# Patient Record
Sex: Male | Born: 1935 | Race: White | Hispanic: No | Smoking: Former smoker
Health system: Southern US, Community
[De-identification: ages and names within clinical notes are randomized; demographics above are authoritative.]

## PROBLEM LIST (undated history)

## (undated) DIAGNOSIS — S73032A Other anterior subluxation of left hip, initial encounter: Secondary | ICD-10-CM

## (undated) DIAGNOSIS — R413 Other amnesia: Secondary | ICD-10-CM

## (undated) DIAGNOSIS — Z8546 Personal history of malignant neoplasm of prostate: Secondary | ICD-10-CM

## (undated) DIAGNOSIS — M1990 Unspecified osteoarthritis, unspecified site: Secondary | ICD-10-CM

## (undated) DIAGNOSIS — I209 Angina pectoris, unspecified: Secondary | ICD-10-CM

## (undated) DIAGNOSIS — Z951 Presence of aortocoronary bypass graft: Secondary | ICD-10-CM

## (undated) DIAGNOSIS — F419 Anxiety disorder, unspecified: Secondary | ICD-10-CM

## (undated) DIAGNOSIS — D375 Neoplasm of uncertain behavior of rectum: Secondary | ICD-10-CM

## (undated) DIAGNOSIS — H9190 Unspecified hearing loss, unspecified ear: Secondary | ICD-10-CM

## (undated) DIAGNOSIS — E785 Hyperlipidemia, unspecified: Secondary | ICD-10-CM

## (undated) DIAGNOSIS — I2510 Atherosclerotic heart disease of native coronary artery without angina pectoris: Principal | ICD-10-CM

## (undated) HISTORY — PX: CORONARY ARTERY BYPASS GRAFT: SHX141

## (undated) HISTORY — DX: Presence of aortocoronary bypass graft: Z95.1

## (undated) HISTORY — DX: Neoplasm of uncertain behavior of rectum: D37.5

## (undated) HISTORY — PX: COLONOSCOPY: SHX174

## (undated) HISTORY — PX: OTHER SURGICAL HISTORY: SHX169

## (undated) HISTORY — DX: Hyperlipidemia, unspecified: E78.5

## (undated) HISTORY — PX: BACK SURGERY: SHX140

## (undated) HISTORY — DX: Atherosclerotic heart disease of native coronary artery without angina pectoris: I25.10

## (undated) HISTORY — PX: HERNIA REPAIR: SHX51

## (undated) HISTORY — PX: PROSTATE CRYOABLATION: SUR358

## (undated) HISTORY — DX: Anxiety disorder, unspecified: F41.9

## (undated) HISTORY — PX: HIP SURGERY: SHX245

## (undated) HISTORY — PX: NASAL SEPTUM SURGERY: SHX37

## (undated) HISTORY — DX: Personal history of malignant neoplasm of prostate: Z85.46

---

## 1998-08-20 ENCOUNTER — Ambulatory Visit (HOSPITAL_COMMUNITY): Admission: RE | Admit: 1998-08-20 | Discharge: 1998-08-20 | Payer: Self-pay | Admitting: Cardiology

## 1998-09-04 ENCOUNTER — Encounter: Payer: Self-pay | Admitting: Cardiothoracic Surgery

## 1998-09-07 ENCOUNTER — Encounter: Payer: Self-pay | Admitting: Cardiothoracic Surgery

## 1998-09-07 ENCOUNTER — Inpatient Hospital Stay (HOSPITAL_COMMUNITY): Admission: RE | Admit: 1998-09-07 | Discharge: 1998-09-11 | Payer: Self-pay | Admitting: Cardiothoracic Surgery

## 1998-09-08 ENCOUNTER — Encounter: Payer: Self-pay | Admitting: Cardiothoracic Surgery

## 1998-09-09 ENCOUNTER — Encounter: Payer: Self-pay | Admitting: Cardiothoracic Surgery

## 2002-09-24 ENCOUNTER — Encounter: Payer: Self-pay | Admitting: Internal Medicine

## 2002-09-24 ENCOUNTER — Encounter: Admission: RE | Admit: 2002-09-24 | Discharge: 2002-09-24 | Payer: Self-pay | Admitting: Internal Medicine

## 2003-01-29 ENCOUNTER — Encounter: Admission: RE | Admit: 2003-01-29 | Discharge: 2003-01-29 | Payer: Self-pay | Admitting: Internal Medicine

## 2003-01-29 ENCOUNTER — Encounter: Payer: Self-pay | Admitting: Internal Medicine

## 2003-09-05 ENCOUNTER — Encounter: Admission: RE | Admit: 2003-09-05 | Discharge: 2003-09-05 | Payer: Self-pay | Admitting: Endocrinology

## 2004-03-04 ENCOUNTER — Inpatient Hospital Stay (HOSPITAL_COMMUNITY): Admission: EM | Admit: 2004-03-04 | Discharge: 2004-03-06 | Payer: Self-pay | Admitting: Emergency Medicine

## 2005-07-04 HISTORY — PX: PARTIAL COLECTOMY: SHX5273

## 2005-07-16 ENCOUNTER — Inpatient Hospital Stay (HOSPITAL_COMMUNITY): Admission: EM | Admit: 2005-07-16 | Discharge: 2005-07-29 | Payer: Self-pay | Admitting: Emergency Medicine

## 2005-08-11 ENCOUNTER — Encounter: Admission: RE | Admit: 2005-08-11 | Discharge: 2005-08-11 | Payer: Self-pay | Admitting: Cardiology

## 2005-08-25 ENCOUNTER — Encounter (HOSPITAL_COMMUNITY): Admission: RE | Admit: 2005-08-25 | Discharge: 2005-09-29 | Payer: Self-pay | Admitting: Cardiology

## 2005-12-26 ENCOUNTER — Encounter (INDEPENDENT_AMBULATORY_CARE_PROVIDER_SITE_OTHER): Payer: Self-pay | Admitting: *Deleted

## 2005-12-26 ENCOUNTER — Inpatient Hospital Stay (HOSPITAL_COMMUNITY): Admission: RE | Admit: 2005-12-26 | Discharge: 2005-12-30 | Payer: Self-pay | Admitting: General Surgery

## 2007-10-24 ENCOUNTER — Ambulatory Visit (HOSPITAL_BASED_OUTPATIENT_CLINIC_OR_DEPARTMENT_OTHER): Admission: RE | Admit: 2007-10-24 | Discharge: 2007-10-24 | Payer: Self-pay | Admitting: Urology

## 2007-12-20 ENCOUNTER — Ambulatory Visit (HOSPITAL_COMMUNITY): Admission: RE | Admit: 2007-12-20 | Discharge: 2007-12-20 | Payer: Self-pay | Admitting: Gastroenterology

## 2010-04-22 ENCOUNTER — Ambulatory Visit (HOSPITAL_COMMUNITY)
Admission: RE | Admit: 2010-04-22 | Discharge: 2010-04-23 | Payer: Self-pay | Source: Home / Self Care | Admitting: Cardiology

## 2010-05-20 ENCOUNTER — Ambulatory Visit: Payer: Self-pay | Admitting: Cardiothoracic Surgery

## 2010-05-25 ENCOUNTER — Encounter (HOSPITAL_COMMUNITY)
Admission: RE | Admit: 2010-05-25 | Discharge: 2010-06-18 | Payer: Self-pay | Source: Home / Self Care | Attending: Cardiology | Admitting: Cardiology

## 2010-07-25 ENCOUNTER — Encounter: Payer: Self-pay | Admitting: Cardiothoracic Surgery

## 2010-09-15 LAB — CBC
Hemoglobin: 12.3 g/dL — ABNORMAL LOW (ref 13.0–17.0)
MCH: 30.1 pg (ref 26.0–34.0)
RBC: 4.09 MIL/uL — ABNORMAL LOW (ref 4.22–5.81)
WBC: 4.7 10*3/uL (ref 4.0–10.5)

## 2010-09-15 LAB — BASIC METABOLIC PANEL
GFR calc Af Amer: >60 mL/min (ref >60)
GFR calc non Af Amer: 56 mL/min — ABNORMAL LOW (ref >60)

## 2010-11-16 NOTE — Assessment & Plan Note (Signed)
OFFICE VISIT   [name], [name]  DOB:  1926/11/10                                        May 21, 2010  CHART #:  [id number]   HISTORY:  The patient comes to the office today at the request of Dr.  Donnie Aho and Dr. Verdis Prime for evaluation of progressive coronary  occlusive disease.  The patient is an 77 year old male who in 2000 had  coronary artery bypass grafting and did well until 2007, when he  underwent redo coronary artery bypass grafting x3 with a sequential  reverse saphenous vein graft to the posterior descending and  posterolateral branches of the right coronary artery and reverse  saphenous vein graft to the intermediate coronary artery with left leg  endovein harvesting.  His initial operation was performed on September 07, 1998, and at that time, he had coronary artery bypass grafting x4 with  left internal mammary to the left anterior descending coronary artery,  reverse saphenous vein graft to the intermediate coronary artery,  sequential reverse saphenous vein graft to the posterior descending and  posterolateral branches of the right coronary artery with endovein  harvesting from the right thigh.  In October, he presented with  increasing chest pain and underwent repeat intervention by Dr. Katrinka Blazing  including angioplasty of some narrowing in the vein to the intermediate.  He had lost the segment of graft between the PD and the PL and had a  narrowing at the anastomosis of the vein graft to the PD.  Both the  distal vessel and the posterolateral are relatively small diffusely  diseased vessels.  No intervention was undertaken for technical reasons  on the right vein graft.  He has been followed by Dr. Donnie Aho, who is  working on maximizing his medical therapy, and comes to see me today to  discuss the risks and options involved in the third time redo.  He notes  he does not have chest pain, but appears that his anginal equivalent is  pain in  both shoulders usually with exercise.  At times, limiting and  other times, he is able to in fact yesterday climb on his roof and clean  his gutters.   PAST MEDICAL HISTORY:  As noted above, myocardial infarction in January  2007; coronary artery disease as noted above; history of hyperlipidemia;  glaucoma; history of prostate cancer, unknown stage; dysphagia with  history of food impactions; adenomatous polyps; history of colon cancer  requiring resection; and hemorrhoidectomy.   FAMILY AND SOCIAL HISTORY:  The patient's father died at 96 with  prostate cancer, mother died at 73 of lung cancer, one brother died at  52 of CVA, one sister at 35 is alive.  The patient has a history of  smoking, but has not smoked for many years.  Rare use of alcohol.  He is  married and with supportive wife.   ALLERGIES:  All statins cause muscle cramps and is unable to tolerate.   REVIEW OF SYSTEMS:  Denies fever, chills, or night sweats.  Denies  claudication.  Denies edema.  Denies orthopnea.  Denies GI bleeding.  Denies dyspnea on exertion or hemoptysis.  Denies any joint difficulty.   PHYSICAL EXAMINATION:  Vital Signs:  His blood pressure 116/64, pulse 58  and regular, respiratory rate is 16, and O2 sats 99%.  BMI  is 28.  General:  The patient appears younger than his stated age.  He is awake,  alert, neurologically intact and able to relate his history in good  detail.  He is in no distress in the office.  Neck:  I do not appreciate  any carotid bruits.  Cardiac:  Regular rate and rhythm without murmur or  gallop.  Abdomen:  Benign without palpable masses.  Extremities:  He has  endovein harvest incisions involving both thighs, appears to have  adequate vein in both lower extremities that could be used for bypass.   I have reviewed the current coronary angiogram films and discussed them  with both Dr. Katrinka Blazing and Dr. Donnie Aho, is native.  A good result was  obtained from the dilatation of the vein  graft to the intermediate.  He  has severe diffuse native coronary disease.  The internal mammary is  widely patent to the LAD.  As described, the vein graft to the right is  widely patent until it gets to the anastomosis site with a small  posterior descending branch where it narrows and then the sequential  segment of the graft is occluded to the posterolateral.  There is  filling antegrade and retrograde through the PD and PL.  The native  right has 80-90% proximal stenosis, impedes several acute marginals.   IMPRESSION:  The patient with progressive vein graft and native coronary  disease for consideration of third time redo, also a history of  dyslipidemia, but unable to tolerate statins.  After reviewing the films  and discussing with Dr. Donnie Aho and Dr. Katrinka Blazing, a third time redo would  carry significant risk because of the small and diffuse disease both in  the PD and the PL.  The technical outcome of the third time redo surgery  may be less than optimal.  It does not appear that angioplasty or  stenting of the anastomotic site to the PD would have the same  limitations.  I agree with Dr. Katrinka Blazing and Dr. Donnie Aho that at this point,  the best treatment for the patient would be to maximize his medical  therapy.  He has just started on nitrates, although the results with  Imdur are not ideal that could also be a potential benefit.  Only if he  completely fails medical therapy, the next approach could be to consider  angioplasty on the native right coronary artery, but the appearance of  this would not be an ideal situation and I am not sure how much it would  help even if it was carried out.  I will keep in contact with Dr. Donnie Aho  about the patient and his progress  with continue with medical therapy.  I have discussed the risks and  options with the patient in detail and he is willing to proceed in this  manner.   Sheliah Plane, MD  Electronically Signed   [name]  D:  05/21/2010   T:  05/22/2010  Job:  [id number]   cc:   Lyn Records, M.D.  Georga Hacking, M.D.  Gwen Pounds, MD

## 2010-11-16 NOTE — Op Note (Signed)
NAME:  [name], [name]                     ACCOUNT NO.:  [id number]   MEDICAL RECORD NO.:  [id number]          PATIENT TYPE:  AMB   LOCATION:  [facility]                         FACILITY:  [facility]   PHYSICIAN:  Sigmund I. Patsi Sears, M.D.DATE OF BIRTH:  [birth year]   DATE OF PROCEDURE:  10/24/2007  DATE OF DISCHARGE:                               OPERATIVE REPORT   PREOPERATIVE DIAGNOSIS:  T1C adenocarcinoma of the prostate.   POSTOPERATIVE DIAGNOSIS:  T1C adenocarcinoma of the prostate.   OPERATION:  Cryotherapy of the prostate.   SURGEON:  Sigmund I. Patsi Sears, M.D.   ANESTHESIA:  General LMA and suprapubic tube insertion.   PREPARATION:  After appropriate preanesthesia, the patient is brought to  the operating room, placed on the operating table in the dorsal supine  position where general LMA anesthesia was introduced.  He was then  replaced in dorsal lithotomy position where the pubis was prepped with  Betadine solution and draped in usual fashion.   BRIEF HISTORY:  This 77 year old male was found to have T1C  adenocarcinoma of the prostate, Gleason 3 + 3 in the right lateral  prostate, and Gleason 3 + 3 at the apex.  The gland was shrunk from 73  mL to 50 mL, and after all options were discussed, the patient  selected  cryotherapy as treatment of choice.   PROCEDURE:  Cystourethroscopy was accomplished while the patient is in  dorsal lithotomy position, and the penis is prepped with Betadine  solution and draped in usual fashion.  The bladder was filled with  fluid.  There is no evidence of abnormality of the prostate.  There is  minimal trabeculation, no cellule formation.  There is no evidence of  bladder stone or tumor formation within the bladder.  Clear reflux is  seen from both orifices, which are located on a normally positioned  trigone.   With the bladder with approximately 40 mL of sterile water within it,  and the patient in Trendelenburg position, a 5 mm incision is made  in  the suprapubic area, midline, between the umbilicus in the pubis.  Subcutaneous tissue was dissected with a Kelly clamp, and suprapubic  tube is placed without difficulty.  Cystoscopy shows that the curl was  in the right position  at the dome of the bladder.  The catheter is both secured in place with  the suture on the catheter itself, as well as with the special plastic  ring, placed at the base of the catheter.   Foley catheter is placed with 10 mL in the balloon, and the scrotum was  prepped and draped, and draped out of the field.   The prostate ultrasound probe was then placed, and needle placement was  then accomplished, placed with placement of cryoprobes in three rows.  In addition, probes were placed at the external sphincter, and  Denonvilliers' fascia.  Following this, the patient underwent two freeze-  thaw cycles, with attention to temperature at the sphincter, and also  the Denonvilliers' fascia probe.  Following two freeze-thaw cycles, the  probes were removed.  It is noted that a urethral warming device was  placed after the needles placed, and after cystoscopy showed that there  were no needles within the prostatic fossa or the bladder.  A guidewire  was placed, and the urethral warming device was placed over the  guidewire.  This was all accomplished prior to the first freeze-thaw  cycle.   The urethral warming mechanism was left in place for 20 minutes after  the final thaw, and the patient was given a B & O suppository, IV  Toradol, awakened and taken to the recovery room in good condition.      Sigmund I. Patsi Sears, M.D.  Electronically Signed     SIT/MEDQ  D:  10/24/2007  T:  10/24/2007  Job:  [id number]   cc:   Gwen Pounds, MD  Fax: [phone]

## 2010-11-19 NOTE — Cardiovascular Report (Signed)
[name], [name]                     ACCOUNT NO.:  [id number]   MEDICAL RECORD NO.:  [id number]          PATIENT TYPE:  INP   LOCATION:  2926                         FACILITY:  [facility]   PHYSICIAN:  Georga Hacking, M.D.DATE OF BIRTH:  1927/02/14   DATE OF PROCEDURE:  07/18/2005  DATE OF DISCHARGE:                              CARDIAC CATHETERIZATION   PROCEDURE:  1.  Cardiac catheterization with coronary angiograms.  2.  Left ventriculogram.  3.  angiograms of the saphenous vein grafts and internal mammary artery.   HISTORY:  A 77 year old male with previous coronary bypass grafting in March  of 2000.  He had a CYPHER stent of the distal saphenous vein graft to the  right coronary artery in September of 2000 and has done well until the past  couple of weeks when he has had recurrent shoulder discomfort. He had a  prolonged episode over the weekend and was placed on treatment with  Integrilin, Lovenox and is admitted to the hospital for further evaluation.   PROCEDURE:  The procedure was done through the right femoral artery using a  single anterior needle wall stick.  Angiograms were done using 6-French  catheters. The right coronary artery graft was selected using the standard  right catheter. The internal mammary and obtuse marginal graft were selected  using the internal mammary artery catheter.  A 30 mL ventriculogram was  performed.   Following the procedure a right femoral artery angiogram was performed  showing the artery to be suitable for percutaneous closure and this was done  with Angio-Seal without difficulty. He tolerated the procedure well.   HEMODYNAMIC DATA:  Aorta postcontrast 111/61, LV postcontrast 111/0 to 22.   ANGIOGRAPHIC DATA:  Left ventriculogram: Performed in the 30 degrees RAO  projection. The aortic valve was normal. The mitral valve was normal.  The  left ventricle appears normal in size. Estimated ejection fraction is 60%.  Coronary arteries rise to  normally. Calcifications noted involving the left  and the right coronary artery system. The left main coronary artery appeared  normal. The left anterior descending has severe disease proximally, has a  severe stenosis prior to the septal perforator and is then totally occluded.  The circumflex coronary artery has a large intermediate branch and has a  long segmental stenosis in its proximal portion. This vessel previously  filled by a patent saphenous vein graft. A marginal branch of the circumflex  arises and is somewhat small. The right coronary artery is occluded in its  mid portion after two acute marginal branches. The saphenous vein graft to  the right coronary artery that was previously patent is now occluded  proximally, much more proximal to the site of where the stent was put in.  The saphenous vein graft to the intermediate branch is now also completely  occluded. The internal mammary graft to LAD is widely patent.   IMPRESSION:  1.  Severe native three-vessel coronary artery disease.  2.  Interval occlusion of the saphenous vein graft to the right coronary      artery with  collateral filling from the native left coronary artery.  3.  Interval occlusion of saphenous vein graft to the intermediate branch.  4.  Patent internal mammary graft to LAD.  5.  Successful Angio-Seal closure of the right femoral artery.   RECOMMENDATIONS:  The patient will be considered for re-do coronary bypass  grafting.      Georga Hacking, M.D.  Electronically Signed     WST/MEDQ  D:  07/18/2005  T:  07/18/2005  Job:  [id number]   cc:   Dr. Dimitri Ped

## 2010-11-19 NOTE — Op Note (Signed)
NAMEASBURY, HAIR                     ACCOUNT NO.:  [id number]   MEDICAL RECORD NO.:  [id number]          PATIENT TYPE:  INP   LOCATION:  1415                         FACILITY:  [facility]   PHYSICIAN:  Angelia Mould. Derrell Lolling, M.D.DATE OF BIRTH:  March 20, 1927   DATE OF PROCEDURE:  12/26/2005  DATE OF DISCHARGE:                                 OPERATIVE REPORT   PREOPERATIVE DIAGNOSIS:  Villous adenoma of the ascending colon.   POSTOPERATIVE DIAGNOSIS:  Villous adenoma of the ascending colon.   OPERATION PERFORMED:  Laparoscopic-assisted right colectomy.   SURGEON:  Angelia Mould. Derrell Lolling, M.D.   FIRST ASSISTANT:  Ollen Gross. Carolynne Edouard, M.D.   OPERATIVE INDICATIONS:  This is a 77 year old white man with coronary artery  disease and hyperlipidemia.  He was noted have Hemoccult-positive stools and  a hemoglobin of between 10 and 11.  Dr. Matthias Hughs performed a colonoscopy and  he found a 5-cm diameter flat sessile villous adenoma in the mid ascending  colon.  Biopsies showed no dysplasia to the adenoma.  The adenoma was too  large for colonoscopic resection.  He had some other smaller polyps that  were removed.  He was offered elective colon resection for this lesion and  he agreed to that.  He has undergone a 2-day bowel prep at home.  He has  been on beta blockers for 5 days preop.  He is brought to the operating room  electively.   OPERATIVE FINDINGS:  In the right colon, I was able to visualize the [place]  ink tattoo in the proximal ascending colon.  Once I had the specimen out, I  could palpate a flat nodular polyp, which felt benign.  Once the specimen  was removed, it was sent to the lab and Dr. Jimmy Picket examined it grossly  and said that in the area of the [place] ink, he did see a residual sessile  adenoma and findings consistent with recent biopsy.  The liver looked  normal.  The gallbladder looked normal.  The appendix and terminal ileum  looked normal.  There was no ascites.   OPERATIVE TECHNIQUE:   Following the induction of general endotracheal  anesthesia, a Foley catheter was inserted.  Intravenous antibiotics were  given prior to the incision.  The abdomen was prepped and draped in a  sterile fashion.  0.5% Marcaine with epinephrine was used as a local  infiltration anesthetic.  A 10-mm Optivu port was placed in the left upper  quadrant without any difficulty.  Pneumoperitoneum was created.  The video  camera was inserted with visualization and findings, as described above.  Specifically, there was no trauma at the insertion site.  A 5-mm trocar was  placed in the midline, about 6 cm below the umbilicus.  A 5-mm trocar was  placed in the midline, just above the umbilicus, and ultimately, we had to  place another 5-mm trocar in the subxiphoid region.  We identified the  terminal ileum and the ileocecal valve.  Using the harmonic scalpel, we  mobilized the terminal ileum and appendix and cecum, mobilizing it  away from  the lateral abdominal wall by dividing the white line of Toldt.  We  continued this dissection by dividing all the lateral peritoneal  attachments, rotating the terminal ileum and right colon and hepatic flexure  medially.  We then took down the transverse colon.  There were moderate  adhesions of the transverse colon to the lower edge of the liver, but we  slowly took these down without any problem.  We were able to mobilize the  hepatic flexure and the right transverse colon down and off of the duodenum  under direct vision.  Once we had done all of this, we found that we had an  extremely mobile right colon.  There was no bleeding.  There was no injury  to the gallbladder or liver or duodenum.   The pneumoperitoneum was released.  We made about an 8-cm vertically-  oriented incision just above the umbilicus.  The fascia was incised in the  midline and the abdominal cavity entered under direct vision.  A wound  protector was placed in the wound.  The cecum was  delivered into the wound  and we were able to easily mobilize the terminal ileum and the right colon  and the transverse colon through this wound.  We were able to visualize the  [place] ink and placed a silk suture in that.  We transected the terminal  ileum with a GIA stapling device.  We placed stay sutures of 3-0 silk in the  terminal ileum to prevent twisting of the specimen.  We took down the  mesenteric vessels between clamps, either singly or doubly ligating  mesenteric vessels with 2-0 silk ties.  We took the dissection probably 6 cm  or more distal to the [place] ink, and then transected the right transverse  colon with the GIA stapling device.  We had one bleeder in the mesentery,  which was suture ligated with 2-0 silk suture ligature and this controlled  the bleeding quite nicely.  The specimen was sent to Dr. Jimmy Picket in the  lab and he opened it and grossly inspected it and found that the villous  adenoma was within the specimen with good margins.   Anastomosis was created between the terminal ileum and the mid transverse  colon using a GIA stapling device.  The bowel was placed side-to-side and  the stapler fired through enterotomies.  We then removed the stapler and  examined the lumen of the anastomosis and found that the tissues were quite  pink and viable and that there was no bleeding.  The defect in the bowel  wall was closed with a TA-60 stapler.   We then changed our instruments and gloves.  We inspected the anastomosis  and found it to be widely patent with a good-looking staple line all around.  We placed some additional sutures of 3-0 silk at the staple line in critical  points.  We did not close the mesentery, as we felt we were going to have to  pull on the specimen too hard for that.  We irrigated the specimen and then  dropped it back in the abdominal cavity.  We then looked around the abdominal cavity and saw no bleeding.  The midline fascia was closed with  a  running suture of #1 PDS, the wound irrigated, and the skin closed with skin  staples.  We re-insufflated the abdomen, inserted the video camera, and  looked around the pelvis, in the right paracolic gutter and the  liver, and  found no bleeding or any other abnormality.  The trocars were removed under  direct vision.  There was no bleeding from the trocar sites.  The  pneumoperitoneum was released.  The fascia at all of the wounds were closed,  as described above, and the skin incisions were closed with skin staples.  Clean bandages were placed and the patient was taken to the recovery room in  stable condition.  Estimated blood loss was probably about 50-70 mL.  Complications:  None.  Sponge and instrument counts were correct.      Angelia Mould. Derrell Lolling, M.D.  Electronically Signed     HMI/MEDQ  D:  12/26/2005  T:  12/26/2005  Job:  [id number]   cc:   Bernette Redbird, M.D.  Fax: [phone]   W. Viann Fish, M.D.  Fax: [phone]  Email: [email or web address]   Gwen Pounds, MD  Fax: [phone]

## 2010-11-19 NOTE — Discharge Summary (Signed)
NAMERICHRD, KUZNIAR                     ACCOUNT NO.:  [id number]   MEDICAL RECORD NO.:  [id number]          PATIENT TYPE:  INP   LOCATION:  1415                         FACILITY:  [facility]   PHYSICIAN:  Angelia Mould. Derrell Lolling, M.D.DATE OF BIRTH:  [birth year]   DATE OF ADMISSION:  12/26/2005  DATE OF DISCHARGE:  12/30/2005                                 DISCHARGE SUMMARY   FINAL DIAGNOSES:  1.  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  2.  Coronary artery disease status post coronary artery bypass grafting.  3.  Hyperlipidemia.   OPERATIONS PERFORMED:  Laparoscopic-assisted right colectomy date December 26, 2005.   HISTORY:  This is a 77 year old gentleman who was found to have hemoccult-  positive stools.  A colonoscopy was performed and somewhere in the mid  descending colon Dr. Matthias Hughs found a 5 cm flat sessile adenoma which could  not be removed colonoscopically.  The biopsy showed adenomatous change, but  no dysplasia.  The patient had no other symptoms.  The patient was referred  for segmental resection.  As an outpatient the patient was evaluated.  His  care was discussed Dr. Viann Fish who felt that he was fit and cleared  from a cardiac standpoint for the surgery.  He placed him on beta blockers  for five days preoperative.   PHYSICAL EXAMINATION:  GENERAL:  Pleasant older gentleman who appeared fit.  VITAL SIGNS:  Height 5 feet 8.  Weight 173.  LUNGS:  Clear.  NECK:  No adenopathy.  ABDOMEN:  Soft and nontender.  No palpable mass.  Liver and spleen not  enlarged.  No inguinal adenopathy.  No hernia.  EXTREMITIES:  Well-healed right groin scar from previous hernia.   HOSPITAL COURSE:  On the date of admission the patient was taken to the  operating room and underwent an uneventful laparoscopic right colectomy with  primary anastomosis.  The final pathology report showed tubular adenoma with  focal high-grade dysplasia.  14 lymph nodes were evaluated and all 14  were  benign.   Postoperatively the patient did well.  He  progressed in his diet and  activities as tolerated.  He was maintained on beta blockers and DVT  prophylaxis.  Hemoglobin on postoperative day one was 8.8 which was slightly  down from his preoperative hemoglobin of 10.5.  On the following days June  27 his hemoglobin had come back up to 9.4 and neither Dr. Donnie Aho nor I felt  that he needed a transfusion.  He mobilized well, had no symptoms, no  fatigue, no shortness of breath.  Progressed in his diet and activities, did  well, and was ready for discharge on December 30, 2005.  His pathology report  was discussed with him.  Roll of screening colonoscopy in the future was  discussed with him.  Diet and activities were discussed.  Staples were  removed from his wounds and Steri-Strips were applied.  He had  no wound problems.  He was told to continue his usual medications and to  continue taking Toprol per Dr.  Tilley's instructions and he was given  prescription for Vicodin for pain.  He was asked to follow up with me in the  office in 10-14 days.      Angelia Mould. Derrell Lolling, M.D.  Electronically Signed     HMI/MEDQ  D:  01/31/2006  T:  01/31/2006  Job:  [id number]   cc:   Georga Hacking, M.D.  Fax: [phone]  Email: [email or web address]   Bernette Redbird, M.D.  Fax: [phone]   Gwen Pounds, MD  Fax: [phone]

## 2010-11-19 NOTE — Op Note (Signed)
NAMEPERL, KERNEY                     ACCOUNT NO.:  [id number]   MEDICAL RECORD NO.:  [id number]          PATIENT TYPE:  INP   LOCATION:  2310                         FACILITY:  [facility]   PHYSICIAN:  Sheliah Plane, MD    DATE OF BIRTH:  [birth year]   DATE OF PROCEDURE:  07/25/2005  DATE OF DISCHARGE:                                 OPERATIVE REPORT   PREOPERATIVE DIAGNOSIS:  Recurrent coronary occlusive disease with unstable  angina.   POSTOPERATIVE DIAGNOSIS:  Recurrent coronary occlusive disease with unstable  angina.   SURGICAL PROCEDURE:  Redo coronary artery bypass grafting x3 with a  sequential reversed saphenous vein graft to the posterior descending and  posterolateral branches of the right coronary artery and reversed saphenous  vein graft to the intermediate coronary artery with left leg endo-vein  harvesting.   SURGEON:  Sheliah Plane, MD   FIRST ASSISTANT:  Constance Holster, [place]   BRIEF HISTORY:  The patient is a 77 year old male who 7 years previously had  undergone coronary artery bypass grafting and initially had done well, but  recently was readmitted with recurrent angina.  Repeat cardiac  catheterization demonstrated total occlusion of the vein graft to the PD and  PL, and occlusion of the vein graft to the intermediate.  The patient had a  small nondominant circumflex system that was not bypassed.  His mammary  artery was intact.  Ejection fraction was preserved.  The patient was on  Plavix and had fall in hematocrit presumably from GI bleeding; his Plavix  was held and he was allowed to wash out prior to proceeding with bypass  surgery.  The patient agreed and signed informed consent.   DESCRIPTION OF PROCEDURE:  With Swan-Ganz and arterial line monitors in  place, the patient underwent general endotracheal anesthesia without  incident.  Skin of the chest and legs was prepped with Betadine and draped  in the usual sterile manner.  Vein was harvested from the  left thigh with  Guidant endo-vein harvesting system and was of good quality and caliber.  A  median sternotomy was performed with a sagittal saw, taking care not to  injure any underlying structures.  The ascending aorta and the right atrium  were dissected out from dense adhesions.  The patient was systemically  heparinized.  The ascending aorta and the right atrium were cannulated.  A  retrograde cardioplegia catheter was placed.  The patient was placed on  cardiopulmonary bypass and the remainder of the left ventricle was dissected  out.  The mammary artery was identified and carefully preserved and  encircled to allow a bulldog clamp during crossclamp application.  The  patient's body temperature was cooled to 32 degrees.  An aortic cross-clamp  was applied and 500 mL of cold blood potassium cardioplegia was administered  antegrade.  A bulldog was placed on the mammary artery and additional cold  blood cardioplegia was administered retrograde.  Attention was turned first  to the intermediate coronary artery, which was high on the lateral wall and  partially intramyocardial.  The vessel  was identified, opened and admitted a  1.5-mm probe.  Using a running 7-0 Prolene, a distal anastomosis was  performed with a segment of reversed saphenous vein graft.  Attention was  then turned to the PD and PL; each of these were relatively small branches,  but were opened and using a diamond-type side-to-side anastomosis, a running  7-0 anastomosis was performed with the vein graft to the posterior  descending.  The distal extent of the same vein was then carried out to the  posterolateral branch of the right coronary artery, which was opened and was  also a small vessel, but admitted a 1.5-mm probe.  Using a running 7-0  Prolene, distal anastomosis was performed.  Additional cold blood  cardioplegia was administered down vein grafts.  With the crossclamp still  in place, 2 punch aortotomies were  performed, each in the hoods of the old  vein grafts.  Each of the proximal anastomoses were completed and before  closure of the final anastomosis, the heart was allowed to passively fill  and de-air the heart.  The aortic cross-clamp was removed with a total cross-  clamp time of 71 minutes.  Sites of anastomoses were inspected.  The  patient's body temperature had rewarmed to 37 degrees.  He was then  ventilated and weaned from cardiopulmonary bypass without difficulty.  He  remained hemodynamically stable.  He was decannulated in the usual fashion.  Total pump time was 108 minutes.  The atrial and ventricular pacing wires  were applied.  The mediastinal tissue was closed over the ascending aorta  and the right ventricle.  The sternum was closed with #6 stainless steel  wire.  Fascia was closed using interrupted 0 Vicryl, running 3-0 Vicryl in  the subcutaneous tissue and 4-0 subcuticular stitch in the skin edges.  Dry  dressings were applied.  Sponge and needle count was reported as correct at  completion of the procedure.  The patient tolerated the procedure without  obvious complication and was transferred to the surgical intensive care unit  for further postoperative care.      Sheliah Plane, MD  Electronically Signed     [name]  D:  07/27/2005  T:  07/27/2005  Job:  [id number]   cc:   Georga Hacking, M.D.  Fax: [phone]  Email: [email or web address]

## 2010-11-19 NOTE — H&P (Signed)
[name], [name]                           ACCOUNT NO.:  [id number]   MEDICAL RECORD NO.:  [id number]                   PATIENT TYPE:  EMS   LOCATION:  MAJO                                 FACILITY:  [facility]   PHYSICIAN:  Vesta Mixer, M.D.              DATE OF BIRTH:  07/18/1926   DATE OF ADMISSION:  03/04/2004  DATE OF DISCHARGE:                                HISTORY & PHYSICAL   [name] is a 77 year old gentleman with a history of coronary artery  disease.  He is admitted now with episodes of unstable angina.  The patient  has a history of coronary artery disease.  He is status post angioplasty in  1994 or 1995.  He then had coronary artery bypass grafting in 2000.  He has  done well since that time.  He has not had any episodes of chest pain.  Yesterday while playing golf, he started having some episodes of left  shoulder pain.  This was his angina equivalent.  The pain resolved when he  rested.  The pain returned later that night, and he called EMS.  The pain  was ultimately relieved with nitroglycerin.  He now presents to the  emergency room for further evaluation.  He is currently not having any  significant pain.   CURRENT MEDICATIONS:  None.   ALLERGIES:  None.   PAST MEDICAL HISTORY:  History of coronary artery disease.   SOCIAL HISTORY:  The patient quit smoking in 1975.   FAMILY HISTORY:  Negative.   REVIEW OF SYSTEMS:  Negative.   PHYSICAL EXAMINATION:  GENERAL:  He is an elderly gentleman, no acute  distress.  VITAL SIGNS:  His heart rate is 60.  His blood pressure is 124/70.  HEENT:  Reveals 2+ carotids.  He has no bruits.  There is no JVD.  No  thyromegaly.  LUNGS:  Clear to auscultation.  HEART:  Regular rate.  S1 S2 with no murmurs, gallops or rubs.  ABDOMEN:  Reveals good bowel sounds.  It is nontender.  EXTREMITIES:  He has no clubbing, cyanosis, or edema.  NEURO:  Nonfocal.   His EKG reveals a sinus bradycardia.  He has T wave inversions in  the  lateral leads.  There is no other changes and there are no acute changes.   His laboratory data reveals a hemoglobin of 11.6.  A sodium of 142,  potassium at 3.5, chloride of 111, glucose of 122, BUN of 18.  His CPK-MB  was 1.0 with a troponin of less than 0.05.   [name] presents with episodes consistent with unstable angina.  He has a  return of chest pain, relieved with nitroglycerin.  We will admit him to the  hospital and collect cardiac enzymes.  We will keep him NPO for tomorrow for  a possible heart catheterization.  The patient will be seen by Dr. Donnie Aho  and a  final decision regarding cath will be made at that time.                                                Vesta Mixer, M.D.    PJN/MEDQ  D:  03/04/2004  T:  03/04/2004  Job:  [id number]   cc:   [name]., M.D.  [address], [address]  [place]  [place] [address]  Fax: [phone]

## 2010-11-19 NOTE — Discharge Summary (Signed)
[name], [name]                     ACCOUNT NO.:  [id number]   MEDICAL RECORD NO.:  [id number]          PATIENT TYPE:  INP   LOCATION:  2031                         FACILITY:  [facility]   PHYSICIAN:  Sheliah Plane, MD    DATE OF BIRTH:  [birth year]   DATE OF ADMISSION:  07/16/2005  DATE OF DISCHARGE:  07/29/2005                                 DISCHARGE SUMMARY   HISTORY OF PRESENT ILLNESS:  The patient is a 77 year old male with a  history of coronary artery disease with angioplasty of the right coronary  artery in 1995.  He had a coronary artery bypass with internal mammary  artery with LAD, a vein graft to the diagonal number 1 and a saphenous vein  graft to the posterior descending/posterolateral branch in March of 2000.  He developed unstable angina in August of 2005 and was found to have a  severe distal stenosis of the saphenous vein graft to the right coronary  artery.  He had placement of a Cypher stent in the distal right coronary  artery at that time.  He has done quite well since then without any  symptoms.  Shortly before Christmas he had the onset of shoulder pain which  was similar to, but somewhat different than his previous pain in that it was  somewhat lower down in his arms and it was not radiating to his chest.  It  had occurred while playing golf.  He was able to play golf and exercise  without the pain on some occasions.  Other times he had recurrence of the  pain, also usually while playing golf.  He has discomfort on and off and had  significant discomfort on July 13, 2005 and was seen in Dr. York Spaniel  office.  Again the pain did not radiate into his chest and an EKG was  unremarkable.  At that time he considered taking a cruise.  He was told by  Dr. Donnie Aho that he was unsure if the pain was anginal in nature, but since  it was atypical, they opted to do a Cardiolite.  He cancelled the cruise and  had the onset of discomfort again while sitting at rest in a  chair.  The  pain radiated down his arms and somewhat into his chest.  He took a total of  three nitroglycerin with relief and was brought to [facility] by  EMS where he was pain free.  He was held to require prompt admission for  further evaluation and treatment.   PAST MEDICAL HISTORY:  Also includes:  1.  Hyperlipidemia.  2.  INTOLERANT to all of the agents in treating lipid lowering including      LOPID, STATINS, TRICOR AND ZETIA.   PAST SURGICAL HISTORY:  1.  Coronary artery bypass grafting in March of 2000 by Dr. Tyrone Sage.  2.  Inguinal herniorrhaphy.  3.  Nasal septoplasty.   MEDICATIONS PRIOR TO ADMISSION:  1.  Enteric coated aspirin 81 mg daily.  2.  Fish oil t.i.d.  3.  Garlic daily.  4.  Eye  drops at night.  5.  Niacin 500 mg t.i.d.  6.  Nitroglycerin p.r.n.  7.  Plavix 75 mg daily.  8.  Toprol XL 50 mg daily.   Family history social history, review of systems, physical exam, please see  the history and physical done at the time of admission.   HOSPITAL COURSE:  The patient was admitted with chest pain consistent with  unstable angina.  He was started on Lovenox intravenous, nitroglycerin,  continued on aspirin and Plavix.  He was found to have an anemia with a  hemoglobin of 10 and hematocrit of 30 on initial lab results.  EKG showed no  acute changes.  He did have elevated troponin levels consistent with a sub  endocardial myocardial infarction.  Cardiac catheterization was subsequently  scheduled.   The patient underwent cardiac catheterization on July 18, 2005, where the  following was noted:  A left ventriculogram was normal.  The left main  coronary artery was normal.  The LAD had 100% proximal native stenosis.  The  circumflex had large segmental stenosis in the intermediate proximal and the  right coronary was occluded.  The saphenous vein graft to the right coronary  artery was occluded and the saphenous vein graft to the intermediate was   occluded.  The left internal mammary artery to the LAD was widely patent.  Due to these findings, cardiac surgical opinion was obtained with Dr. Sheliah Plane.  He evaluated the patient and studies and did agree with  recommendations to proceed with redo surgical revascularization.  Of note,  the patient did have a guaiac positive stool on the preoperative phase, but  his hemoglobin and hematocrit remained relatively stable even on Lovenox.  He did not have any official GI evaluation.  The patient was started on  Protonix at that time.  He was deemed to be stable for surgery and they  proceeded on 07/25/05 where the following procedure was done.   Redo coronary artery bypass grafting x 3.  The first graft was a saphenous  vein graft to the posterior descending and the posterior lateral.  The other  graft was a saphenous vein graft to the intermediate coronary artery.  The  patient tolerated the procedure well.  Was taken to the surgical intensive  care unit in stable condition.   POSTOPERATIVE HOSPITAL COURSE:  The patient has done well.  He woke up  neurologically intact.  Was weaned from the ventilator without significant  difficulty.  All routine lines, monitors and drainage devices were  discontinued in the standard fashion.  He did have a postoperative anemia  that was monitored closely.  Hemoglobin and hematocrit on postoperative day  3 were stable at 9.9 and 27.5 respectively.  Electrolytes, BUN and  creatinine are stable.  He does have a postoperative thrombocytopenia with  most recent platelet count of 79,000 and it appears to be stable.  The  patient will not be started on Plavix at this time due to the recent guaiac  positive stools and thrombocytopenia.  His status is felt to be stable for  GI workup as an outpatient.   Overall, the patient continues to do quite well.  He is tolerating routine cardiac rehabilitation, phase I modalities.  Oxygen has been weaned and he   maintains good saturations on room air.  His incisions are all healing  without evidence of infection.  He is responding well to a gentle diuresis.  He remains in normal sinus rhythm without cardiac  dysrhythmias.  Overall his  status is felt to be stable for tentative discharge in the morning of  July 29, 2005, pending morning re-evaluation.   CONDITION ON DISCHARGE:  Stable and improving.   MEDICATIONS ON DISCHARGE:  1.  Aspirin 81 mg daily.  2.  Toprol XL 50 mg daily.  3.  Folic acid 1 mg daily.  4.  Niacin 500 mg 3 x daily.  5.  Lisinopril 10 mg daily.  6.  He is to resume his fish oil initially.  7.  Protonix 40 mg daily.  8.  He is to resume his garlic as well.  9.  For pain Tylox 1-2 every 4-6 hours as needed.   INSTRUCTIONS:  Patient received written instructions in regard to  medication, activity, diet, wound care and follow up.   FOLLOW UP:  1.  Follow up will include Dr. Donnie Aho 2 weeks post discharge.  2.  Dr. Tyrone Sage on September 01, 2005 at 1:50 p.m.   FINAL DIAGNOSES:  Severe recurrent end graft coronary artery disease status  post previous surgical revascularization in 2000.  Now status post redo  revascularization as described above.   OTHER DIAGNOSES:  1.  Previous inguinal hernia repair.  2.  Previous nasoplasty.  3.  Preoperative heme positive stools.  4.  Postoperative and preoperative anemia.  5.  Multiple allergies to cholesterol lowering agents including Lopids,      Statins, Triclor.  He is also allergic to CHINESE RED RICE and YEAST.      Rowe Clack, P.A.-C.      Sheliah Plane, MD  Electronically Signed    WEG/MEDQ  D:  07/28/2005  T:  07/28/2005  Job:  [id number]   cc:   Sheliah Plane, MD  [address]  [place]  [place] [address]   Lacretia Nicks. Viann Fish, M.D.  Fax: [phone]  Email: [email or web address]

## 2010-11-19 NOTE — Cardiovascular Report (Signed)
NAMEJANICE, SEALES                           ACCOUNT NO.:  [id number]   MEDICAL RECORD NO.:  [id number]                   PATIENT TYPE:  INP   LOCATION:  2030                                 FACILITY:  [facility]   PHYSICIAN:  W. Ashley Royalty., M.D.         DATE OF BIRTH:  07-12-1926   DATE OF PROCEDURE:  03/04/2004  DATE OF DISCHARGE:                              CARDIAC CATHETERIZATION   HISTORY:  A 77 year old male with previous bypass grafting who presented  with unstable angina.   PROCEDURE:  Left heart catheterization with coronary angiogram, saphenous  vein graft angiograms and internal mammary artery angiogram, stent placement  of a Cypher drug-eluting stent in the right coronary graft.   DESCRIPTION OF PROCEDURE:  The patient was brought to the catheterization  laboratory and was prepped and draped in the usual manner.  After Xylocaine  anesthesia, a 6 French sheath was placed in the right femoral artery  percutaneously. Angiograms were made using 6 French catheters and a 30 mL  ventriculogram was performed.  He was found to have a severe stenosis  involving the saphenous vein graft to the distal right coronary artery.  Arrangements were made to do stenting of this vessel.  Consulted with Dr.  Shawnie Pons prior to doing the stenting because of the distal nature of a  lesion and a mismatch between the distal vessel and the proximal portion of  the graft.  The sheath was exchanged for a 7 [place] sheath.  Angiomax was  used with bolus and infusion with an ACT of 349.  The patient had previously  been pretreated with Plavix earlier in the day and was given an additional  300 mg of Plavix orally.  A guiding catheter was a 7 French right coronary  bypass catheter.  The patient was given 100 mcg of Verapamil down the vein  graft and then the guide wire was advanced through the lesion.  The lesion  was predilated with a 2.5 mm balloon which was 20 mm in length.  A 3.0 x 23-  mm Cypher drug-eluting stent was then deployed to a maximum of 16  atmospheres.  Additional intracoronary Verapamil was given at this portion  of the test because of some no reflow.  Flow improved and then the lesion  was dilated at the distal end of the stent with a 3.5 mm Quantum balloon to  12 atmospheres.  There were lower pressure inflations done through the stent  and then post dilatation angiograms were obtained.  Additional 100 mcg of  Verapamil was infused and the patient tolerated the procedure well.  He had  some shoulder discomfort prior to the procedure and following the procedure.  He tolerated the procedure well.   HEMODYNAMIC DATA:  1.  Aorta postcontrast:  102/60.  2.  LV postcontrast:  102/8-16.   ANGIOGRAPHIC DATA:   LEFT VENTRICULOGRAM:  Performed in the 30 degree RAO projection.  The aortic  valve was normal.  The mitral valve was normal.  The left ventricle appears  normal in size.  There is mild inferior wall hypokinesis noted.  Estimated  ejection fraction is 60%.  Coronary arteries arise and distribute normally.  No significant coronary calcification noted.  Left main coronary artery is  normal.  Left anterior descending has  complex eccentric 90% stenosis and  the proximal portion of the vessel is then occluded.  The distal fills by  patent internal mammary graft.  There is slow flow down a diagonal branch  that was previously unbypassed.  The circumflex is small and diminutive.  An  intermediate branch has poor forward flow and fills via patent vein graft.  The right coronary artery has a 60% proximal narrowing and then is occluded  in its distal portion after two acute marginal branches.   The saphenous vein graft to the intermediate branch is patent with a  segmental 40-50% mid vessel stenosis.  The internal mammary graft to the LAD  is widely patent with patent proximal distal anastomotic sites.  The  saphenous vein graft to the right coronary artery has  a severe distal  stenosis estimated at 99% with a segmental component for about 20 mm which  is around 60% narrowed.  There is only TIMI-2 grade flow in the distal  vessel.   Post dilatation angiograms of this reveal 0% residual stenosis and now with  TIMI-3 flow.   IMPRESSION:  1.  Successful drug-eluting stent in the distal saphenous vein graft to the      right coronary artery.  We were unable to use distal protection because      there was a short segment and no where to land a filter wire or the      guard wire.  2.  Patent internal mammary graft to the left anterior descending and      saphenous vein graft to the intermediate branch.  3.  Occlusion of left anterior descending, distal right coronary artery and      intermediate branch in the native circulation.                                               [name]., M.D.    WST/MEDQ  D:  03/04/2004  T:  03/06/2004  Job:  [id number]   cc:   Gwen Pounds, M.D.  Fax: [phone]

## 2010-11-19 NOTE — H&P (Signed)
[name], [name]                     ACCOUNT NO.:  [id number]   MEDICAL RECORD NO.:  [id number]          PATIENT TYPE:  INP   LOCATION:  2926                         FACILITY:  [facility]   PHYSICIAN:  Georga Hacking, M.D.DATE OF BIRTH:  1926/09/21   DATE OF ADMISSION:  07/16/2005  DATE OF DISCHARGE:                                HISTORY & PHYSICAL   REASON FOR ADMISSION:  Shoulder pain.   HISTORY:  This 77 year old male has a history of coronary artery disease  with angioplasty of the right coronary artery in 1995.  He had coronary  bypass grafting with an internal mammary graft to the LAD, a vein graft to  the diagonal #1, and a saphenous vein graft to the posterior  descending/posterolateral branch in March 2002.  He developed unstable  angina in August 2005, and was found to have a severe distal stenosis of the  saphenous vein graft to the right coronary artery.  He had placement of a  Cypher stent in the distal right coronary artery at that time, and had done  quite well since then with any symptoms.  Shortly before Christmas, he had  the onset of shoulder pain which was similar too, but somewhat different  than his previous pain in that it was somewhat lower down on his arms and  was not radiating to his chest.  It occurred while playing golf.  He was  able to play golf and exercise without the pain on some occasions, but other  times had recurrence of the pain, usually when playing golf.  He had the  discomfort on and off and had significant discomfort on July 13, 2005, of  7 and was seen in the office.  Again, the pain did not radiate into his  chest, and an EKG was unremarkable.  At this point in time, he was  considering taking a cruise.  I told him I could not tell whether the pain  was anginal in nature or not, but since it was atypical, we opted to start  with Cardiolite.  He cancelled his cruise and had the onset of discomfort  while sitting in his chair at rest today  in his shoulders which then  radiated down into his arms and somewhat into his chest.  He took a total of  three nitroglycerin with relief and is brought here by EMS.  He is not  currently having any chest discomfort.   PAST MEDICAL HISTORY:  Hyperlipidemia which has been intolerant to all of  the agents used to treat lipid-lowering.   PAST SURGICAL HISTORY:  1.  Coronary artery bypass grafting in March 2002, by Dr. Tyrone Sage.  2.  Inguinal herniorrhaphy.  3.  Nasal septoplasty.   ALLERGIES:  He is intolerant to LOPID, STATIN'S, TRICOR, and to ZETIA.   CURRENT MEDICATIONS:  1.  Enteric coated aspirin 81 mg daily.  2.  Fish oil t.i.d.  3.  Garlic daily.  4.  Eye drops at night.  5.  Niacin 500 mg t.i.d.  6.  Nitroglycerin p.r.n.  7.  Plavix  75 mg daily.  8.  Toprol XL 50 mg daily.   SOCIAL HISTORY:  He drinks socially.  He used to smoke, but quit prior to  1980.  He is married and lives with his wife.  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   REVIEW OF SYSTEMS:  He denies any recent weight loss, fatigue, or change in  exercise tolerance.  He wears glasses and has glaucoma.  He has no skin  problems.  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  He has occasional dyspnea on exertion, but no asthma, cough, or  wheezing.  He denies dyspepsia, ulcers, GI bleeding, constipation, or  diarrhea.  He has a history of prostatism with erectile dysfunction and has  used Viagra in the past.  He has recently had aching shoulders and has some  mild generalized arthritis.  He has no history of stroke or TIA.  He has no  history of endocrine problems and no prior history of anemia.  Other than as  noted above, the remainder of the review of systems is unremarkable.   PHYSICAL EXAMINATION:  GENERAL:  He is a pleasant elderly male in no acute  distress.  VITAL SIGNS:  His blood pressure is 124/60, pulse 68.  SKIN:   Warm and dry with scattered hemangiomas.  HEENT:  Normocephalic.  Normal hair pattern.  No mass or tenderness.  ENT:  EOMI.  PERRLA.  ____________clear.  Fundi not examined.  Pharynx negative.  NECK:  Supple without masses, JVD, thyromegaly, or bruits.  LUNGS:  Clear to A&P.  Healed median sternotomy scar is noted.  CARDIAC:  Regular rhythm.  Normal S1 and S2, no S3, S4, murmurs, rubs, or  gallops.  ABDOMEN:  Soft, nontender.  No masses, hepatosplenomegaly, or aneurysm.  EXTREMITIES:  Femoral pulses 2+, posterior tibial pulses 2+, no bruits  auscultated.  He has a healed saphenous vein graft in the right lower  extremity.  NEUROLOGIC:  No motor or sensory deficits.   LABORATORY DATA:  Troponin-I of 0.49, negative myoglobin, and negative MB.  He has a hemoglobin of 10, hematocrit of 30 on initial lab results.  EKG is  not acute.   IMPRESSION:  1.  Chest pain and shoulder pain consistent with unstable angina.  2.  Coronary artery disease.      1.  Previous coronary artery bypass grafting.      2.  Previous stent to the distal saphenous vein graft to the right          coronary artery.      3.  Previous mild to moderate stenosis involving the saphenous vein          graft to the obtuse marginal.  3.  Hyperlipidemia with intolerance to all of the lipid-lowering agents.  4.  Prostatism.  5.  Possible anemia.   RECOMMENDATIONS:  The patient will be started on Lovenox, intravenous  nitroglycerin, continued on aspirin and Plavix.  His anemia will be  evaluated.  He will be placed on additional beta blockers.  He will need to  go ahead with a cardiac catheterization.  The cardiac catheterization  procedure was discussed with the patient and his family,  including risks of myocardial infarction, death, stroke, bleeding,  arrhythmia, dye allergy, or renal insufficiency.  The possibility of repeat stenting was discussed with the patient, as well as the choices between drug-  eluding and  Bair  metal stents.  He understands and is willing to proceed.      Georga Hacking, M.D.  Electronically Signed     WST/MEDQ  D:  07/16/2005  T:  07/17/2005  Job:  [id number]   cc:   Gwen Pounds, MD  Fax: [phone]

## 2010-11-19 NOTE — Consult Note (Signed)
[name], MORAVEK                     ACCOUNT NO.:  [id number]   MEDICAL RECORD NO.:  [id number]          PATIENT TYPE:  INP   LOCATION:  2926                         FACILITY:  [facility]   PHYSICIAN:  Sheliah Plane, MD    DATE OF BIRTH:  Sep 04, 1926   DATE OF CONSULTATION:  07/18/2005  DATE OF DISCHARGE:                                   CONSULTATION   REASON FOR CONSULTATION:  Acute myocardial infarction.   HISTORY OF PRESENT ILLNESS:  The patient is a 77 year old male with known  coronary occlusive disease, having had angioplasty in the early 2000 and  subsequent coronary artery bypass grafting in 2000. He had done well until  2005, when he had an angioplasty of the right coronary artery. His symptoms  improved. He has been able to play frequent golf and be active. The past  several weeks, he had noticed some increasing episodes of shoulder pain on  both sides, usually with exertion but it begun coming on at rest and he came  to the emergency room. His troponin's were elevated at 1.2, 1.26, and 1.57  with an MB of 19.8. He has had previous myocardial infarction in 2005,  previous angioplasty of the right coronary artery in 1995, Cypher stent  placed in the vein graft in 2005, previous bypass surgery. On September 07, 1998  he had coronary artery bypass x4 with left internal mammary to the left  anterior descending coronary artery, reverse saphenous vein graft to the  intermediate coronary artery, sequential reverse saphenous vein graft to the  posterior descending and posterior lateral branches of the right coronary  artery with right thigh endovein harvesting. At that time his circumflex  system was very hypoplastic and was not bypassable. The intermediate was  intra-myocardial and high on the lateral wall. Cardiac risk factors include  hypertension and hyperlipidemia. The patient is unable to tolerate any  Statins. He has no diabetes. He has a history of smoking in the past but  quit in  1975. He has had no previous stroke. Denies claudication. Baseline  creatinine is 1.4.   PAST SURGICAL HISTORY:  Is as noted above. He has also had inguinal hernia  repair and nasal septoplasty.   SOCIAL HISTORY:  The patient is married. He is retired. He is a retired  Art gallery manager. Occasional alcohol use.   MEDICATIONS:  Include aspirin, fish oil, garlic, Lumigan drops to the eye,  Mobic daily, Niacin daily, Plavix 75 mg a day including today, Toprol XL 50  daily.   ALLERGIES:  LOPID, STATINS (cause myalgias). TRICOR, CHINESE RED RICE, YEAST  (all cause muscle joint pain).   REVIEW OF SYSTEMS:  CARDIAC:  Positive for chest pain and occasional  exertional shortness of breath. He denies lower extremity edema,  palpitations, resting shortness of breath, syncope, pre-syncope, or  orthopnea. GENERAL:  Denies any constitutional symptoms. RESPIRATORY:  Dyspnea on exertion. GASTROINTESTINAL:  Denies any blood in his stool. He  notes that his hemoglobin was 13.3 in December. It is now down to 10 without  obvious GI blood loss. MUSCULOSKELETAL:  Has pains in his shoulders and  arthritis but has degenerative disk disease in cervical spine.  GENITOURINARY:  Has history of prostatism and erectile dysfunction. He also  has an elevated PSA to a little over 3. HEMATOLOGIC:  As noted, he has new  onset anemia without obvious cause. PSYCHIATRIC:  Denies. HEENT:  History of  glaucoma. Has partial hearing loss in the left ear. He wears glasses. He has  had no amaurosis or TIA.   PHYSICAL EXAMINATION:  VITAL SIGNS:  Blood pressure 133/75, pulse 62,  respiratory rate 12. Height 5 feet 9 inches tall. Weight 176 pounds.  GENERAL:  Awake and alert.  NEUROLOGIC:  Intact.  HEENT:  Pupils are reactive to light.  NECK:  Without carotid bruits. No jugular venous distention.  LUNGS:  Clear bilaterally.  CARDIAC:  Regular rate and rhythm. Without murmur, rub, or gallop.  ABDOMEN:  Examination benign without  palpable masses or organomegaly.  EXTREMITIES:  He has a closure device in the right groin without evidence of  hematoma. He has incisions of the right knee and right groin from endovein  harvesting from the right thigh. He has obvious, easily palpable vein at  both ankles.  GENITOURINARY:  Normal male genitalia. Prostate examination is not done.  LYMPH:  No cervical or supraclavicular lymph nodes.   LABORATORY DATA:  Include hemoglobin 10.9, hematocrit 31. Creatinine 1.3 and  1.4. TSH is 1.9.   Cardiac catheterization films are reviewed. The patient has total of the LAD  with good quality mammary supplying it without obvious blockage. The  circumflex is a very small non-dominant vessel and is not bypassable. The  intermediate is small. Has a previous graft to it and it may be able to be  bypassed. The right coronary artery has diffuse disease. The vein graft is  totally occluded. There is collateral filling to the PD and PL, both which  are small but probably bypassable.   IMPRESSION:  The patient with good overall functional capacity with  occlusion of the vein graft to the intermediate and to the right system with  positive enzymes and recurrent angina. The patient's mammary is intact. This  does not appear to be any suitable solution to the patient's anatomy with  angioplasty.   PLAN:  Consideration, the patient is a candidate for re-do bypass surgery. I  have discussed the risks and options with him including risk of death,  infection, stroke, myocardial infarction, bleeding, and blood transfusion.  He has been on Plavix including taking Plavix today. Consider bypass surgery  on July 25, 2005.      Sheliah Plane, MD  Electronically Signed     [name]  D:  07/18/2005  T:  07/18/2005  Job:  [id number]   cc:   [facility]

## 2010-11-19 NOTE — Discharge Summary (Signed)
[name], [name]                     ACCOUNT NO.:  [id number]   MEDICAL RECORD NO.:  [id number]          PATIENT TYPE:  INP   LOCATION:  2030                         FACILITY:  [facility]   PHYSICIAN:  [name]., M.D.DATE OF BIRTH:  23-Apr-1927   DATE OF ADMISSION:  03/04/2004  DATE OF DISCHARGE:  03/06/2004                                 DISCHARGE SUMMARY   FINAL DIAGNOSES:  1.  Subendocardial myocardial infarction, initial episode.  2.  Coronary artery disease with disease involving the graft to the right      coronary artery.  3.  Hypertension.  4.  Previous coronary artery disease.  5.  Hyperlipidemia.   PROCEDURE:  Cardiac catheterization with drug-eluting stent placement in the  graft to the right coronary artery.   HISTORY OF PRESENT ILLNESS:  This 77 year old male has a history of coronary  disease with a history of bypass grafting in 2000 and has done well.  While  playing golf the day of admission,  he developed left shoulder pain with  recurrent pain and was brought to the emergency room for further treatment.  Please see the previously dictated history and physical for remainder of the  details.   HOSPITAL COURSE:  Chest x-ray was unremarkable.  Lab data showed a  hemoglobin of 12.7, hematocrit of 35.9.  PT and PTT were normal.  Sodium is  142, potassium 3.5, chloride 111, glucose 122, BUN 18.  CPK was 7.1 with  troponin of 0.2.  CPK rose to 36.8 with a troponin of 3.75.   The patient was admitted to the hospital with recurrent chest discomfort and  had T-wave changes noted in lead aVL.  Cardiac catheterization was done the  next day showing mild inferior hypokinesis.  The native coronary arteries  were occluded.  The internal mammary graft to the LAD was widely patent.  The vein graft to the intermediate was patent which was a 40-50% stenosis in  the midportion.  The right coronary artery graft had a severe eccentric 99%  distal stenosis with a TIMI II flow  and aneurysmal dilation following it.   The patient underwent stenting of this bypass graft.  We were unable to use  distal protection because of the location of the graft stenosis.  The  patient was treated with Angiomax and had a 3.0 by 23 mm Cypher stent  dilated to 16 atmospheres with postdilation of the distal vessel with a 3.5  balloon.  He had some no reflow treated with verapamil.  He was followed in  stepdown and his CPK rose to 423 with 50 units of MB and EKG with  nonspecific changes.   He felt well the next day with no recurrence of pain.  He was up ambulatory  in the hall and was discharged in improved condition.   DISCHARGE MEDICATIONS:  1.  Aspirin 325 mg q.d.  2.  Plavix 75 mg q.d.  3.  Toprol XL 25 mg q.d.  4.  He previously was intolerant to statins because of muscle pain.   FOLLOW UP:  He is to follow up in one week and to call if there are  problems.      Kristine Royal   WST/MEDQ  D:  04/07/2004  T:  04/07/2004  Job:  [id number]

## 2011-03-29 LAB — POCT I-STAT 4, (NA,K, GLUC, HGB,HCT)
Hemoglobin: 11.9 — ABNORMAL LOW
Sodium: 144

## 2011-04-04 ENCOUNTER — Ambulatory Visit
Admission: RE | Admit: 2011-04-04 | Discharge: 2011-04-04 | Disposition: A | Discharge status: Home / Self Care | Payer: Medicare Other | Source: Ambulatory Visit | Attending: Cardiology | Admitting: Cardiology

## 2011-04-04 ENCOUNTER — Other Ambulatory Visit: Payer: Self-pay | Admitting: Cardiology

## 2011-04-04 DIAGNOSIS — I25810 Atherosclerosis of coronary artery bypass graft(s) without angina pectoris: Secondary | ICD-10-CM

## 2011-04-07 ENCOUNTER — Ambulatory Visit (HOSPITAL_COMMUNITY)
Admission: RE | Admit: 2011-04-07 | Discharge: 2011-04-07 | Disposition: A | Discharge status: Home / Self Care | Payer: Medicare Other | Source: Ambulatory Visit | Attending: Cardiology | Admitting: Cardiology

## 2011-04-07 ENCOUNTER — Telehealth: Payer: Self-pay | Admitting: Cardiothoracic Surgery

## 2011-04-07 DIAGNOSIS — I25810 Atherosclerosis of coronary artery bypass graft(s) without angina pectoris: Secondary | ICD-10-CM | POA: Insufficient documentation

## 2011-04-07 DIAGNOSIS — I2510 Atherosclerotic heart disease of native coronary artery without angina pectoris: Secondary | ICD-10-CM | POA: Insufficient documentation

## 2011-04-07 DIAGNOSIS — Z9861 Coronary angioplasty status: Secondary | ICD-10-CM | POA: Insufficient documentation

## 2011-04-07 DIAGNOSIS — I209 Angina pectoris, unspecified: Secondary | ICD-10-CM | POA: Insufficient documentation

## 2011-04-25 NOTE — Cardiovascular Report (Signed)
[name], [name]                     ACCOUNT NO.:  [id number]  MEDICAL RECORD NO.:  [id number]  LOCATION:  [facility]                         FACILITY:  [facility]  PHYSICIAN:  Georga Hacking, M.D.DATE OF BIRTH:  1926-11-22  DATE OF PROCEDURE:  04/07/2011                            CARDIAC CATHETERIZATION   HISTORY:  An 77 year old male who has had previous coronary bypass grafting twice and has had stenting of the vein graft to the OM in October 2011.  He presented with worsening angina and had known disease distally to saphenous vein graft to the right coronary artery.  His angina became increasingly frequent and severe, and he is brought in for catheterization to assess anatomy.  PROCEDURE:  Left heart catheterization with coronary angiograms and left ventriculogram.  PROCEDURE IN DETAIL:  The patient was brought to the cardiac cath lab from the short-stay center, was prepped and draped in the usual manner. After Xylocaine anesthesia, a 5-French sheath was placed in the right femoral artery percutaneously with a single anterior needle wall stick. The left and right coronary arteries and the vein grafts were selected with the right coronary catheter.  The OM graft was selected with an AL- 1 5-French catheter.  The mammary artery is not selectively injected but was demonstrated to be widely patent by flush injections.  He tolerated the procedure well.  Hemodynamic data:  Aorta postcontrast 102/45, LV postcontrast 1/2 over 1 to 8.  ANGIOGRAPHIC DATA:  Left ventriculogram:  Performed in the 30 degrees RAO projection.  The aortic valve is normal.  Mitral valve is normal. Left ventricle is normal in size.  There is inferior wall hypokinesis noted.  Estimated ejection fraction is 60%. Coronary arteries:  Arise and distribute normally.  Moderate calcification is noted. Left main coronary artery:  40% midvessel narrowing.  The left anterior descending has a severe 98% stenosis prior to  septal perforator branch and a small diagonal branch.  The intermediate branch is patent with mild irregularity.  The circumflex is patent.  The left atrial branch supplies collaterals to the distal right coronary circulation and these collaterals are not apparent on the previous catheterization of 1 year ago.  The right coronary artery has a severe 95% proximal stenosis mildly segmental.  The first acute marginal branch has an ostial 90% stenosis and a second marginal branch arises in the vessel and totally occluded, and it appears to go up a small posterior descending artery. The saphenous vein graft to the right coronary artery is now occluded. The saphenous vein graft to the intermediate or OM branch is widely patent.  There is a valve in the center portion of the graft but it is widely patent.  The stent site in the mid to distal vessel is widely patent.  The vein graft to the intermediate or obtuse marginal branch supplies collaterals to the distal right coronary artery also.  IMPRESSION: 1. Interval occlusion of the saphenous vein graft to the right     coronary artery with good collaterals from the left coronary system     and from the saphenous vein graft to the intermediate obtuse     marginal branch. 2.  Patency of the stented site to the graft to the intermediate     marginal branch. 3. Patent mammary graft. 4. Potential sites of ischemia at the distal right coronary     circulation not amenable to stenting, small diagonal branch and     septal perforator system.  RECOMMENDATIONS:  Intensive medical therapy.  I will consult Dr. Tyrone Sage again but feel the yield of repeat bypass surgery would likely be low in this situation.     Georga Hacking, M.D.     WST/MEDQ  D:  04/07/2011  T:  04/07/2011  Job:  [id number]  cc:   Gwen Pounds, MD Sheliah Plane, MD Lyn Records, M.D.  Electronically Signed by Lacretia Nicks. Donnie Aho M.D. on 04/25/2011 11:58:15 AM

## 2011-05-01 ENCOUNTER — Emergency Department (HOSPITAL_COMMUNITY): Payer: Medicare Other

## 2011-05-01 ENCOUNTER — Emergency Department (HOSPITAL_COMMUNITY)
Admission: EM | Admit: 2011-05-01 | Discharge: 2011-05-01 | Disposition: A | Discharge status: Home / Self Care | Payer: Medicare Other | Attending: Emergency Medicine | Admitting: Emergency Medicine

## 2011-05-01 DIAGNOSIS — M129 Arthropathy, unspecified: Secondary | ICD-10-CM | POA: Insufficient documentation

## 2011-05-01 DIAGNOSIS — R51 Headache: Secondary | ICD-10-CM | POA: Insufficient documentation

## 2011-05-01 DIAGNOSIS — Z8546 Personal history of malignant neoplasm of prostate: Secondary | ICD-10-CM | POA: Insufficient documentation

## 2011-05-01 DIAGNOSIS — M542 Cervicalgia: Secondary | ICD-10-CM | POA: Insufficient documentation

## 2011-05-01 DIAGNOSIS — I252 Old myocardial infarction: Secondary | ICD-10-CM | POA: Insufficient documentation

## 2011-05-01 DIAGNOSIS — R42 Dizziness and giddiness: Secondary | ICD-10-CM | POA: Insufficient documentation

## 2011-05-01 DIAGNOSIS — Z79899 Other long term (current) drug therapy: Secondary | ICD-10-CM | POA: Insufficient documentation

## 2011-05-01 LAB — BASIC METABOLIC PANEL
Calcium: 8.7 mg/dL (ref 8.4–10.5)
Chloride: 109 mEq/L (ref 96–112)
GFR calc Af Amer: 74 mL/min — ABNORMAL LOW (ref >90)
GFR calc non Af Amer: 64 mL/min — ABNORMAL LOW (ref >90)
Potassium: 3.6 mEq/L (ref 3.5–5.1)
Sodium: 142 mEq/L (ref 135–145)

## 2011-05-01 MED ORDER — GADOBENATE DIMEGLUMINE 529 MG/ML IV SOLN
20.0000 mL | Freq: Once | INTRAVENOUS | Status: AC | PRN
  Administered 2011-05-01: 20 mL via INTRAVENOUS

## 2011-09-13 ENCOUNTER — Other Ambulatory Visit: Payer: Self-pay | Admitting: Gastroenterology

## 2012-06-26 ENCOUNTER — Encounter: Payer: Self-pay | Admitting: Cardiology

## 2012-06-26 ENCOUNTER — Ambulatory Visit
Admission: RE | Admit: 2012-06-26 | Discharge: 2012-06-26 | Disposition: A | Discharge status: Home / Self Care | Payer: Medicare Other | Source: Ambulatory Visit | Attending: Cardiology | Admitting: Cardiology

## 2012-06-26 ENCOUNTER — Other Ambulatory Visit: Payer: Self-pay | Admitting: Cardiology

## 2012-06-26 DIAGNOSIS — R0789 Other chest pain: Secondary | ICD-10-CM

## 2012-06-26 DIAGNOSIS — Z85030 Personal history of malignant carcinoid tumor of large intestine: Secondary | ICD-10-CM | POA: Insufficient documentation

## 2012-06-26 DIAGNOSIS — Z8546 Personal history of malignant neoplasm of prostate: Secondary | ICD-10-CM

## 2012-06-26 DIAGNOSIS — Z951 Presence of aortocoronary bypass graft: Secondary | ICD-10-CM | POA: Insufficient documentation

## 2012-06-26 DIAGNOSIS — E785 Hyperlipidemia, unspecified: Secondary | ICD-10-CM

## 2012-06-26 DIAGNOSIS — Z85048 Personal history of other malignant neoplasm of rectum, rectosigmoid junction, and anus: Secondary | ICD-10-CM

## 2012-06-26 DIAGNOSIS — I2510 Atherosclerotic heart disease of native coronary artery without angina pectoris: Secondary | ICD-10-CM

## 2012-06-26 HISTORY — DX: Atherosclerotic heart disease of native coronary artery without angina pectoris: I25.10

## 2012-06-26 HISTORY — DX: Hyperlipidemia, unspecified: E78.5

## 2012-06-26 NOTE — H&P (Signed)
[name]    Date of visit:  06/26/2012 DOB:  06-03-1927    Age:  78 yrs. Medical record number:  [id number]     Account number:  [id number] Primary Care Provider: Gwen Pounds ____________________________ CURRENT DIAGNOSES  1. CAD. Bypass graft  2. Stent Placement (drug eluting)  3. Cad,native  4. Hyperlipidemia  5. Angina Pectoris  6. Chest pain, other  7. 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 Personal History Of Malignant Neoplasm Of Prostate  9. Surgery-Aortocoronary Bypass Grafting ____________________________ ALLERGIES  Antihyperlipidemic - HMG CoA Reductase Inhibitors  fenofibrate  gemfibrozil  Red Yeast Rice Extract  Zetia, Intolerance ____________________________ MEDICATIONS  1. Lumigan 0.03 % Drops, BID  2. garlic 1 mg capsule, 1500 mg qd  3. Vitamin D 1,000 unit Tablet, 1 p.o. daily  4. herbal drugs tablet, mega red  1 qd  5. Istalol 0.5 % Drops, Once Daily, 1 gtt ou qam  6. isosorbide mononitrate 120 mg tablet extended release 24 hr, 1 p.o. daily  7. Fish Oil 340-1,000 mg capsule, BID  8. Plavix 75 mg tablet, 1 p.o. daily  9. Ranexa 1,000 mg tablet extended release 12 hr, BID  10. Vitamin D3 1,000 unit tablet, 1 p.o. daily  11. amlodipine 5 mg tablet, 1 p.o. daily  12. metoprolol tartrate 50 mg tablet, 1-1/2 p.o. b.i.d.  13. nitroglycerin 0.4 mg tablet, sublingual, prn chest pain ____________________________ CHIEF COMPLAINTS worsening angina ____________________________ HISTORY OF PRESENT ILLNESS  Patient seen for precatheterization evaluation. Since the weather has gotten colder he has noted more chest discomfort particularly with going out in cold air. For the most part the discomfort is still exertional he had a prolonged episode yesterday that required 2 nitroglycerin noted to be more than his usual one for relief. He had a catheterization done last year that showed his previous stent site to be patent. He  states that he went to see Dr. Katrinka Blazing a month ago because he wanted to see if anything else can be done about his angina however Dr. Katrinka Blazing told him he was not thrilled about the idea of intervention in him.. He has a known occluded right coronary artery as well as an occluded right coronary artery graft. He has had a couple of episodes that occurred at night and at rest and has been quite anxious about the discomfort. His daughter who is from out of town is concerned about the amount of angina that he is having. He has had previous stenting of the graft to the intermediate branch that was patent at catheterization a year ago. He is on maximum medical therapy for angina and has angina with dyspnea level of exertion. He has previously seen Dr. Tyrone Sage who did not feel that the results of a third time reoperation would be optimal. He denies PND, orthopnea or edema. ____________________________ PAST HISTORY  Past Medical Illnesses:  prostate cancer treated with cryoablation, hyperlipidemia, history of villous adenoma;  Cardiovascular Illnesses:  CAD;  Surgical Procedures:  CABG w/LIMA and SVGs to Diag 1 PDA PLR March 2000, Dr. Tyrone Sage, inguinal herniorrhaphy-rt, nasal septoplasty, Redo CABG w SVG to OM and SVG to PDA-PL 07/2005 Dr. Tyrone Sage, colectomy-partial June 2007  for villous adenoma of the cecum;  NYHA Classification:  II;  Canadian Angina Classification:  Class 3: Angina with mild exertion;  Cardiology Procedures-Invasive:  PTCA of the RCA November 1995, Cypher Stent of SVG to RCA 9/05, cardiac cath (left) January 2007, cardiac cath (  left) October 2011, Promus stent October 2011;  Cardiology Procedures-Noninvasive:  adenosine cardiolite February 2002, treadmill April 2007;  Cardiac Cath Results:  calcified  Left main, 90% stenosis proximal LAD, occluded mid LAD, small and nondominant CFX, 90% prox RCA prior to AM, then occluded, widely patent LAD LIMA graft, SVG to RCA occluded, SVG to OM widely patent stent  site;  Peripheral Vascular Procedures:  carotid doppler January 2007;  LVEF of 60% documented via cardiac cath on 04/07/2011 ____________________________ CARDIO-PULMONARY TEST DATES EKG Date:  06/26/2012;   Cardiac Cath Date:  04/07/2011;  CABG: 07/25/2005;  Stent Placement Date: 04/22/2010;  Nuclear Study Date:  08/05/1998;  Chest Xray Date: 04/04/2011;   ____________________________ FAMILY HISTORY Father - age 23, died of prostate cancer; Mother - age 21, died of lung cancer; Brother 1 - age 39,  died of CVA; Sister 1 -  alive and well;  ____________________________ SOCIAL HISTORY Alcohol Use:  drinks occasionally;  Smoking:  used to smoke but quit Prior to 1980;  Diet:  fat modified diet;  Lifestyle:  married;  Exercise:  some exercise;  Occupation:  retired and worked in Nurse, children's;  Residence:  lives with wife;   ____________________________ REVIEW OF SYSTEMS General:  hot flashes Integumentary:  no rashes or new skin lesions.Eyes:  wears eye glasses/contact lenses, glaucoma  Ears, Nose, Throat, Mouth:  partial hearing loss left ear, hearing aid left ear Respiratory:  denies dyspnea, cough, wheezing or hemoptysis. Cardiovascular:  please review HPI  Genitourinary-Male:  prostatism, erectile dysfunction  Musculoskeletal:  aching, chronic low back pain  Neurological:  denies headaches, stroke, or TIA ____________________________ PHYSICAL EXAMINATION VITAL SIGNS  Blood Pressure:  110/60 Sitting, Left arm, regular cuff  , 114/62 Standing, Left arm and regular cuff   Pulse:  60/min. Weight:  173.00 lbs. Height:  68"BMI: 26  Constitutional:  pleasant white male in no acute distress Skin:  scattered hemangiomas Head:  normocephalic, normal hair pattern, no masses or tenderness Eyes:  EOMS Intact, PERRLA, C and S clear, Funduscopic exam not done. ENT:  ears, nose and throat reveal no gross abnormalities.  Dentition good. Neck:  supple, without massess. No  JVD, thyromegaly or carotid bruits. Carotid upstroke normal. Chest:  normal symmetry, clear to auscultation and percussion., healed median sternotomy scar Cardiac:  regular rhythm, normal S1 and S2, No S3 or S4, no murmurs, gallops or rubs detected. Abdomen:  abdomen soft,non-tender, no masses, no hepatospenomegaly, or aneurysm noted Peripheral Pulses:  femoral pulses 2+, posterior tibial pulses 2+, no bruits auscultated Extremities & Back:  well healed saphenous vein donor site RLE, well healed saphenous vein donor site LLE, no edema present Neurological:  no gross motor or sensory deficits noted, affect appropriate, oriented x3. ____________________________ MOST RECENT LIPID PANEL 05/01/12  CHOL TOTL 210 mg/dl, LDL 098 calc, HDL 26 mg/dl, TRIGLYCER 119 mg/dl and CHOL/HDL 7.9 (Calc) ____________________________ IMPRESSIONS/PLAN  1. Worsening angina in a patient with previous coronary stents and a redo bypass grafting 2. Coronary artery disease with previous bypass graft disease with previous stent to the circumflex graft and an occluded right coronary artery graft 3. Previous redo coronary bypass grafting 4. Anxiety 5. Hyperlipidemia with statin intolerance   Recommendations:  Plan repeat catheterization particularly to determine if there is a stenosis in the intermediate graft that could be amenable to stenting. I've had extensive discussions with both the patient as well as his daughter concerning the fact that several of his stenoses or not amenable to  stenting or repeat percutaneous intervention. We discussed angina in detail with him.The cardiac catheterization procedure was explained to the patient including risks of myocardial infarction, death, stroke, bleeding, arrhythmia, dye allergy, renal insufficiency. He understands and is willing to proceed. Possibility of stenting by Dr. Katrinka Blazing also discussed. ____________________________ TODAYS ORDERS  1. 12 Lead EKG: Today  2. Chest  X-ray PA/Lat: today  3. Comprehensive Metabolic Panel: Today  4. Complete Blood Count: Today  5. Draw PT/INR: Today  6. PTT: Today                       ____________________________ Cardiology Physician:  [name] MD [facility]

## 2012-06-28 ENCOUNTER — Encounter (HOSPITAL_COMMUNITY): Payer: Self-pay

## 2012-06-29 ENCOUNTER — Other Ambulatory Visit: Payer: Self-pay

## 2012-06-29 ENCOUNTER — Inpatient Hospital Stay (HOSPITAL_COMMUNITY)
Admission: EM | Admit: 2012-06-29 | Discharge: 2012-07-04 | DRG: 247 | Disposition: A | Discharge status: Home / Self Care | Payer: Medicare Other | Attending: Cardiology | Admitting: Cardiology

## 2012-06-29 ENCOUNTER — Encounter (HOSPITAL_COMMUNITY)
Admission: EM | Disposition: A | Discharge status: Home / Self Care | Payer: Self-pay | Source: Home / Self Care | Attending: Cardiology

## 2012-06-29 ENCOUNTER — Emergency Department (HOSPITAL_COMMUNITY): Payer: Medicare Other

## 2012-06-29 ENCOUNTER — Encounter (HOSPITAL_COMMUNITY): Payer: Self-pay | Admitting: Family Medicine

## 2012-06-29 DIAGNOSIS — I2510 Atherosclerotic heart disease of native coronary artery without angina pectoris: Secondary | ICD-10-CM

## 2012-06-29 DIAGNOSIS — R079 Chest pain, unspecified: Secondary | ICD-10-CM

## 2012-06-29 DIAGNOSIS — I25810 Atherosclerosis of coronary artery bypass graft(s) without angina pectoris: Secondary | ICD-10-CM | POA: Diagnosis present

## 2012-06-29 DIAGNOSIS — Z9861 Coronary angioplasty status: Secondary | ICD-10-CM

## 2012-06-29 DIAGNOSIS — D696 Thrombocytopenia, unspecified: Secondary | ICD-10-CM | POA: Diagnosis present

## 2012-06-29 DIAGNOSIS — Y849 Medical procedure, unspecified as the cause of abnormal reaction of the patient, or of later complication, without mention of misadventure at the time of the procedure: Secondary | ICD-10-CM | POA: Diagnosis present

## 2012-06-29 DIAGNOSIS — E785 Hyperlipidemia, unspecified: Secondary | ICD-10-CM | POA: Diagnosis present

## 2012-06-29 DIAGNOSIS — Z955 Presence of coronary angioplasty implant and graft: Secondary | ICD-10-CM

## 2012-06-29 DIAGNOSIS — I200 Unstable angina: Secondary | ICD-10-CM | POA: Diagnosis present

## 2012-06-29 DIAGNOSIS — I10 Essential (primary) hypertension: Secondary | ICD-10-CM | POA: Diagnosis present

## 2012-06-29 DIAGNOSIS — Z951 Presence of aortocoronary bypass graft: Secondary | ICD-10-CM

## 2012-06-29 DIAGNOSIS — Z87891 Personal history of nicotine dependence: Secondary | ICD-10-CM

## 2012-06-29 DIAGNOSIS — T82897A Other specified complication of cardiac prosthetic devices, implants and grafts, initial encounter: Principal | ICD-10-CM | POA: Diagnosis present

## 2012-06-29 HISTORY — PX: LEFT HEART CATHETERIZATION WITH CORONARY/GRAFT ANGIOGRAM: SHX5450

## 2012-06-29 LAB — POCT I-STAT, CHEM 8
Calcium, Ion: 1.16 mmol/L (ref 1.13–1.30)
Chloride: 109 mEq/L (ref 96–112)
HCT: 38 % — ABNORMAL LOW (ref 39.0–52.0)
Potassium: 3.4 mEq/L — ABNORMAL LOW (ref 3.5–5.1)

## 2012-06-29 LAB — CBC
HCT: 34.2 % — ABNORMAL LOW (ref 39.0–52.0)
Hemoglobin: 12.2 g/dL — ABNORMAL LOW (ref 13.0–17.0)
Platelets: 94 10*3/uL — ABNORMAL LOW (ref 150–400)
RBC: 4.1 MIL/uL — ABNORMAL LOW (ref 4.22–5.81)
WBC: 4.5 10*3/uL (ref 4.0–10.5)
WBC: 4.4 10*3/uL (ref 4.0–10.5)

## 2012-06-29 LAB — POCT I-STAT TROPONIN I: Troponin i, poc: 0 ng/mL (ref 0.00–0.08)

## 2012-06-29 LAB — POCT ACTIVATED CLOTTING TIME: Activated Clotting Time: 170 seconds

## 2012-06-29 LAB — BASIC METABOLIC PANEL
BUN: 11 mg/dL (ref 6–23)
CO2: 23 mEq/L (ref 19–32)
Chloride: 107 mEq/L (ref 96–112)
Chloride: 109 mEq/L (ref 96–112)
Glucose, Bld: 89 mg/dL (ref 70–99)
Potassium: 3.3 mEq/L — ABNORMAL LOW (ref 3.5–5.1)
Potassium: 3.4 mEq/L — ABNORMAL LOW (ref 3.5–5.1)
Sodium: 141 mEq/L (ref 135–145)

## 2012-06-29 LAB — MRSA PCR SCREENING: MRSA by PCR: NEGATIVE

## 2012-06-29 LAB — TROPONIN I: Troponin I: <0.3 ng/mL (ref <0.30)

## 2012-06-29 SURGERY — LEFT HEART CATHETERIZATION WITH CORONARY/GRAFT ANGIOGRAM
Anesthesia: LOCAL

## 2012-06-29 MED ORDER — NITROGLYCERIN IN D5W 200-5 MCG/ML-% IV SOLN
2.0000 ug/min | INTRAVENOUS | Status: DC
  Administered 2012-06-29: 10 ug/min via INTRAVENOUS
  Administered 2012-07-01 – 2012-07-02 (×2): 20 ug/min via INTRAVENOUS
  Filled 2012-06-29 (×2): qty 250

## 2012-06-29 MED ORDER — OMEGA-3-ACID ETHYL ESTERS 1 G PO CAPS
1.0000 g | ORAL_CAPSULE | Freq: Two times a day (BID) | ORAL | Status: DC
  Administered 2012-06-29 – 2012-07-03 (×8): 1 g via ORAL
  Filled 2012-06-29 (×10): qty 1

## 2012-06-29 MED ORDER — SODIUM CHLORIDE 0.9 % IV SOLN: 250.0000 mL | INTRAVENOUS | Status: DC | PRN

## 2012-06-29 MED ORDER — MIDAZOLAM HCL 2 MG/2ML IJ SOLN
INTRAMUSCULAR | Status: AC
  Filled 2012-06-29: qty 2

## 2012-06-29 MED ORDER — NITROGLYCERIN 0.2 MG/ML ON CALL CATH LAB
INTRAVENOUS | Status: AC
  Filled 2012-06-29: qty 1

## 2012-06-29 MED ORDER — SODIUM CHLORIDE 0.9 % IJ SOLN: 3.0000 mL | INTRAMUSCULAR | Status: DC | PRN

## 2012-06-29 MED ORDER — SODIUM CHLORIDE 0.9 % IJ SOLN: 3.0000 mL | Freq: Two times a day (BID) | INTRAMUSCULAR | Status: DC

## 2012-06-29 MED ORDER — SODIUM CHLORIDE 0.9 % IV SOLN
INTRAVENOUS | Status: AC
  Administered 2012-06-29: 13:00:00 via INTRAVENOUS

## 2012-06-29 MED ORDER — HEPARIN (PORCINE) IN NACL 100-0.45 UNIT/ML-% IJ SOLN
1000.0000 [IU]/h | INTRAMUSCULAR | Status: DC
  Administered 2012-06-29: 1000 [IU]/h via INTRAVENOUS
  Filled 2012-06-29 (×2): qty 250

## 2012-06-29 MED ORDER — DIAZEPAM 5 MG PO TABS
5.0000 mg | ORAL_TABLET | ORAL | Status: DC
  Filled 2012-06-29: qty 1

## 2012-06-29 MED ORDER — AMLODIPINE BESYLATE 5 MG PO TABS
5.0000 mg | ORAL_TABLET | Freq: Every day | ORAL | Status: DC
  Administered 2012-06-29: 5 mg via ORAL
  Filled 2012-06-29 (×2): qty 1

## 2012-06-29 MED ORDER — MORPHINE SULFATE 2 MG/ML IJ SOLN
2.0000 mg | INTRAMUSCULAR | Status: DC | PRN
  Administered 2012-06-29 (×2): 2 mg via INTRAVENOUS
  Filled 2012-06-29 (×2): qty 1

## 2012-06-29 MED ORDER — ACETAMINOPHEN 325 MG PO TABS: 650.0000 mg | ORAL_TABLET | ORAL | Status: DC | PRN

## 2012-06-29 MED ORDER — SODIUM CHLORIDE 0.9 % IV SOLN: INTRAVENOUS | Status: DC

## 2012-06-29 MED ORDER — ASPIRIN 81 MG PO CHEW: 324.0000 mg | CHEWABLE_TABLET | ORAL | Status: DC

## 2012-06-29 MED ORDER — HEPARIN BOLUS VIA INFUSION
4000.0000 [IU] | Freq: Once | INTRAVENOUS | Status: AC
  Administered 2012-06-29: 4000 [IU] via INTRAVENOUS
  Filled 2012-06-29: qty 4000

## 2012-06-29 MED ORDER — ATROPINE SULFATE 1 MG/ML IJ SOLN
INTRAMUSCULAR | Status: AC
  Filled 2012-06-29: qty 1

## 2012-06-29 MED ORDER — POTASSIUM CHLORIDE CRYS ER 20 MEQ PO TBCR
40.0000 meq | EXTENDED_RELEASE_TABLET | Freq: Once | ORAL | Status: AC
  Administered 2012-06-29: 40 meq via ORAL
  Filled 2012-06-29: qty 2

## 2012-06-29 MED ORDER — TIMOLOL MALEATE 0.5 % OP SOLN
1.0000 [drp] | Freq: Every day | OPHTHALMIC | Status: DC
  Administered 2012-06-30 – 2012-07-03 (×4): 1 [drp] via OPHTHALMIC
  Filled 2012-06-29: qty 5

## 2012-06-29 MED ORDER — HEPARIN SODIUM (PORCINE) 1000 UNIT/ML IJ SOLN
INTRAMUSCULAR | Status: AC
  Filled 2012-06-29: qty 1

## 2012-06-29 MED ORDER — FENTANYL CITRATE 0.05 MG/ML IJ SOLN
INTRAMUSCULAR | Status: AC
  Filled 2012-06-29: qty 2

## 2012-06-29 MED ORDER — SODIUM CHLORIDE 0.9 % IV SOLN
INTRAVENOUS | Status: DC
  Administered 2012-07-01: 10 mL/h via INTRAVENOUS

## 2012-06-29 MED ORDER — ISOSORBIDE MONONITRATE ER 60 MG PO TB24
120.0000 mg | ORAL_TABLET | Freq: Every day | ORAL | Status: DC
  Administered 2012-06-29: 120 mg via ORAL
  Filled 2012-06-29 (×2): qty 2

## 2012-06-29 MED ORDER — CLOPIDOGREL BISULFATE 75 MG PO TABS
75.0000 mg | ORAL_TABLET | Freq: Every day | ORAL | Status: DC
  Administered 2012-06-29: 75 mg via ORAL
  Filled 2012-06-29: qty 1

## 2012-06-29 MED ORDER — RANOLAZINE ER 500 MG PO TB12
1000.0000 mg | ORAL_TABLET | Freq: Every day | ORAL | Status: DC
  Administered 2012-06-29 – 2012-06-30 (×2): 1000 mg via ORAL
  Filled 2012-06-29 (×2): qty 2

## 2012-06-29 MED ORDER — LIDOCAINE HCL (PF) 1 % IJ SOLN
INTRAMUSCULAR | Status: AC
  Filled 2012-06-29: qty 30

## 2012-06-29 MED ORDER — HEPARIN (PORCINE) IN NACL 100-0.45 UNIT/ML-% IJ SOLN
1000.0000 [IU]/h | INTRAMUSCULAR | Status: DC
  Administered 2012-06-30 (×2): 1000 [IU]/h via INTRAVENOUS
  Filled 2012-06-29: qty 250

## 2012-06-29 MED ORDER — NITROGLYCERIN 0.4 MG SL SUBL
0.4000 mg | SUBLINGUAL_TABLET | SUBLINGUAL | Status: DC | PRN
  Administered 2012-06-29: 0.4 mg via SUBLINGUAL
  Filled 2012-06-29: qty 25

## 2012-06-29 MED ORDER — LORAZEPAM 0.5 MG PO TABS
0.5000 mg | ORAL_TABLET | ORAL | Status: DC | PRN
  Administered 2012-06-29: 0.5 mg via ORAL
  Filled 2012-06-29: qty 1

## 2012-06-29 MED ORDER — HEPARIN (PORCINE) IN NACL 100-0.45 UNIT/ML-% IJ SOLN
1000.0000 [IU]/h | INTRAMUSCULAR | Status: DC
  Filled 2012-06-29: qty 250

## 2012-06-29 MED ORDER — BIMATOPROST 0.01 % OP SOLN
1.0000 [drp] | Freq: Two times a day (BID) | OPHTHALMIC | Status: DC
  Administered 2012-06-29 – 2012-07-02 (×5): 1 [drp] via OPHTHALMIC
  Filled 2012-06-29: qty 2.5

## 2012-06-29 MED ORDER — OMEGA-3 FATTY ACIDS 1000 MG PO CAPS: 1.0000 g | ORAL_CAPSULE | Freq: Two times a day (BID) | ORAL | Status: DC

## 2012-06-29 MED ORDER — METOPROLOL TARTRATE 50 MG PO TABS
75.0000 mg | ORAL_TABLET | Freq: Two times a day (BID) | ORAL | Status: DC
  Administered 2012-06-29 – 2012-07-04 (×11): 75 mg via ORAL
  Filled 2012-06-29 (×13): qty 1

## 2012-06-29 MED ORDER — ONDANSETRON HCL 4 MG/2ML IJ SOLN: 4.0000 mg | Freq: Four times a day (QID) | INTRAMUSCULAR | Status: DC | PRN

## 2012-06-29 MED ORDER — NITROGLYCERIN IN D5W 200-5 MCG/ML-% IV SOLN
INTRAVENOUS | Status: AC
  Administered 2012-06-29: 10 ug/min via INTRAVENOUS
  Filled 2012-06-29: qty 250

## 2012-06-29 MED ORDER — ASPIRIN 81 MG PO CHEW
324.0000 mg | CHEWABLE_TABLET | Freq: Every day | ORAL | Status: DC
  Administered 2012-06-29 – 2012-07-02 (×4): 324 mg via ORAL
  Filled 2012-06-29 (×6): qty 4

## 2012-06-29 MED ORDER — NITROGLYCERIN 0.4 MG SL SUBL
0.4000 mg | SUBLINGUAL_TABLET | SUBLINGUAL | Status: DC | PRN
  Administered 2012-06-29 (×4): 0.4 mg via SUBLINGUAL
  Filled 2012-06-29: qty 75

## 2012-06-29 MED ORDER — HEPARIN (PORCINE) IN NACL 2-0.9 UNIT/ML-% IJ SOLN
INTRAMUSCULAR | Status: AC
  Filled 2012-06-29: qty 1000

## 2012-06-29 NOTE — Progress Notes (Signed)
Patient Name: [name] Date of Encounter: 06/29/2012    SUBJECTIVE: Admitted with severe chest pain.  TELEMETRY:  NSR: Filed Vitals:   06/29/12 0245 06/29/12 0300 06/29/12 0358 06/29/12 0620  BP: 131/63 126/70 133/77 146/73  Pulse: 58 59 61 65  Temp:   97.6 F (36.4 C)   TempSrc:      Resp: 10 12 16    Height:   5\' 6"  (1.676 m)   Weight:   70.126 kg (154 lb 9.6 oz)   SpO2: 96% 94% 96%    No intake or output data in the 24 hours ending 06/29/12 0812  LABS: Basic Metabolic Panel:  Basename 06/29/12 0040 06/29/12 0014  NA 142 141  K 3.4* 3.4*  CL 109 107  CO2 -- 23  GLUCOSE 130* 132*  BUN 11 12  CREATININE 1.30 1.22  CALCIUM -- 8.6  MG -- --  PHOS -- --   CBC:  Basename 06/29/12 0040 06/29/12 0014  WBC -- 4.5  NEUTROABS -- --  HGB 12.9* 12.6*  HCT 38.0* 35.9*  MCV -- 87.6  PLT -- 94*  Radiology/Studies:  NAD  Physical Exam: Blood pressure 146/73, pulse 65, temperature 97.6 F (36.4 C), temperature source Oral, resp. rate 16, height 5\' 6"  (1.676 m), weight 70.126 kg (154 lb 9.6 oz), SpO2 96.00%. Weight change:    1/6 systolic murmur  ASSESSMENT:  1. Class 4 angina on maximal medical therapy and previous anatomy not amenable to PCI  Plan:  Will restudy today. Will have high risk anatomy with unfavorable targets for PCI most likely. Will be at risk for MI, death, bleeding, kidney injury, limb ischemia, bleeding etc. He is willing to porceed.  [name] 06/29/2012, 8:12 AM

## 2012-06-29 NOTE — Progress Notes (Signed)
Pt's R femoral sheath removed; groin site level 0 before pull; pressure held for 25 minutes starting at 1600; site level 0 after sheath out; pedal pulses +2; VS remained stable throughout sheath pull; education given to pt about holding pressure on site when bearing down or coughing and bedrest.  Emotional support given to pt & to family

## 2012-06-29 NOTE — ED Notes (Signed)
X-ray at bedside

## 2012-06-29 NOTE — Progress Notes (Signed)
ANTICOAGULATION CONSULT NOTE - Initial Consult  Pharmacy Consult for Heparin Indication: chest pain/ACS  Allergies  Allergen Reactions  . Fenofibrate   . Gemfibrozil   . Red Yeast Rice (Cholestin)   . Statins   . Zetia (Ezetimibe)     Patient Measurements:   Height  68 " Weight:  173 lb   Vital Signs: Temp: 97.9 F (36.6 C) (12/27 0017) Temp src: Oral (12/27 0017) BP: 126/70 mmHg (12/27 0300) Pulse Rate: 59  (12/27 0300)  Labs:  Basename 06/29/12 0040 06/29/12 0014  HGB 12.9* 12.6*  HCT 38.0* 35.9*  PLT -- 94*  APTT -- --  LABPROT -- --  INR -- --  HEPARINUNFRC -- --  CREATININE 1.30 1.22  CKTOTAL -- --  CKMB -- --  TROPONINI -- --    CrCl is unknown because there is no height on file for the current visit.   Medical History: Past Medical History  Diagnosis Date  . CAD (coronary artery disease) 06/26/2012    PTCA RCA 1995 CABG w. LIMA to LAD, SVG to dx 1, PD and PL March 2000 Dr. Tyrone Sage for 3VD Cypher stent SVG to RCA  2005 redo CABG 07/2005 with SVG to OM and SVG to PD-PL    . H/O prostate cancer     Prior cryoablation   . Hyperlipidemia 06/26/2012  . S/P CABG (coronary artery bypass graft)   . Villous adenoma of rectum   . Anxiety     Medications:  Prescriptions prior to admission  Medication Sig Dispense Refill  . amLODipine (NORVASC) 5 MG tablet Take 5 mg by mouth daily.      . bimatoprost (LUMIGAN) 0.03 % ophthalmic solution Place 1 drop into both eyes 2 (two) times daily.      . clopidogrel (PLAVIX) 75 MG tablet Take 75 mg by mouth daily.      . fish oil-omega-3 fatty acids 1000 MG capsule Take 1 g by mouth 2 (two) times daily.       [name] GARLIC PO Take 1 tablet by mouth daily.      . isosorbide mononitrate (IMDUR) 120 MG 24 hr tablet Take 120 mg by mouth daily.      . metoprolol (LOPRESSOR) 50 MG tablet Take 75 mg by mouth 2 (two) times daily.      . nitroGLYCERIN (NITROSTAT) 0.4 MG SL tablet Place 0.4 mg under the tongue every 5 (five) minutes  as needed. Chest pain      . ranolazine (RANEXA) 1000 MG SR tablet Take 1,000 mg by mouth daily.       . Timolol Maleate (ISTALOL) 0.5 % (DAILY) SOLN Apply 1 drop to eye daily.      [name] VITAMIN D, CHOLECALCIFEROL, PO Take 1 tablet by mouth daily.      [name] aspirin 81 MG chewable tablet Chew 324 mg by mouth daily.        Assessment: 78 yo male with chest pain for Heparin  Goal of Therapy:  Heparin level 0.3-0.7 units/ml Monitor platelets by anticoagulation protocol: Yes   Plan:  Heparin 4000 units IV bolus, then 1000 units/hr Check heparin level in 8 hours.  Camila Norville, Gary Fleet 06/29/2012,3:58 AM

## 2012-06-29 NOTE — ED Notes (Signed)
EDP at bedside  

## 2012-06-29 NOTE — Progress Notes (Signed)
Pt c/o 8/10 CP; EKG performed; no new changes; 1 SLNTG given with resolve of pain; BP 116/51(66); HR 62, 95% on RA; pt not sleeping; will continue to monitor closely and update as needed

## 2012-06-29 NOTE — H&P (Signed)
Physician History and Physical    [name] MRN: [id number] DOB/AGE: 02/23/1927 78 y.o. Admit date: 06/29/2012   Primary Cardiologist:  Dr. Sherilyn Cooter Smith/Dr. Donnie Aho  CC:  Chest pain   HPI:  Pt is an 78 yo man with CAD s/p CABG, PCI and redo CABG, chronic angina who presents with chest pain.  Pt has had chronic stable angina for the past 6-8 wks that he describes as shoulder pain.  The pain has always improved with SL nitro.  He has about 2 pain episodes per week.  Tonight, he got into bed and had shoulder pain on laying down.  He took a total of 4 nitro and the pain initially improved after each one, but then the pain came back and went into his chest, so he told his wife to call EMS and he took 2 SL nitro and the pain improved.  He was given more nitro and aspirin 324 mg by EMS and his pain resolved.  He has not had recurrence of his CP since then.  He denies SOB, n, v, diaphoresis, other complaints.  He saw Dr. Donnie Aho in clinic earlier this week and plan for was repeat cath on Monday to see if he has any lesions amenable to PCI.        Review of systems: A review of 10 organ systems was done and is negative except as stated above in HPI  Past Medical History  Diagnosis Date  . CAD (coronary artery disease) 06/26/2012    PTCA RCA 1995 CABG w. LIMA to LAD, SVG to dx 1, PD and PL March 2000 Dr. Tyrone Sage for 3VD Cypher stent SVG to RCA  2005 redo CABG 07/2005 with SVG to OM and SVG to PD-PL    . H/O prostate cancer     Prior cryoablation   . Hyperlipidemia 06/26/2012  . S/P CABG (coronary artery bypass graft)   . Villous adenoma of rectum   . Anxiety    Past Surgical History  Procedure Date  . Coronary artery bypass graft 2000, 2007    LIMA and SVGs to Diag 1 PDA PLR March 2000,  SVG to OM and SVG to PDA-PL 07/2005  . Partial colectomy   . Prostate cryoablation   . Hernia repair   . Nasal septum surgery    History   Social History  . Marital Status: Married    Spouse  Name: N/A    Number of Children: N/A  . Years of Education: N/A   Occupational History  . Not on file.   Social History Main Topics  . Smoking status: Former Games developer  . Smokeless tobacco: Never Used  . Alcohol Use: 0.5 oz/week    1 drink(s) per week  . Drug Use: No  . Sexually Active: Not on file   Other Topics Concern  . Not on file   Social History Narrative   Retired, worked in Radiographer, therapeutic     FAMILY HISTORY  Father - age 75, died of prostate cancer; Mother - age 68, died of lung cancer; Brother 1 - age 37, died of CVA; Sister 1 - alive and well;    Allergies  Allergen Reactions  . Fenofibrate   . Gemfibrozil   . Red Yeast Rice (Cholestin)   . Statins   . Zetia (Ezetimibe)      (Not in a hospital admission)  No current facility-administered medications for this encounter. Current outpatient prescriptions:amLODipine (NORVASC)  5 MG tablet, Take 5 mg by mouth daily., Disp: , Rfl: ;  bimatoprost (LUMIGAN) 0.03 % ophthalmic solution, Place 1 drop into both eyes 2 (two) times daily., Disp: , Rfl: ;  clopidogrel (PLAVIX) 75 MG tablet, Take 75 mg by mouth daily., Disp: , Rfl: ;  fish oil-omega-3 fatty acids 1000 MG capsule, Take 1 g by mouth 2 (two) times daily. , Disp: , Rfl:  GARLIC PO, Take 1 tablet by mouth daily., Disp: , Rfl: ;  isosorbide mononitrate (IMDUR) 120 MG 24 hr tablet, Take 120 mg by mouth daily., Disp: , Rfl: ;  metoprolol (LOPRESSOR) 50 MG tablet, Take 75 mg by mouth 2 (two) times daily., Disp: , Rfl: ;  nitroGLYCERIN (NITROSTAT) 0.4 MG SL tablet, Place 0.4 mg under the tongue every 5 (five) minutes as needed. Chest pain, Disp: , Rfl:  ranolazine (RANEXA) 1000 MG SR tablet, Take 1,000 mg by mouth daily. , Disp: , Rfl: ;  Timolol Maleate (ISTALOL) 0.5 % (DAILY) SOLN, Apply 1 drop to eye daily., Disp: , Rfl: ;  VITAMIN D, CHOLECALCIFEROL, PO, Take 1 tablet by mouth daily., Disp: , Rfl: ;  aspirin 81 MG chewable tablet, Chew  324 mg by mouth daily., Disp: , Rfl:   Physical Exam: Blood pressure 115/62, pulse 62, temperature 97.9 F (36.6 C), temperature source Oral, resp. rate 14, SpO2 95.00%.; There is no height or weight on file to calculate BMI. Temp:  [97.9 F (36.6 C)] 97.9 F (36.6 C) (12/27 0017) Pulse Rate:  [61-64] 62  (12/27 0115) Resp:  [12-16] 14  (12/27 0115) BP: (112-132)/(59-75) 115/62 mmHg (12/27 0115) SpO2:  [95 %-97 %] 95 % (12/27 0115)  No intake or output data in the 24 hours ending 06/29/12 0201 General: NAD Heent: MMM Neck: No JVD  CV: distant heart sounds. RRR   Lungs: Clear to auscultation bilaterally with normal respiratory effort Abdomen: Soft, nontender, nondistended Extremities: No clubbing or cyanosis.  No pedal edema Skin: Intact without lesions or rashes  Neurologic: Alert and oriented x 3, grossly nonfocal  Psych: Normal mood and affect    Labs: No results found for this basename: CKTOTAL:4,CKMB:4,TROPONINI:4 in the last 72 hours Lab Results  Component Value Date   WBC 4.5 06/29/2012   HGB 12.9* 06/29/2012   HCT 38.0* 06/29/2012   MCV 87.6 06/29/2012   PLT 94* 06/29/2012    Lab 06/29/12 0040 06/29/12 0014  NA 142 --  K 3.4* --  CL 109 --  CO2 -- 23  BUN 11 --  CREATININE 1.30 --  CALCIUM -- 8.6  PROT -- --  BILITOT -- --  ALKPHOS -- --  ALT -- --  AST -- --  GLUCOSE 130* --   No results found for this basename: CHOL, HDL, LDLCALC, TRIG   Trop 1.61   EKG:  Sinus, RBBB, nonspec ST changes  Cath:   2012 IMPRESSION:  1. Interval occlusion of the saphenous vein graft to the right  coronary artery with good collaterals from the left coronary system  and from the saphenous vein graft to the intermediate obtuse  marginal branch.  2. Patency of the stented site to the graft to the intermediate  marginal branch.  3. Patent mammary graft.  4. Potential sites of ischemia at the distal right coronary  circulation not amenable to stenting, small diagonal  branch and  septal perforator system.   Radiology:  Dg Chest Port 1 View  06/29/2012  *RADIOLOGY REPORT*  Clinical Data: Chest pain,  radiating into the shoulders and left arm.  PORTABLE CHEST - 1 VIEW  Comparison: Chest radiograph performed 06/26/2012  Findings: The lungs are well-aerated and clear.  There is no evidence of focal opacification, pleural effusion or pneumothorax.  The cardiomediastinal silhouette is normal in size; the patient is status post median sternotomy, with evidence of prior CABG.  No acute osseous abnormalities are seen.  Mild degenerative change is noted at the acromioclavicular joints bilaterally, more prominent on the right.  IMPRESSION: No acute cardiopulmonary process seen.   Original Report Authenticated By: Tonia Ghent, M.D.     ASSESSMENT: Pt is an 78 yo man with CAD s/p CABG, PCI and redo CABG, chronic angina who presents with chest pain.   PLAN:  Chest pain - pt with chronic stable angina, but this pain episode was worse and only relieved after 6-8 SL nitro.  EKG with nonspec ST changes, trop negative.  Pt is currently CP free - s/p ASA 324 mg per EMS - continue daily aspirin - continue home plavix, BB, imdur, ranexa - start heparin gtt - SL nitro or morphine prn pain  - serial enzymes and EKGs - pt had cath planned for Monday - will make NPO for cath tomorrow am.  Plan is to see if there is anything to revascularize percutaneously  HTN - controlled.  Continue home meds  HLD - allergic to statins. Continue lovaza  Dispo - Admit to [place]   Signed: Hilary Hertz, MD Cardiology Fellow 06/29/2012, 2:01 AM

## 2012-06-29 NOTE — Progress Notes (Signed)
pt standing beside bed; stated that he need to find his NTG tabs; stated that his chest pain 9/10 and climbing; reassured pt that I could get him NTG tablets and assisted pt back to bed; pt received 1st SL NTG @ 2130; BP 146/83 states pain still 9/10; after 5 minutes chest pain rated 7/10; 2nd SL NTG  Given; BP 118/58; Dr. Katrinka Blazing paged; page returned new orders to given IV NTG gtt at to keep SBP >90

## 2012-06-29 NOTE — ED Provider Notes (Signed)
History     CSN: [id number]  Arrival date & time 06/29/12  0006   First MD Initiated Contact with Patient 06/29/12 0016      Chief Complaint  Patient presents with  . Chest Pain    (Consider location/radiation/quality/duration/timing/severity/associated sxs/prior treatment) HPI History provided by patient. Has known heart disease, followed by cardiology and for recurrent worsening chest pain is scheduled for cardiac catheterization this coming Monday. Tonight at home was going to bed and developed severe pain across his chest and both shoulders. He took multiple nitroglycerin tablets at home without relief and called 911. No diaphoresis. No shortness of breath. No nausea vomiting. No back pain. He received aspirin and more nitroglycerin in route by the time he arrives to the emergency department is pain-free. Pain was pressure-like and not related to exertion. No known aggravating factors. At time of my evaluation and was asymptomatic Past Medical History  Diagnosis Date  . CAD (coronary artery disease) 06/26/2012    PTCA RCA 1995 CABG w. LIMA to LAD, SVG to dx 1, PD and PL March 2000 Dr. Tyrone Sage for 3VD Cypher stent SVG to RCA  2005 redo CABG 07/2005 with SVG to OM and SVG to PD-PL    . H/O prostate cancer     Prior cryoablation   . Hyperlipidemia 06/26/2012  . S/P CABG (coronary artery bypass graft)   . Villous adenoma of rectum   . Anxiety     Past Surgical History  Procedure Date  . Coronary artery bypass graft 2000, 2007    LIMA and SVGs to Diag 1 PDA PLR March 2000,  SVG to OM and SVG to PDA-PL 07/2005  . Partial colectomy   . Prostate cryoablation   . Hernia repair   . Nasal septum surgery     No family history on file.  History  Substance Use Topics  . Smoking status: Former Games developer  . Smokeless tobacco: Never Used  . Alcohol Use: 0.5 oz/week    1 drink(s) per week      Review of Systems  Constitutional: Negative for fever and chills.  HENT: Negative for  neck pain and neck stiffness.   Eyes: Negative for pain.  Respiratory: Negative for shortness of breath.   Cardiovascular: Positive for chest pain.  Gastrointestinal: Negative for abdominal pain.  Genitourinary: Negative for dysuria.  Musculoskeletal: Negative for back pain.  Skin: Negative for rash.  Neurological: Negative for headaches.  All other systems reviewed and are negative.    Allergies  Fenofibrate; Gemfibrozil; Red yeast rice; Statins; and Zetia  Home Medications   Current Outpatient Rx  Name  Route  Sig  Dispense  Refill  . AMLODIPINE BESYLATE 5 MG PO TABS   Oral   Take 5 mg by mouth daily.         [name] BIMATOPROST 0.03 % OP SOLN   Both Eyes   Place 1 drop into both eyes 2 (two) times daily.         [name] CLOPIDOGREL BISULFATE 75 MG PO TABS   Oral   Take 75 mg by mouth daily.         . OMEGA-3 FATTY ACIDS 1000 MG PO CAPS   Oral   Take 1 g by mouth 2 (two) times daily.          [name] GARLIC PO   Oral   Take 1 tablet by mouth daily.         . ISOSORBIDE MONONITRATE ER 120 MG PO TB24  Oral   Take 120 mg by mouth daily.         [name] METOPROLOL TARTRATE 50 MG PO TABS   Oral   Take 75 mg by mouth 2 (two) times daily.         [name] NITROGLYCERIN 0.4 MG SL SUBL   Sublingual   Place 0.4 mg under the tongue every 5 (five) minutes as needed. Chest pain         . RANOLAZINE ER 1000 MG PO TB12   Oral   Take 1,000 mg by mouth daily.          [name] TIMOLOL MALEATE 0.5 % (DAILY) OP SOLN   Ophthalmic   Apply 1 drop to eye daily.         [name] VITAMIN D (CHOLECALCIFEROL) PO   Oral   Take 1 tablet by mouth daily.         . ASPIRIN 81 MG PO CHEW   Oral   Chew 324 mg by mouth daily.           BP 119/65  Pulse 59  Temp 97.9 F (36.6 C) (Oral)  Resp 15  SpO2 97%  Physical Exam  Constitutional: He is oriented to person, place, and time. He appears well-developed and well-nourished.  HENT:  Head: Normocephalic and atraumatic.  Eyes: Conjunctivae  normal and EOM are normal. Pupils are equal, round, and reactive to light.  Neck: Trachea normal. Neck supple. No thyromegaly present.  Cardiovascular: Normal rate, regular rhythm, S1 normal, S2 normal and normal pulses.     No systolic murmur is present   No diastolic murmur is present  Pulses:      Radial pulses are 2+ on the right side, and 2+ on the left side.  Pulmonary/Chest: Effort normal and breath sounds normal. He has no wheezes. He has no rhonchi. He has no rales. He exhibits no tenderness.  Abdominal: Soft. Normal appearance and bowel sounds are normal. There is no tenderness. There is no CVA tenderness and negative Murphy's sign.  Musculoskeletal:       BLE:s Calves nontender, no cords or erythema, negative Homans sign  Neurological: He is alert and oriented to person, place, and time. He has normal strength. No cranial nerve deficit or sensory deficit. GCS eye subscore is 4. GCS verbal subscore is 5. GCS motor subscore is 6.  Skin: Skin is warm and dry. No rash noted. He is not diaphoretic.  Psychiatric: His speech is normal.       Cooperative and appropriate    ED Course  Procedures (including critical care time)  Results for orders placed during the hospital encounter of 06/29/12  CBC      Component Value Range   WBC 4.5  4.0 - 10.5 K/uL   RBC 4.10 (*) 4.22 - 5.81 MIL/uL   Hemoglobin 12.6 (*) 13.0 - 17.0 g/dL   HCT 57.8 (*) 46.9 - 62.9 %   MCV 87.6  78.0 - 100.0 fL   MCH 30.7  26.0 - 34.0 pg   MCHC 35.1  30.0 - 36.0 g/dL   RDW 52.8 (*) 41.3 - 24.4 %   Platelets 94 (*) 150 - 400 K/uL  BASIC METABOLIC PANEL      Component Value Range   Sodium 141  135 - 145 mEq/L   Potassium 3.4 (*) 3.5 - 5.1 mEq/L   Chloride 107  96 - 112 mEq/L   CO2 23  19 - 32 mEq/L   Glucose, Bld  132 (*) 70 - 99 mg/dL   BUN 12  6 - 23 mg/dL   Creatinine, Ser 1.61  0.50 - 1.35 mg/dL   Calcium 8.6  8.4 - 09.6 mg/dL   GFR calc non Af Amer 53 (*) >90 mL/min   GFR calc Af Amer 61 (*) >90  mL/min  POCT I-STAT TROPONIN I      Component Value Range   Troponin i, poc 0.00  0.00 - 0.08 ng/mL   Comment 3           POCT I-STAT, CHEM 8      Component Value Range   Sodium 142  135 - 145 mEq/L   Potassium 3.4 (*) 3.5 - 5.1 mEq/L   Chloride 109  96 - 112 mEq/L   BUN 11  6 - 23 mg/dL   Creatinine, Ser 0.45  0.50 - 1.35 mg/dL   Glucose, Bld 409 (*) 70 - 99 mg/dL   Calcium, Ion 8.11  9.14 - 1.30 mmol/L   TCO2 23  0 - 100 mmol/L   Hemoglobin 12.9 (*) 13.0 - 17.0 g/dL   HCT 78.2 (*) 95.6 - 21.3 %    Dg Chest Port 1 View  06/29/2012  *RADIOLOGY REPORT*  Clinical Data: Chest pain, radiating into the shoulders and left arm.  PORTABLE CHEST - 1 VIEW  Comparison: Chest radiograph performed 06/26/2012  Findings: The lungs are well-aerated and clear.  There is no evidence of focal opacification, pleural effusion or pneumothorax.  The cardiomediastinal silhouette is normal in size; the patient is status post median sternotomy, with evidence of prior CABG.  No acute osseous abnormalities are seen.  Mild degenerative change is noted at the acromioclavicular joints bilaterally, more prominent on the right.  IMPRESSION: No acute cardiopulmonary process seen.   Original Report Authenticated By: Tonia Ghent, M.D.      Date: 06/29/2012  Rate: 65  Rhythm: normal sinus rhythm  QRS Axis: normal  Intervals: normal  ST/T Wave abnormalities: nonspecific ST changes  Conduction Disutrbances:none  Narrative Interpretation:   Old EKG Reviewed: none available  Aspirin and nitroglycerin prior to arrival Cardiology consult and ED evaluation  - plan admit  MDM   Chest pain in patient with known coronary artery disease and scheduled cath in 3 days presents with worsening chest pain. EKG, chest x-ray and labs reviewed as above. Cardiology consult and admission.        Sunnie Nielsen, MD 06/29/12 [phone]

## 2012-06-29 NOTE — CV Procedure (Addendum)
     Diagnostic Cardiac Catheterization Report  R Altonio Schwertner  78 y.o.  male 1926/10/15  Procedure Date: 06/29/2012 Referring Physician: Viann Fish, MD Primary Cardiologist: HWBSmith, III, MD   PROCEDURE:  Left heart catheterization with selective coronary angiography, left ventriculogram.  INDICATIONS: Class IV angina pectoris, progressive over the last 6 months not having rest pain.  The risks, benefits, and details of the procedure were explained to the patient.  The patient verbalized understanding and wanted to proceed.  Informed written consent was obtained.  PROCEDURE TECHNIQUE:  After Xylocaine anesthesia a 5 French sheath was placed in the right femoral artery with a single anterior needle wall stick.   Coronary angiography was done using a 5 [place] A2 MP and 5 [place] IMA catheter.  Left ventriculography was done using a 5 [place] A2 MP catheter.    CONTRAST:  Total of 115 cc.  COMPLICATIONS:  None.    HEMODYNAMICS:  Aortic pressure was 95/48 mm mercury; LV pressure was 94/5 mmHg; LVEDP 10.  There was no gradient between the left ventricle and aorta.    ANGIOGRAPHIC DATA:   The left main coronary artery is heavily calcified and contains a mid shaft eccentric 90% stenosis.  The left anterior descending artery is totally occluded in the proximal vessel.  The left circumflex artery is widely patent but small territory and questionable targets for grafting.  The right coronary artery is 95% proximal totally occluded after the first acute marginal branch. The distal right coronary fills by collaterals from the left circumflex.  BYPASS GRAFT ANGIOGRAPHY:  Saphenous vein graft to the diagonal: This vein graft contains diffuse in-stent restenosis with severe, greater than 90% obstruction near the anastomosis to the diagonal.  Saphenous vein graft to the right coronary: totally occluded  LEFT VENTRICULOGRAM:  Left ventricular angiogram was done in the 30 RAO projection  and revealed abnormal left ventricular wall motion with severe inferobasal hypokinesis and an estimated ejection fraction of 45 %.   IMPRESSIONS:  1. Unstable angina due to to the development of high-grade stenosis in the left main lesion the ramus intermedius and small circumflex territory with reduced flow. The patient also has high-grade in-stent restenosis in the saphenous vein graft to the diagonal: The disease in this graft is segmental. There is no opportunity for proximal or distal protection.  2. Widely patent left internal mammary graft to the LAD  3. Total occlusion of the saphenous vein graft to the right coronary and high-grade obstruction in the saphenous vein graft to the diagonal as described above.  4. Severe obstruction in the proximal RCA which then becomes totally occluded after the acute marginal branch.   5. Left ventricular dysfunction with inferobasal and mid inferior wall severe hypokinesis and EF of 40-45%.   RECOMMENDATION:  1. Will speak with TCTS to determine if the patient has surgical options.  2. If there are no surgical options we will do PCI of the left main and the saphenous vein graft to the diagonal. May also consider PCI of the RCA.  3. For the time being we will leave the sheath in place, take the patient to the holding area, and after discussion with Dr. Dorris Fetch make a decision about definitive therapy to.

## 2012-06-29 NOTE — Consult Note (Signed)
Reason for Consult:? Redo CABG Referring Physician: Dr. Arlyss Queen Bijon Mineer is an 78 y.o. male.  HPI: 78 yo WM with known CAD with multiple prior interventions including CABG in 2000 and redo CABG in 2007. He has had PTCA of the SVG to the intermediate about a year ago. He has been having anginal pain for about 3-4 weeks triggered by exertion and cold. It was relatively stable and easily relieved by SL NTG initially. However over the 3 days PTA he began having more frequent and severe shoulder pain. On the day of admission he had a very severe episode which began in the shoulder and radiated to the chest. It was the worst pain he had ever had. Unrelieved with 6 SL NTG. His wife called 911 and he was brought to [facility]. He had another episode at rest this AM.  He underwent cardiac cath this AM where he was found to have severe native left main and 3 vessel CAD. His LIMA was patent, the SVG to the intermediate which had been stented last year had in stent restenosis and the SVG to the PD and PL was totally occluded.  He is currently pain free  Past Medical History  Diagnosis Date  . CAD (coronary artery disease) 06/26/2012    PTCA RCA 1995 CABG w. LIMA to LAD, SVG to dx 1, PD and PL March 2000 Dr. Tyrone Sage for 3VD Cypher stent SVG to RCA  2005 redo CABG 07/2005 with SVG to OM and SVG to PD-PL    . H/O prostate cancer     Prior cryoablation   . Hyperlipidemia 06/26/2012  . S/P CABG (coronary artery bypass graft)   . Villous adenoma of rectum   . Anxiety     Past Surgical History  Procedure Date  . Coronary artery bypass graft 2000, 2007    LIMA and SVGs to Diag 1 PDA PLR March 2000,  SVG to OM and SVG to PDA-PL 07/2005  . Partial colectomy   . Prostate cryoablation   . Hernia repair   . Nasal septum surgery     History reviewed. No pertinent family history.  Social History:  reports that he has quit smoking. He has never used smokeless tobacco. He reports that he drinks about .5 ounces of  alcohol per week. He reports that he does not use illicit drugs.  Allergies:  Allergies  Allergen Reactions  . Fenofibrate   . Gemfibrozil   . Red Yeast Rice (Cholestin)   . Statins   . Zetia (Ezetimibe)     Medications:  Scheduled:   . amLODipine  5 mg Oral Daily  . aspirin  324 mg Oral Daily  . atropine      . bimatoprost  1 drop Both Eyes BID  . isosorbide mononitrate  120 mg Oral Daily  . metoprolol  75 mg Oral BID  . omega-3 acid ethyl esters  1 g Oral BID  . ranolazine  1,000 mg Oral Daily  . timolol  1 drop Both Eyes Daily    Results for orders placed during the hospital encounter of 06/29/12 (from the past 48 hour(s))  CBC     Status: Abnormal   Collection Time   06/29/12 12:14 AM      Component Value Range Comment   WBC 4.5  4.0 - 10.5 K/uL    RBC 4.10 (*) 4.22 - 5.81 MIL/uL    Hemoglobin 12.6 (*) 13.0 - 17.0 g/dL    HCT 16.1 (*) 09.6 -  52.0 %    MCV 87.6  78.0 - 100.0 fL    MCH 30.7  26.0 - 34.0 pg    MCHC 35.1  30.0 - 36.0 g/dL    RDW 16.1 (*) 09.6 - 15.5 %    Platelets 94 (*) 150 - 400 K/uL   BASIC METABOLIC PANEL     Status: Abnormal   Collection Time   06/29/12 12:14 AM      Component Value Range Comment   Sodium 141  135 - 145 mEq/L    Potassium 3.4 (*) 3.5 - 5.1 mEq/L    Chloride 107  96 - 112 mEq/L    CO2 23  19 - 32 mEq/L    Glucose, Bld 132 (*) 70 - 99 mg/dL    BUN 12  6 - 23 mg/dL    Creatinine, Ser 0.45  0.50 - 1.35 mg/dL    Calcium 8.6  8.4 - 40.9 mg/dL    GFR calc non Af Amer 53 (*) >90 mL/min    GFR calc Af Amer 61 (*) >90 mL/min   POCT I-STAT TROPONIN I     Status: Normal   Collection Time   06/29/12 12:39 AM      Component Value Range Comment   Troponin i, poc 0.00  0.00 - 0.08 ng/mL    Comment 3            POCT I-STAT, CHEM 8     Status: Abnormal   Collection Time   06/29/12 12:40 AM      Component Value Range Comment   Sodium 142  135 - 145 mEq/L    Potassium 3.4 (*) 3.5 - 5.1 mEq/L    Chloride 109  96 - 112 mEq/L    BUN  11  6 - 23 mg/dL    Creatinine, Ser 8.11  0.50 - 1.35 mg/dL    Glucose, Bld 914 (*) 70 - 99 mg/dL    Calcium, Ion 7.82  9.56 - 1.30 mmol/L    TCO2 23  0 - 100 mmol/L    Hemoglobin 12.9 (*) 13.0 - 17.0 g/dL    HCT 21.3 (*) 08.6 - 52.0 %   TROPONIN I     Status: Normal   Collection Time   06/29/12  8:49 AM      Component Value Range Comment   Troponin I <0.30  <0.30 ng/mL   CBC     Status: Abnormal   Collection Time   06/29/12  8:51 AM      Component Value Range Comment   WBC 4.4  4.0 - 10.5 K/uL    RBC 3.91 (*) 4.22 - 5.81 MIL/uL    Hemoglobin 12.2 (*) 13.0 - 17.0 g/dL    HCT 57.8 (*) 46.9 - 52.0 %    MCV 87.5  78.0 - 100.0 fL    MCH 31.2  26.0 - 34.0 pg    MCHC 35.7  30.0 - 36.0 g/dL    RDW 62.9 (*) 52.8 - 15.5 %    Platelets 81 (*) 150 - 400 K/uL CONSISTENT WITH PREVIOUS RESULT  BASIC METABOLIC PANEL     Status: Abnormal   Collection Time   06/29/12  8:51 AM      Component Value Range Comment   Sodium 143  135 - 145 mEq/L    Potassium 3.3 (*) 3.5 - 5.1 mEq/L    Chloride 109  96 - 112 mEq/L    CO2 23  19 - 32 mEq/L  Glucose, Bld 89  70 - 99 mg/dL    BUN 11  6 - 23 mg/dL    Creatinine, Ser 9.60  0.50 - 1.35 mg/dL    Calcium 8.3 (*) 8.4 - 10.5 mg/dL    GFR calc non Af Amer 59 (*) >90 mL/min    GFR calc Af Amer 68 (*) >90 mL/min   PROTIME-INR     Status: Abnormal   Collection Time   06/29/12  8:51 AM      Component Value Range Comment   Prothrombin Time 16.5 (*) 11.6 - 15.2 seconds    INR 1.37  0.00 - 1.49   POCT ACTIVATED CLOTTING TIME     Status: Normal   Collection Time   06/29/12  1:23 PM      Component Value Range Comment   Activated Clotting Time 209     TROPONIN I     Status: Normal   Collection Time   06/29/12  2:25 PM      Component Value Range Comment   Troponin I <0.30  <0.30 ng/mL   POCT ACTIVATED CLOTTING TIME     Status: Normal   Collection Time   06/29/12  2:51 PM      Component Value Range Comment   Activated Clotting Time 170       Dg  Chest Port 1 View  06/29/2012  *RADIOLOGY REPORT*  Clinical Data: Chest pain, radiating into the shoulders and left arm.  PORTABLE CHEST - 1 VIEW  Comparison: Chest radiograph performed 06/26/2012  Findings: The lungs are well-aerated and clear.  There is no evidence of focal opacification, pleural effusion or pneumothorax.  The cardiomediastinal silhouette is normal in size; the patient is status post median sternotomy, with evidence of prior CABG.  No acute osseous abnormalities are seen.  Mild degenerative change is noted at the acromioclavicular joints bilaterally, more prominent on the right.  IMPRESSION: No acute cardiopulmonary process seen.   Original Report Authenticated By: Tonia Ghent, M.D.     Review of Systems  Constitutional: Positive for malaise/fatigue.  HENT: Positive for hearing loss (left).   Eyes:       Glasses  Respiratory: Negative for cough, hemoptysis, shortness of breath and wheezing.   Cardiovascular: Negative for leg swelling and PND.  Genitourinary:       Erectile dysfunction, prostatism  Musculoskeletal: Positive for back pain.  All other systems reviewed and are negative.   Blood pressure 104/56, pulse 51, temperature 97.6 F (36.4 C), temperature source Oral, resp. rate 13, height 5\' 7"  (1.702 m), weight 173 lb 15.1 oz (78.9 kg), SpO2 96.00%. Physical Exam  Vitals reviewed. Constitutional: He is oriented to person, place, and time. He appears well-developed and well-nourished. No distress.  HENT:  Head: Normocephalic and atraumatic.  Eyes: EOM are normal. Pupils are equal, round, and reactive to light.  Neck: Neck supple. No thyromegaly present.       Faint right carotid bruit  Cardiovascular: Normal rate, regular rhythm and normal heart sounds.  Exam reveals no friction rub.   No murmur heard. Respiratory: Breath sounds normal. He has no wheezes.  GI: Soft. There is no tenderness.  Musculoskeletal: Normal range of motion. He exhibits no edema.    Lymphadenopathy:    He has no cervical adenopathy.  Neurological: He is alert and oriented to person, place, and time. No cranial nerve deficit.  Skin: Skin is warm and dry.    Assessment/Plan: 78 yo WM with severe native and graft CAD  who presented with an unstable coronary syndrome. He now has native 3 vessel and left main disease. The LM lesion is new. He has an occluded SVG to the PD and PL which was severely stenosed at his last cath. The SVG to the intermediate has an in stent restenosis in addition to other disease.  He has had 2 prior CABG procedures with vein taken from both legs. His left Allen's test is equivocal. Will need vein mapping and doppler of the left palmar arch to see if there is any conduit available for use as grafts. His targets do appear graftable by cath.  This is a difficult scenario given that he has already had a CABG and a redo CABG. The prospect of a third CABG in a gentleman of his age is daunting and the chance of complications/ poor outcome are very high. That being said I do not see any absolute contraindications in his case.   I discussed these issues with Dr. Katrinka Blazing and the patient and his spouse. I would lean towards PTCA of the LM as being the best initial approach. However, I would favor gathering data- carotids, vein map, etc and allow Drs. Tyrone Sage and Donnie Aho to assess that early next week before making a final decision. In the meantime if he were to become unstable the only viable emergency option would PCi of the LM =/- the SVG to the intermediate.  Lesli Issa C 06/29/2012, 3:36 PM

## 2012-06-29 NOTE — Progress Notes (Signed)
ANTICOAGULATION CONSULT NOTE - Follow Up Consult  Pharmacy Consult:  Heparin Indication:  ACS, possible re-do CABG  Allergies  Allergen Reactions  . Fenofibrate   . Gemfibrozil   . Red Yeast Rice (Cholestin)   . Statins   . Zetia (Ezetimibe)     Patient Measurements: Height: 5\' 7"  (170.2 cm) Weight: 173 lb 15.1 oz (78.9 kg) IBW/kg (Calculated) : 66.1  Heparin Dosing Weight: 79kg  Vital Signs: Temp: 97.7 F (36.5 C) (12/27 1500) Temp src: Oral (12/27 1500) BP: 116/51 mmHg (12/27 1800) Pulse Rate: 61  (12/27 1800)  Labs:  Basename 06/29/12 1425 06/29/12 0851 06/29/12 0849 06/29/12 0040 06/29/12 0014  HGB -- 12.2* -- 12.9* --  HCT -- 34.2* -- 38.0* 35.9*  PLT -- 81* -- -- 94*  APTT -- -- -- -- --  LABPROT -- 16.5* -- -- --  INR -- 1.37 -- -- --  HEPARINUNFRC -- -- -- -- --  CREATININE -- 1.12 -- 1.30 1.22  CKTOTAL -- -- -- -- --  CKMB -- -- -- -- --  TROPONINI <0.30 -- <0.30 -- --    Estimated Creatinine Clearance: 46.7 ml/min (by C-G formula based on Cr of 1.12).      Assessment: 89 YOM with significant CAD history, now s/p cath and to resume IV heparin while awaiting decision for possible re-do CABG.  IV heparin originally ordered to resume 4 hrs post sheath removal, now changed to 8 hrs post sheath removal.  Verified with RN that sheath was removed at 1630 and there is no hematoma/bleeding.  Noted patient continues to have chest pain.   Goal of Therapy:  Heparin level 0.3-0.7 units/ml Monitor platelets by anticoagulation protocol: Yes    Plan:  - On 06/30/12 at 0030, resume heparin gtt at 1000 units/hr - Check 8 hr HL - Daily HL / CBC    Niel Peretti D. Laney Potash, PharmD, BCPS Pager:  [phone] 06/29/2012, 7:08 PM

## 2012-06-29 NOTE — Progress Notes (Signed)
Pt c/o severe 10/10 Chest Pain.  O2 applied at 2L/[place] EKG obtained 1 SL NTG with some relief then 2mg  morphine given IV per PRN orders.  Pt is now pain free.  BP 125/75 HR 70. Pt currently has heparin infusing.  Will continue to monitor. Dierdre Highman, RN

## 2012-06-29 NOTE — ED Notes (Addendum)
Pt states bilateral sholder pain radiating to chest began at approx 2130, pt took 6SL nitro at home, states this relieved the CP. EMS gave 324 ASA en route, applied 2L O2 via [place]. Pt denies CP and SOB at this time. Pt has hx of CABG. Pt states "I am scheduled for heart surgery on Monday"

## 2012-06-30 ENCOUNTER — Other Ambulatory Visit: Payer: Self-pay

## 2012-06-30 DIAGNOSIS — D696 Thrombocytopenia, unspecified: Secondary | ICD-10-CM | POA: Diagnosis present

## 2012-06-30 LAB — CBC
HCT: 33.5 % — ABNORMAL LOW (ref 39.0–52.0)
Hemoglobin: 11.7 g/dL — ABNORMAL LOW (ref 13.0–17.0)
RBC: 3.78 MIL/uL — ABNORMAL LOW (ref 4.22–5.81)
WBC: 5.3 10*3/uL (ref 4.0–10.5)

## 2012-06-30 LAB — HEPARIN LEVEL (UNFRACTIONATED): Heparin Unfractionated: <0.1 IU/mL — ABNORMAL LOW (ref 0.30–0.70)

## 2012-06-30 MED ORDER — HEPARIN (PORCINE) IN NACL 100-0.45 UNIT/ML-% IJ SOLN
1400.0000 [IU]/h | INTRAMUSCULAR | Status: DC
  Administered 2012-07-01 – 2012-07-02 (×3): 1450 [IU]/h via INTRAVENOUS
  Administered 2012-07-03: 1400 [IU]/h via INTRAVENOUS
  Filled 2012-06-30 (×5): qty 250

## 2012-06-30 MED ORDER — RANOLAZINE ER 500 MG PO TB12
1000.0000 mg | ORAL_TABLET | Freq: Two times a day (BID) | ORAL | Status: DC
  Administered 2012-06-30 – 2012-07-04 (×8): 1000 mg via ORAL
  Filled 2012-06-30 (×10): qty 2

## 2012-06-30 MED ORDER — NITROGLYCERIN 0.4 MG SL SUBL: 0.4000 mg | SUBLINGUAL_TABLET | SUBLINGUAL | Status: DC | PRN

## 2012-06-30 MED ORDER — POTASSIUM CHLORIDE CRYS ER 20 MEQ PO TBCR
EXTENDED_RELEASE_TABLET | ORAL | Status: AC
  Filled 2012-06-30: qty 1

## 2012-06-30 MED ORDER — POTASSIUM CHLORIDE CRYS ER 20 MEQ PO TBCR
20.0000 meq | EXTENDED_RELEASE_TABLET | Freq: Once | ORAL | Status: AC
  Administered 2012-06-30: 20 meq via ORAL

## 2012-06-30 MED ORDER — HEPARIN (PORCINE) IN NACL 100-0.45 UNIT/ML-% IJ SOLN
1200.0000 [IU]/h | INTRAMUSCULAR | Status: DC
  Filled 2012-06-30: qty 250

## 2012-06-30 NOTE — Progress Notes (Signed)
Utilization review completed. Eagan Shifflett, RN, BSN. 

## 2012-06-30 NOTE — Progress Notes (Signed)
ANTICOAGULATION CONSULT NOTE - Follow Up Consult  Pharmacy Consult:  Heparin Indication:  ACS, possible re-do CABG  Allergies  Allergen Reactions  . Fenofibrate   . Gemfibrozil   . Red Yeast Rice (Cholestin)   . Statins   . Zetia (Ezetimibe)     Patient Measurements: Height: 5\' 7"  (170.2 cm) Weight: 173 lb 15.1 oz (78.9 kg) IBW/kg (Calculated) : 66.1  Heparin Dosing Weight: 79kg  Vital Signs: Temp: 97.8 F (36.6 C) (12/28 1136) Temp src: Oral (12/28 1136) BP: 111/49 mmHg (12/28 1100) Pulse Rate: 57  (12/28 0534)  Labs:  Basename 06/30/12 1110 06/30/12 0530 06/29/12 1425 06/29/12 0851 06/29/12 0849 06/29/12 0040 06/29/12 0014  HGB -- 11.7* -- 12.2* -- -- --  HCT -- 33.5* -- 34.2* -- 38.0* --  PLT -- 82* -- 81* -- -- 94*  APTT -- -- -- -- -- -- --  LABPROT -- -- -- 16.5* -- -- --  INR -- -- -- 1.37 -- -- --  HEPARINUNFRC <0.10* -- -- -- -- -- --  CREATININE -- -- -- 1.12 -- 1.30 1.22  CKTOTAL -- -- -- -- -- -- --  CKMB -- -- -- -- -- -- --  TROPONINI -- -- <0.30 -- <0.30 -- --    Estimated Creatinine Clearance: 46.7 ml/min (by C-G formula based on Cr of 1.12).      Assessment: 64 YOM with significant CAD history, s/p cath, resumed IV heparin while awaiting decision for possible re-do CABG.  Heparin level undetectable on 1000 units/hr.  Noted RN report that IV had been removed inadvertently overnight, but appears has been running okay since resumed early this AM.  No bleeding noted per chart notes.  H/H stable, platelet count low, but appears stable.   Goal of Therapy:  Heparin level 0.3-0.7 units/ml Monitor platelets by anticoagulation protocol: Yes    Plan:  - Increase IV heparin to 1200 units/hr.  Will not re-bolus for now given thrombocytopenia and cath yesterday. -Check heparin level in 8 hrs. -Daily heparin level and CBC.  Reece Leader, Pharm D 06/30/2012 12:30 PM

## 2012-06-30 NOTE — Progress Notes (Signed)
ANTICOAGULATION CONSULT NOTE - Follow Up Consult  Pharmacy Consult:  Heparin Indication:  ACS, possible re-do CABG  Allergies  Allergen Reactions  . Fenofibrate   . Gemfibrozil   . Red Yeast Rice (Cholestin)   . Statins   . Zetia (Ezetimibe)     Patient Measurements: Height: 5\' 7"  (170.2 cm) Weight: 173 lb 15.1 oz (78.9 kg) IBW/kg (Calculated) : 66.1  Heparin Dosing Weight: 79kg  Vital Signs: Temp: 97.7 F (36.5 C) (12/28 1542) Temp src: Oral (12/28 1542) BP: 127/70 mmHg (12/28 2124) Pulse Rate: 74  (12/28 2124)  Labs:  Basename 06/30/12 2035 06/30/12 1110 06/30/12 0530 06/29/12 1425 06/29/12 0851 06/29/12 0849 06/29/12 0040 06/29/12 0014  HGB -- -- 11.7* -- 12.2* -- -- --  HCT -- -- 33.5* -- 34.2* -- 38.0* --  PLT -- -- 82* -- 81* -- -- 94*  APTT -- -- -- -- -- -- -- --  LABPROT -- -- -- -- 16.5* -- -- --  INR -- -- -- -- 1.37 -- -- --  HEPARINUNFRC 0.13* <0.10* -- -- -- -- -- --  CREATININE -- -- -- -- 1.12 -- 1.30 1.22  CKTOTAL -- -- -- -- -- -- -- --  CKMB -- -- -- -- -- -- -- --  TROPONINI -- -- -- <0.30 -- <0.30 -- --    Estimated Creatinine Clearance: 46.7 ml/min (by C-G formula based on Cr of 1.12).      Assessment: 36 YOM with significant CAD history, s/p cath, resumed IV heparin while awaiting decision for possible re-do CABG.  HL still subtherapeutic this PM.   Goal of Therapy:  Heparin level 0.3-0.7 units/ml Monitor platelets by anticoagulation protocol: Yes    Plan:   Increase heparin to 1450 units/hr F/u with AM level

## 2012-06-30 NOTE — Progress Notes (Addendum)
Pt ambulated to doorway of room with blood dripping from right arm; pt had removed IV and stated he was trying to find the bathroom; pt woke up confused and forgot he was in the hospital; pt reoriented and cleaned; room floor cleaned with clorox wipes; assisted pt to bathroom and then back to bed; new IV site obtained; IV medications restarted after brief interruption; new lines placed; pt denied chest pain; bed alarm on

## 2012-06-30 NOTE — Progress Notes (Signed)
Dr Katrinka Blazing notified of pt's BP low 100-110's which he was aware of on rounds this am, pt on NTG drip and due for norvasc and metoprolol. NTG drip just increased to 80mcg/hr for c/o chest pain.  Orders to be placed by Dr Katrinka Blazing, norvasc d/c'd and metoprolol will continue as ordered. Will follow BP for NTG per parameters.

## 2012-06-30 NOTE — Progress Notes (Signed)
Patient Name: [name] Date of Encounter: 06/30/2012    SUBJECTIVE: The patient had a relatively difficult night. He had several episodes of chest pain ultimately requiring IV nitroglycerin. He's had no pain since early this a.m.  He developed confusion and on one occasion around 1 AM was found up walking in the room with his IV out.  TELEMETRY:  Sinus rhythm and sinus bradycardia: Filed Vitals:   06/30/12 0300 06/30/12 0534 06/30/12 0758 06/30/12 0800  BP: 106/70 100/70  109/59  Pulse:  57    Temp:  98.9 F (37.2 C) 97.9 F (36.6 C)   TempSrc:  Oral Oral   Resp:   18   Height:      Weight:      SpO2:  93% 93%     Intake/Output Summary (Last 24 hours) at 06/30/12 0921 Last data filed at 06/30/12 0600  Gross per 24 hour  Intake 2299.2 ml  Output   1100 ml  Net 1199.2 ml    LABS: Basic Metabolic Panel:  Basename 06/29/12 0851 06/29/12 0040 06/29/12 0014  NA 143 142 --  K 3.3* 3.4* --  CL 109 109 --  CO2 23 -- 23  GLUCOSE 89 130* --  BUN 11 11 --  CREATININE 1.12 1.30 --  CALCIUM 8.3* -- 8.6  MG -- -- --  PHOS -- -- --   CBC:  Basename 06/30/12 0530 06/29/12 0851  WBC 5.3 4.4  NEUTROABS -- --  HGB 11.7* 12.2*  HCT 33.5* 34.2*  MCV 88.6 87.5  PLT 82* 81*   Cardiac Enzymes:  Basename 06/29/12 1425 06/29/12 0849  CKTOTAL -- --  CKMB -- --  CKMBINDEX -- --  TROPONINI <0.30 <0.30   Radiology/Studies:  No new findings EKG: Right bundle branch block with anterior STT-wave abnormality worse than  on the admitting EKG (this EKG was during chest pain in the middle of the night) Physical Exam: Blood pressure 109/59, pulse 57, temperature 97.9 F (36.6 C), temperature source Oral, resp. rate 18, height 5\' 7"  (1.702 m), weight 78.9 kg (173 lb 15.1 oz), SpO2 93.00%. Weight change: 8.774 kg (19 lb 5.5 oz)   S4 gallop. One of 6 systolic murmur.  Right groin unremarkable. No hematoma.  Lungs clear to auscultation and percussion.  Neurological exam is  unremarkable. Patient is alert and oriented this morning.  ASSESSMENT:  1. Significant multivessel coronary disease with new high-grade left main supplying small circumflex and ramus intermedius territory, high-grade obstruction in the saphenous vein graft to the diagonal (do to in-stent restenosis), total occlusion of the mid right coronary with high-grade obstruction in the proximal vessel before a large right ventricular branch, occluded saphenous vein graft to the right coronary, patent LIMA to the LAD. Chest pain is felt to be related to the new left main and ISR in the saphenous vein graft to the diagonal.  2. Thrombocytopenia, chronic and stable  3. Transient confusion during the night  Plan:  1. Sedation as needed to keep the patient calm  2. If he becomes unstable and we are unable to control pain he may need PCI before a final decision about surgery can be made.  3. Final decision concerning repeat CABG (this would be his third operation; now  78 years of age)  49. Watch platelet count. Continue IV heparin. Predominant bed rest.  Signed, Lesleigh Noe 06/30/2012, 9:21 AM

## 2012-07-01 LAB — HEPARIN LEVEL (UNFRACTIONATED)
Heparin Unfractionated: 0.38 IU/mL (ref 0.30–0.70)
Heparin Unfractionated: 0.35 IU/mL (ref 0.30–0.70)

## 2012-07-01 LAB — CBC
Hemoglobin: 12.3 g/dL — ABNORMAL LOW (ref 13.0–17.0)
MCH: 30.9 pg (ref 26.0–34.0)
RBC: 3.98 MIL/uL — ABNORMAL LOW (ref 4.22–5.81)

## 2012-07-01 LAB — BASIC METABOLIC PANEL
CO2: 22 mEq/L (ref 19–32)
Chloride: 109 mEq/L (ref 96–112)
Creatinine, Ser: 1.29 mg/dL (ref 0.50–1.35)
Sodium: 142 mEq/L (ref 135–145)

## 2012-07-01 NOTE — Progress Notes (Signed)
Patient Name: [name] Date of Encounter: 07/01/2012    SUBJECTIVE: He's experienced no chest pain overnight. He is inquisitive about whether he should have repeat surgery (third operation) or PCI. We discussed his options. My concern about PCI as a saphenous vein graft to the diagonal. Also have concern that at his age a third operation will be significantly higher risk than previous operations. He will speak with Dr. Jaynie Collins and Dr. Donnie Aho tomorrow  TELEMETRY:  Normal sinus rhythm.: Filed Vitals:   06/30/12 2125 07/01/12 0056 07/01/12 0545 07/01/12 0742  BP: 124/70 111/61 103/56   Pulse:      Temp:  98.3 F (36.8 C) 98 F (36.7 C) 98.9 F (37.2 C)  TempSrc:  Oral Oral Oral  Resp:    16  Height:      Weight:      SpO2:  95% 96% 96%    Intake/Output Summary (Last 24 hours) at 07/01/12 1016 Last data filed at 07/01/12 0900  Gross per 24 hour  Intake 1151.07 ml  Output   1625 ml  Net -473.93 ml    LABS: Basic Metabolic Panel:  Basename 07/01/12 0620 06/29/12 0851  NA 142 143  K 3.9 3.3*  CL 109 109  CO2 22 23  GLUCOSE 99 89  BUN 12 11  CREATININE 1.29 1.12  CALCIUM 8.5 8.3*  MG -- --  PHOS -- --   CBC:  Basename 07/01/12 0620 06/30/12 0530  WBC 5.0 5.3  NEUTROABS -- --  HGB 12.3* 11.7*  HCT 35.4* 33.5*  MCV 88.9 88.6  PLT 84* 82*   Cardiac Enzymes:  Basename 06/29/12 1425 06/29/12 0849  CKTOTAL -- --  CKMB -- --  CKMBINDEX -- --  TROPONINI <0.30 <0.30    Physical Exam: Blood pressure 103/56, pulse 74, temperature 98.9 F (37.2 C), temperature source Oral, resp. rate 16, height 5\' 7"  (1.702 m), weight 78.9 kg (173 lb 15.1 oz), SpO2 96.00%. Weight change:    No gallop or rub is heard.  Lungs are clear to  ASSESSMENT:  1. Unstable angina, do to new onset of high-grade left main disease, restenosis of the saphenous vein graft to the first diagonal, and a high-grade proximal RCA.  2. Bypass graft failure, to prior coronary bypass procedures.  The LIMA to the LAD is widely patent.  3. Thrombocytopenia, stable  Plan:  1. Discuss with Dr. Donnie Aho and Dr. Jaynie Collins in a.m.  2. Continue IV heparin, IV nitroglycerin, and sedation to prevent recurrent angina  Signed, Lesleigh Noe 07/01/2012, 10:16 AM

## 2012-07-01 NOTE — Plan of Care (Signed)
Problem: Consults Goal: Chest Pain Patient Education (See Patient Education module for education specifics.)  Outcome: Completed/Met Date Met:  07/01/12 Pt verbalizes when to call for RN if chest pain

## 2012-07-01 NOTE — Progress Notes (Addendum)
ANTICOAGULATION CONSULT NOTE - Follow Up Consult  Pharmacy Consult:  Heparin Indication:  ACS, possible re-do CABG  Allergies  Allergen Reactions  . Fenofibrate   . Gemfibrozil   . Red Yeast Rice (Cholestin)   . Statins   . Zetia (Ezetimibe)     Patient Measurements: Height: 5\' 7"  (170.2 cm) Weight: 173 lb 15.1 oz (78.9 kg) IBW/kg (Calculated) : 66.1  Heparin Dosing Weight: 79kg  Vital Signs: Temp: 98.3 F (36.8 C) (12/29 1133) Temp src: Oral (12/29 1133) BP: 107/52 mmHg (12/29 1135)  Labs:  Basename 07/01/12 1441 07/01/12 0620 06/30/12 2035 06/30/12 0530 06/29/12 1425 06/29/12 0851 06/29/12 0849 06/29/12 0040  HGB -- 12.3* -- 11.7* -- -- -- --  HCT -- 35.4* -- 33.5* -- 34.2* -- --  PLT -- 84* -- 82* -- 81* -- --  APTT -- -- -- -- -- -- -- --  LABPROT -- -- -- -- -- 16.5* -- --  INR -- -- -- -- -- 1.37 -- --  HEPARINUNFRC 0.35 0.38 0.13* -- -- -- -- --  CREATININE -- 1.29 -- -- -- 1.12 -- 1.30  CKTOTAL -- -- -- -- -- -- -- --  CKMB -- -- -- -- -- -- -- --  TROPONINI -- -- -- -- <0.30 -- <0.30 --    Estimated Creatinine Clearance: 40.6 ml/min (by C-G formula based on Cr of 1.29).   Meds:    . sodium chloride 10 mL/hr at 07/01/12 0650  . heparin 1,450 Units/hr (07/01/12 0703)  . nitroGLYCERIN 20 mcg/min (07/01/12 0701)  . [DISCONTINUED] heparin 1,200 Units/hr (06/30/12 1800)     Assessment: 85 YOM with significant CAD history, s/p cath, resumed IV heparin while awaiting decision for possible re-do CABG.  Heparin level therapeutic on 1450 units/hr.  Noted RN report that IV had to be restarted this AM. No further bleeding noted per chart notes.  H/H stable, platelet count low, but appears stable.   Goal of Therapy:  Heparin level 0.3-0.7 units/ml Monitor platelets by anticoagulation protocol: Yes    Plan:  -Continue IV heparin at current rate. -Check heparin level in 8 hrs to confirm. -Daily heparin level and CBC.  Reece Leader, Pharm D  07/01/2012 3:20 PM   Addendum -  Confirmatory heparin level still therapeutic.  Will f/u AM heparin level. Reece Leader, Pharm D 07/01/2012 3:21 PM

## 2012-07-01 NOTE — Progress Notes (Signed)
Pt's left wrist IV leaking; bloody; cleaned and attempted to save site however when flushed saline leaked out from site; removed, catheter intact; changed IV tubing and was going to move to left FA 20g IV; however, when attempting to flush, it was occluded and leaking; IV removed, catheter intact; new site obtained on right hand 22 gauge after 2 attempts on right FA;  Heparin and NTG gtt were paused for approx 45 minutes while obtaining new IV site

## 2012-07-02 ENCOUNTER — Ambulatory Visit (HOSPITAL_COMMUNITY): Admission: RE | Admit: 2012-07-02 | Payer: Medicare Other | Source: Ambulatory Visit | Admitting: Cardiology

## 2012-07-02 ENCOUNTER — Encounter (HOSPITAL_COMMUNITY): Admission: RE | Payer: Self-pay | Source: Ambulatory Visit

## 2012-07-02 DIAGNOSIS — Z01810 Encounter for preprocedural cardiovascular examination: Secondary | ICD-10-CM

## 2012-07-02 DIAGNOSIS — I2510 Atherosclerotic heart disease of native coronary artery without angina pectoris: Secondary | ICD-10-CM

## 2012-07-02 LAB — BASIC METABOLIC PANEL
CO2: 24 mEq/L (ref 19–32)
Glucose, Bld: 101 mg/dL — ABNORMAL HIGH (ref 70–99)
Potassium: 3.7 mEq/L (ref 3.5–5.1)
Sodium: 140 mEq/L (ref 135–145)

## 2012-07-02 LAB — CBC
HCT: 33.7 % — ABNORMAL LOW (ref 39.0–52.0)
Hemoglobin: 11.4 g/dL — ABNORMAL LOW (ref 13.0–17.0)
RBC: 3.77 MIL/uL — ABNORMAL LOW (ref 4.22–5.81)
WBC: 4.5 10*3/uL (ref 4.0–10.5)

## 2012-07-02 SURGERY — LEFT HEART CATHETERIZATION WITH CORONARY/GRAFT ANGIOGRAM
Anesthesia: LOCAL

## 2012-07-02 MED ORDER — SODIUM CHLORIDE 0.9 % IV SOLN: 250.0000 mL | INTRAVENOUS | Status: DC | PRN

## 2012-07-02 MED ORDER — SODIUM CHLORIDE 0.9 % IJ SOLN: 3.0000 mL | INTRAMUSCULAR | Status: DC | PRN

## 2012-07-02 MED ORDER — ASPIRIN 81 MG PO CHEW
324.0000 mg | CHEWABLE_TABLET | ORAL | Status: AC
  Administered 2012-07-03: 324 mg via ORAL

## 2012-07-02 MED ORDER — SODIUM CHLORIDE 0.9 % IJ SOLN
3.0000 mL | Freq: Two times a day (BID) | INTRAMUSCULAR | Status: DC
  Administered 2012-07-02: 3 mL via INTRAVENOUS

## 2012-07-02 MED ORDER — DIAZEPAM 2 MG PO TABS
2.0000 mg | ORAL_TABLET | ORAL | Status: AC
  Administered 2012-07-03: 2 mg via ORAL
  Filled 2012-07-02: qty 1

## 2012-07-02 MED ORDER — SODIUM CHLORIDE 0.9 % IV SOLN: 1.0000 mL/kg/h | INTRAVENOUS | Status: DC

## 2012-07-02 MED ORDER — CLOPIDOGREL BISULFATE 300 MG PO TABS
600.0000 mg | ORAL_TABLET | Freq: Once | ORAL | Status: AC
  Administered 2012-07-02: 600 mg via ORAL
  Filled 2012-07-02 (×2): qty 2

## 2012-07-02 NOTE — Progress Notes (Addendum)
VASCULAR LAB PRELIMINARY  PRELIMINARY  PRELIMINARY  PRELIMINARY  Right Lower Extremity Vein Map    Right Great Saphenous Vein   Segment Diameter Comment  1. Origin 4.4 mm   2. High Thigh mm Not visualized  3. Mid Thigh mm Not visualized  4. Low Thigh 2.7 mm   5. At Knee mm Not visualized  6. High Calf 3.1 mm lateral 2.4 medial  7. Low Calf 2.7 mm   8. Above ankle 3.9 mm    mm    mm    mm     Right Small Saphenous Vein  Segment Diameter Comment  1. Origin 3.7 mm thrombosed  2. High Calf mm   3. Low Calf mm   4. Ankle mm    mm    mm    mm   Left Lower Extremity Vein Map    Left Great Saphenous Vein   Segment Diameter Comment  1. Origin 2.9 mm   2. High Thigh mm Not visualized  3. Mid Thigh mm Not visualized  4. Low Thigh mm Not visualized  5. At Knee mm Not visualized  6. High Calf mm Not visualized  7. Low Calf mm Not visualized  8. Ankle mm Not visualized   mm    mm    mm     Left Small Saphenous Vein  Segment Diameter Comment  1. Origin 3 mm Partially compressible  2. High Calf 2.6 mm   3. Mid Calf 2 mm   4. Ankle mm Not visualized   mm    mm    mm     [name], RVT 07/02/2012, 11:06 AM

## 2012-07-02 NOTE — Progress Notes (Signed)
Subjective:  Patient with no angina on IV heparin and NTG.  Films reviewed.  Discussed with Dr. Katrinka Blazing  Objective:  Vital Signs in the last 24 hours: BP 107/65  Pulse 61  Temp 97.7 F (36.5 C) (Oral)  Resp 18  Ht 5\' 7"  (1.702 m)  Wt 78.9 kg (173 lb 15.1 oz)  BMI 27.24 kg/m2  SpO2 94%  Physical Exam: Pleasant WM in NAD Lungs:  Clear Cardiac:  Regular rhythm, normal S1 and S2, no S3 Extremities:  Cath site clean and dry  Intake/Output from previous day: 12/29 0701 - 12/30 0700 In: 1575.2 [P.O.:800; I.V.:775.2] Out: 1505 [Urine:1505] Weight Filed Weights   06/29/12 0358 06/29/12 1415  Weight: 70.126 kg (154 lb 9.6 oz) 78.9 kg (173 lb 15.1 oz)    Lab Results: Basic Metabolic Panel:  Basename 07/02/12 0600 07/01/12 0620  NA 140 142  K 3.7 3.9  CL 106 109  CO2 24 22  GLUCOSE 101* 99  BUN 13 12  CREATININE 1.34 1.29    CBC:  Basename 07/02/12 0600 07/01/12 0620  WBC 4.5 5.0  NEUTROABS -- --  HGB 11.4* 12.3*  HCT 33.7* 35.4*  MCV 89.4 88.9  PLT 82* 84*   Telemetry: Sinus rhythm  Assessment/Plan:  1. Unstable angina, due to new onset of high-grade left main disease, restenosis of the saphenous vein graft to the first diagonal, and a high-grade proximal RCA.  2. Bypass graft failure, two prior coronary bypass procedures. The LIMA to the LAD is widely patent.  3. Thrombocytopenia, stable   Rec:  Extensive discussion with patient and Dr. Katrinka Blazing.  Await surgical opinion.  I am not optimistic that PCI will give good long term relief of angina or durable results in the SVG to to int.  In addition, the RCA distal territory would not be revascularized and has been a continued source of angina despite successful PCI of the int graft before.   [name]  MD [facility]  07/02/2012, 9:36 AM

## 2012-07-02 NOTE — Progress Notes (Addendum)
ANTICOAGULATION CONSULT NOTE - Follow Up Consult  Pharmacy Consult:  Heparin Indication:  ACS, possible re-do CABG  Allergies  Allergen Reactions  . Fenofibrate   . Gemfibrozil   . Red Yeast Rice (Cholestin)   . Statins   . Zetia (Ezetimibe)    Patient Measurements: Height: 5\' 7"  (170.2 cm) Weight: 173 lb 15.1 oz (78.9 kg) IBW/kg (Calculated) : 66.1  Heparin Dosing Weight: 79kg  Vital Signs: Temp: 97.7 F (36.5 C) (12/30 0748) Temp src: Oral (12/30 0748) BP: 107/65 mmHg (12/30 0748) Pulse Rate: 61  (12/30 0748)  Labs:  Basename 07/02/12 0600 07/01/12 1441 07/01/12 0620 06/30/12 0530 06/29/12 1425 06/29/12 0851 06/29/12 0849  HGB 11.4* -- 12.3* -- -- -- --  HCT 33.7* -- 35.4* 33.5* -- -- --  PLT 82* -- 84* 82* -- -- --  APTT -- -- -- -- -- -- --  LABPROT -- -- -- -- -- 16.5* --  INR -- -- -- -- -- 1.37 --  HEPARINUNFRC 0.46 0.35 0.38 -- -- -- --  CREATININE 1.34 -- 1.29 -- -- 1.12 --  CKTOTAL -- -- -- -- -- -- --  CKMB -- -- -- -- -- -- --  TROPONINI -- -- -- -- <0.30 -- <0.30   Estimated Creatinine Clearance: 39.1 ml/min (by C-G formula based on Cr of 1.34).  Meds:    . sodium chloride 10 mL/hr at 07/02/12 0700  . heparin 1,450 Units/hr (07/02/12 0700)  . nitroGLYCERIN 20 mcg/min (07/02/12 0700)   Assessment: 85 YOM with significant CAD history, s/p cath, resumed IV heparin while awaiting decision for possible re-do CABG.  Heparin level therapeutic on 1450 units/hr.  No bleeding noted per chart notes.  H/H stable, thrombocytopenia present but appears stable.  Home meds reviewed - on Clopidogrel PTA (holding for potential re-do CABG), no ACE (EF - 40-45% via cath report 12/27)  Goal of Therapy:  Heparin level 0.3-0.7 units/ml Monitor platelets by anticoagulation protocol: Yes   Plan:  - Continue IV heparin at current rate. - Check heparin level in and CBC daily. -  Follow up plans for intervention.  Nadara Mustard, PharmD., MS Clinical  Pharmacist Pager:  [phone]  07/02/2012 08:28AM Thank you for allowing pharmacy to be part of this patients care team.

## 2012-07-02 NOTE — Progress Notes (Signed)
Await the opinion of Dr. Donnie Aho and Dr. Tyrone Sage. Potentially perform PCI in a.m. if he is not a surgical candidate.

## 2012-07-02 NOTE — Progress Notes (Signed)
I reviewed the patient's angiograms. I have spoken with Dr. Tyrone Sage, Dr. Donnie Aho, the patient and family. We will proceed in the a.m. with PCI/stenting of the left main, followed by the saphenous vein graft to the diagonal and possibly the proximal RCA.  Discussed with the patient and family the high risk nature of the procedure and the unlikely availability of emergency surgery if we have failed PCI attempt.  Discussed the increased risk of death, stroke, bleeding, myocardial infarction, bleeding, among other typical potential complications. The patient is willing to proceed with this approach.

## 2012-07-02 NOTE — Progress Notes (Signed)
Patient ID: [name], male   DOB: [birth year], 78 y.o.   MRN: [id number] .                   [address]            [place] [address]          [phone]     3 Days Post-Op Procedure(s) (LRB): LEFT HEART CATHETERIZATION WITH CORONARY/GRAFT ANGIOGRAM (N/A)  LOS: 3 days   Subjective: No chest pain, since Friday   Objective: Vital signs in last 24 hours: Patient Vitals for the past 24 hrs:  BP Temp Temp src Pulse Resp SpO2  07/02/12 1229 114/58 mmHg 97.9 F (36.6 C) Oral 71  - 95 %  07/02/12 0748 107/65 mmHg 97.7 F (36.5 C) Oral 61  - 94 %  07/02/12 0430 102/64 mmHg - - - - -  07/02/12 0400 - 97.8 F (36.6 C) Oral - - 96 %  07/02/12 0000 - 98.1 F (36.7 C) Oral - - 97 %  07/01/12 2355 99/52 mmHg - - - - -  07/01/12 2041 - - - 63  - -  07/01/12 2021 114/59 mmHg 98.1 F (36.7 C) Oral - 18  92 %    Filed Weights   06/29/12 0358 06/29/12 1415  Weight: 154 lb 9.6 oz (70.126 kg) 173 lb 15.1 oz (78.9 kg)    Hemodynamic parameters for last 24 hours:    Intake/Output from previous day: 12/29 0701 - 12/30 0700 In: 1575.2 [P.O.:800; I.V.:775.2] Out: 1505 [Urine:1505] Intake/Output this shift: Total I/O In: 634.5 [P.O.:360; I.V.:274.5] Out: -   Scheduled Meds:   . aspirin  324 mg Oral Daily  . aspirin  324 mg Oral Pre-Cath  . bimatoprost  1 drop Both Eyes BID  . diazepam  5 mg Oral On Call  . metoprolol  75 mg Oral BID  . omega-3 acid ethyl esters  1 g Oral BID  . ranolazine  1,000 mg Oral BID  . timolol  1 drop Both Eyes Daily   Continuous Infusions:   . sodium chloride 10 mL/hr at 07/02/12 0700  . heparin 1,450 Units/hr (07/02/12 0700)  . nitroGLYCERIN 20 mcg/min (07/02/12 0700)   PRN Meds:.acetaminophen, LORazepam, morphine injection, nitroGLYCERIN, ondansetron (ZOFRAN) IV  General appearance: alert and cooperative Neurologic: intact Heart: regular rate and rhythm, S1, S2 normal, no murmur, click, rub or gallop and normal apical  impulse Lungs: clear to auscultation bilaterally Abdomen: soft, non-tender; bowel sounds normal; no masses,  no organomegaly Extremities: extremities normal, atraumatic, no cyanosis or edema and vein harvested from both legs in past venous doppler pending Wound: sternum stable  Lab Results: CBC: Basename 07/02/12 0600 07/01/12 0620  WBC 4.5 5.0  HGB 11.4* 12.3*  HCT 33.7* 35.4*  PLT 82* 84*   BMET:  Basename 07/02/12 0600 07/01/12 0620  NA 140 142  K 3.7 3.9  CL 106 109  CO2 24 22  GLUCOSE 101* 99  BUN 13 12  CREATININE 1.34 1.29  CALCIUM 8.5 8.5    PT/INR: No results found for this basename: LABPROT,INR in the last 72 hours   Radiology No results found.   Assessment/Plan: S/P Procedure(s) (LRB): LEFT HEART CATHETERIZATION WITH CORONARY/GRAFT ANGIOGRAM (N/A) I have reviewed the patients films and discussed with Dr Katrinka Blazing. Difficulty situation with 3 time redo and 78 years old. Now with left main  stenosis and occulusion  of  Right graft. The left main stenosis was not  present on previous cath, Circumflex system is not approachable with CABG. Patient needs the least invasive procedure to get his angina manageable.  Dr Katrinka Blazing is agreeable to stenting left main, with patient LIMA to LAD   Delight Ovens MD 07/02/2012 4:54 PM

## 2012-07-03 ENCOUNTER — Other Ambulatory Visit: Payer: Self-pay

## 2012-07-03 ENCOUNTER — Encounter (HOSPITAL_COMMUNITY)
Admission: EM | Disposition: A | Discharge status: Home / Self Care | Payer: Self-pay | Source: Home / Self Care | Attending: Cardiology

## 2012-07-03 DIAGNOSIS — I2510 Atherosclerotic heart disease of native coronary artery without angina pectoris: Secondary | ICD-10-CM

## 2012-07-03 HISTORY — PX: PERCUTANEOUS CORONARY STENT INTERVENTION (PCI-S): SHX5485

## 2012-07-03 LAB — BASIC METABOLIC PANEL
BUN: 11 mg/dL (ref 6–23)
Calcium: 8.6 mg/dL (ref 8.4–10.5)
Potassium: 3.7 mEq/L (ref 3.5–5.1)

## 2012-07-03 LAB — CBC
MCV: 89.5 fL (ref 78.0–100.0)
Platelets: 68 10*3/uL — ABNORMAL LOW (ref 150–400)
RBC: 3.52 MIL/uL — ABNORMAL LOW (ref 4.22–5.81)
RDW: 16.4 % — ABNORMAL HIGH (ref 11.5–15.5)
WBC: 4.6 10*3/uL (ref 4.0–10.5)

## 2012-07-03 LAB — HEPARIN LEVEL (UNFRACTIONATED): Heparin Unfractionated: 0.6 IU/mL (ref 0.30–0.70)

## 2012-07-03 SURGERY — PERCUTANEOUS CORONARY STENT INTERVENTION (PCI-S)
Anesthesia: LOCAL

## 2012-07-03 MED ORDER — OXYCODONE-ACETAMINOPHEN 5-325 MG PO TABS: 1.0000 | ORAL_TABLET | ORAL | Status: DC | PRN

## 2012-07-03 MED ORDER — CLOPIDOGREL BISULFATE 75 MG PO TABS: 75.0000 mg | ORAL_TABLET | Freq: Every day | ORAL | Status: DC

## 2012-07-03 MED ORDER — MIDAZOLAM HCL 2 MG/2ML IJ SOLN
INTRAMUSCULAR | Status: AC
  Filled 2012-07-03: qty 2

## 2012-07-03 MED ORDER — DIPHENHYDRAMINE HCL 50 MG/ML IJ SOLN
INTRAMUSCULAR | Status: AC
  Filled 2012-07-03: qty 1

## 2012-07-03 MED ORDER — NITROGLYCERIN 0.2 MG/ML ON CALL CATH LAB
INTRAVENOUS | Status: AC
  Filled 2012-07-03: qty 1

## 2012-07-03 MED ORDER — HEPARIN (PORCINE) IN NACL 2-0.9 UNIT/ML-% IJ SOLN
INTRAMUSCULAR | Status: AC
  Filled 2012-07-03: qty 500

## 2012-07-03 MED ORDER — HEPARIN (PORCINE) IN NACL 2-0.9 UNIT/ML-% IJ SOLN
INTRAMUSCULAR | Status: AC
  Filled 2012-07-03: qty 1000

## 2012-07-03 MED ORDER — OMEGA-3-ACID ETHYL ESTERS 1 G PO CAPS
1.0000 g | ORAL_CAPSULE | Freq: Two times a day (BID) | ORAL | Status: DC
  Administered 2012-07-03 – 2012-07-04 (×2): 1 g via ORAL
  Filled 2012-07-03 (×3): qty 1

## 2012-07-03 MED ORDER — FENTANYL CITRATE 0.05 MG/ML IJ SOLN
INTRAMUSCULAR | Status: AC
  Filled 2012-07-03: qty 2

## 2012-07-03 MED ORDER — BIVALIRUDIN 250 MG IV SOLR
INTRAVENOUS | Status: AC
  Filled 2012-07-03: qty 250

## 2012-07-03 MED ORDER — CLOPIDOGREL BISULFATE 75 MG PO TABS
75.0000 mg | ORAL_TABLET | Freq: Every day | ORAL | Status: DC
  Administered 2012-07-03 – 2012-07-04 (×2): 75 mg via ORAL
  Filled 2012-07-03 (×3): qty 1

## 2012-07-03 MED ORDER — LIDOCAINE HCL (PF) 1 % IJ SOLN
INTRAMUSCULAR | Status: AC
  Filled 2012-07-03: qty 30

## 2012-07-03 MED ORDER — TIMOLOL MALEATE 0.5 % OP SOLN
1.0000 [drp] | Freq: Every day | OPHTHALMIC | Status: DC
  Administered 2012-07-03 – 2012-07-04 (×2): 1 [drp] via OPHTHALMIC
  Filled 2012-07-03: qty 5

## 2012-07-03 MED ORDER — ACETAMINOPHEN 325 MG PO TABS: 650.0000 mg | ORAL_TABLET | ORAL | Status: DC | PRN

## 2012-07-03 MED ORDER — CEFAZOLIN SODIUM-DEXTROSE 2-3 GM-% IV SOLR
2.0000 g | INTRAVENOUS | Status: DC
  Filled 2012-07-03: qty 50

## 2012-07-03 MED ORDER — DIPHENHYDRAMINE HCL 50 MG/ML IJ SOLN
12.5000 mg | Freq: Once | INTRAMUSCULAR | Status: AC
  Administered 2012-07-03: 12.5 mg via INTRAVENOUS

## 2012-07-03 MED ORDER — LATANOPROST 0.005 % OP SOLN
1.0000 [drp] | Freq: Every day | OPHTHALMIC | Status: DC
  Administered 2012-07-03: 22:00:00 1 [drp] via OPHTHALMIC
  Filled 2012-07-03: qty 2.5

## 2012-07-03 MED ORDER — ONDANSETRON HCL 4 MG/2ML IJ SOLN: 4.0000 mg | Freq: Four times a day (QID) | INTRAMUSCULAR | Status: DC | PRN

## 2012-07-03 MED ORDER — MORPHINE SULFATE 2 MG/ML IJ SOLN: 2.0000 mg | INTRAMUSCULAR | Status: DC | PRN

## 2012-07-03 MED ORDER — ASPIRIN EC 325 MG PO TBEC
325.0000 mg | DELAYED_RELEASE_TABLET | Freq: Every day | ORAL | Status: DC
  Administered 2012-07-04: 325 mg via ORAL
  Filled 2012-07-03: qty 1

## 2012-07-03 MED ORDER — SODIUM CHLORIDE 0.9 % IV SOLN
INTRAVENOUS | Status: AC
  Administered 2012-07-03: 15:00:00 via INTRAVENOUS

## 2012-07-03 NOTE — Progress Notes (Signed)
ANTICOAGULATION CONSULT NOTE - Follow Up Consult  Pharmacy Consult:  Heparin Indication:  ACS, possible re-do CABG  Allergies  Allergen Reactions  . Fenofibrate   . Gemfibrozil   . Red Yeast Rice (Cholestin)   . Statins   . Zetia (Ezetimibe)    Patient Measurements: Height: 5\' 7"  (170.2 cm) Weight: 168 lb 14 oz (76.6 kg) IBW/kg (Calculated) : 66.1  Heparin Dosing Weight: 79kg  Vital Signs: Temp: 98.2 F (36.8 C) (12/31 0750) Temp src: Oral (12/31 0750) BP: 90/49 mmHg (12/31 0750) Pulse Rate: 54  (12/31 0750)  Labs:  Basename 07/03/12 0430 07/02/12 0600 07/01/12 1441 07/01/12 0620  HGB 10.9* 11.4* -- --  HCT 31.5* 33.7* -- 35.4*  PLT 68* 82* -- 84*  APTT -- -- -- --  LABPROT -- -- -- --  INR -- -- -- --  HEPARINUNFRC 0.60 0.46 0.35 --  CREATININE 1.36* 1.34 -- 1.29  CKTOTAL -- -- -- --  CKMB -- -- -- --  TROPONINI -- -- -- --   Estimated Creatinine Clearance: 38.5 ml/min (by C-G formula based on Cr of 1.36).  Meds:    . sodium chloride 10 mL/hr at 07/02/12 0700  . sodium chloride 1 mL/kg/hr (07/02/12 2130)  . heparin 1,450 Units/hr (07/02/12 2031)  . nitroGLYCERIN 20 mcg/min (07/02/12 2030)   Assessment: 59 YOM with significant CAD history, s/p cath, resumed IV heparin while awaiting decision for possible re-do CABG.  The decision now is to attempt PCI for his multi-vessel disease due to high risk of surgery.  He is therapeutic with his IV heparin this morning with level of 0.6 IU/ml.    H/H stable, thrombocytopenia present and platelets have dropped a little more but no overt bleeding noted.  His baseline platelets are 80's.  Home meds reviewed - on Clopidogrel PTA (holding for potential re-do CABG), no ACE (EF - 40-45% via cath report 12/27)  Goal of Therapy:  Heparin level 0.3-0.7 units/ml Monitor platelets by anticoagulation protocol: Yes   Plan:  -  Decrease IV heparin to 1400 units/hr. -  Check heparin level in and CBC daily.  Nadara Mustard,  PharmD., MS Clinical Pharmacist Pager:  [phone]  07/02/2012 08:28AM Thank you for allowing pharmacy to be part of this patients care team.

## 2012-07-03 NOTE — Progress Notes (Signed)
Subjective:  No angina overnight.  Not SOB  Objective:  Vital Signs in the last 24 hours: BP 90/49  Pulse 54  Temp 98.2 F (36.8 C) (Oral)  Resp 16  Ht 5\' 7"  (1.702 m)  Wt 76.6 kg (168 lb 14 oz)  BMI 26.45 kg/m2  SpO2 96%  Physical Exam: Pleasant WM in NAD Lungs:  Clear Cardiac:  Regular rhythm, normal S1 and S2, no S3  Intake/Output from previous day: 12/30 0701 - 12/31 0700 In: 2062.2 [P.O.:800; I.V.:1262.2] Out: 300 [Urine:300] Weight Filed Weights   06/29/12 0358 06/29/12 1415 07/02/12 2120  Weight: 70.126 kg (154 lb 9.6 oz) 78.9 kg (173 lb 15.1 oz) 76.6 kg (168 lb 14 oz)    Lab Results: Basic Metabolic Panel:  Basename 07/03/12 0430 07/02/12 0600  NA 140 140  K 3.7 3.7  CL 109 106  CO2 24 24  GLUCOSE 101* 101*  BUN 11 13  CREATININE 1.36* 1.34    CBC:  Basename 07/03/12 0430 07/02/12 0600  WBC 4.6 4.5  NEUTROABS -- --  HGB 10.9* 11.4*  HCT 31.5* 33.7*  MCV 89.5 89.4  PLT 68* 82*   Telemetry: Sinus rhythm  Assessment/Plan:  1. Unstable angina, due to new onset of high-grade left main disease, restenosis of the saphenous vein graft to the first diagonal, and a high-grade proximal RCA.  2. Bypass graft failure, two prior coronary bypass procedures. The LIMA to the LAD is widely patent.  3. Thrombocytopenia worsened some.  Need to get off of heparin. Rec:  Long discussion yesterday with Dr. Katrinka Blazing, Dr. Tyrone Sage, and patient. Dr. Tyrone Sage is reluctant to do redo CABG which would give best long term solution for relief of angina.  Hope now is that PCI of left main and possible the graft to the intermediate would reduce the angina to a more manageable status, but patient and family understand that he will likely still have angina due to incomplete revascularization of the RCA.   [name]  MD [facility]  07/03/2012, 8:11 AM

## 2012-07-03 NOTE — H&P (Signed)
As noted previously in the chart, the patient is not felt to be a good surgical candidate. He's had refractory angina due to development of new left main disease and restenosis of saphenous vein graft to the diagonal. As a pillow that measure we plan to dilate the saphenous vein graft after we have primarily stented the left main. The procedure has high risk. The patient understands this. The family and patient understands that he is probably not a candidate for emergency [place] if we fail. All in all willing to proceed. The platelet count this morning is 68,000.

## 2012-07-03 NOTE — CV Procedure (Signed)
PERCUTANEOUS CORONARY INTERVENTION   [name] is a 78 y.o. male  INDICATION: 78 year old gentleman with class IV angina/unstable angina and bypass graft failure. 2 previous coronary bypass graft surgeries most recently 2005. Not felt to be a repeat surgical candidate. This procedure is being done to alleviate symptoms.   PROCEDURE: 1. DES implantation left main body  2. Angioplasty and stenting of restenosis in the saphenous vein graft to the ramus intermedius/diagonal  3. Drug-eluting stent implantation proximal RCA   CONSENT: The risks, benefits, and details of the procedure were explained to the patient. Risks including death, MI, stroke, bleeding, limb ischemia, renal failure and allergy were described and accepted by the patient.  Informed written consent was obtained prior to proceeding.  PROCEDURE TECHNIQUE:  After Xylocaine anesthesia a 6 French sheath was placed in the right femoral artery with a single anterior needle wall stick.   Coronary guiding shots were made using a 6 [place] XP LAD 3.5 cm catheter. Antithrombotic therapy, bivalirudin bolus and infusion, was begun and determined to be therapeutic by ACT. Antiplatelet therapy, Plavix 600 mg, was loaded 18 hours ago.  We crossed the stenosis in the left main using a Prow[place] guidewire. Predilatation was performed using a 3.0 x 12 mm long emergent balloon. We then positioned and deployed a 3.5 mm x 15 mm Xience Expedition drug-eluting. Postdilatation was performed to 15 atmospheres using a 4.0 x 9 mm long [place] sprinter. A nice angiographic result was obtained. I was unable to high this the result because we were unable to advance I guidewires deep enough into branches to tract that this catheter.  We turned our attention to the saphenous vein graft to the ramus intermedius. The pro water guidewire was used to cross the stenosis after guiding shots were obtained with a AL-1cm 6 French left coronary system guide catheter. We  predilated using a 2.75 x 15 mm long Trek balloon. We then positioned and deployed a 2.5 mm diameter by 18 mm long Xience Xpedition stent to 18 atmospheres. A 3.25 mm by 20 mm long balloon [place] Trek balloon was then used to post dilatate to 12 atmospheres throughout the entire region of stent in the mid to distal graft. I did not treat moderate new disease proximal to the stents as this will require distal protection and with fresh the stents in the distal vessel, a protection device was contraindicated. We did notice that contrast refluxed back through to the left main. Therefore, left main stenting may actually also supply improved blood flow to this territory even if the graft closes..  We then turned our attention to the native right coronary. A 6 [place] JR guide catheter was used to obtain having shots. The Prowater wire was advanced through the stenosis. We predilated with a 2.25 x 12 mm Trek balloon. We then positioned and deployed a 2.25 mm x 12 mm long Xience Xpedition drug-eluting stent to 12 atmospheres. Postdilatation was performed with a 2.5 x 8 mm long [place] Trek to 14 atmospheres.  Closure of the arteriotomy site using Angio-Seal was completed successfully to  CONTRAST:  Total of 315 cc.  COMPLICATIONS:  None.    ANGIOGRAPHIC RESULTS:   1. 90% left main reduced to 0% with TIMI grade 3 flow. Final balloon diameter 4 mm to  2. 99% ISR of the distal saphenous vein graft to the ramus intermedius reduced to 0% with a 2.5 mm x 18 Xience Xpedition post dilated to 3.25 mm diameter with resultant TIMI  grade 3 flow.  3. 95% stenosis in the native right coronary reduced to 0% after implantation of a 2.25 x 12 mm DES postdilated to 2.5 mm .   IMPRESSIONS:  1. Successful multi-site PCI with drug-eluting stents as noted above.   RECOMMENDATION:  1. Discontinue IV heparin  2. Discontinue bivalirudin  3. Discontinue IV nitroglycerin  4. Ambulate later this evening with hopeful possibility of  discharge in a.m.  5. Check platelet count in a.m..    Lesleigh Noe, MD 07/03/2012 11:50 AM

## 2012-07-04 LAB — BASIC METABOLIC PANEL
BUN: 12 mg/dL (ref 6–23)
Chloride: 107 mEq/L (ref 96–112)
Glucose, Bld: 100 mg/dL — ABNORMAL HIGH (ref 70–99)
Potassium: 4 mEq/L (ref 3.5–5.1)
Sodium: 141 mEq/L (ref 135–145)

## 2012-07-04 LAB — CBC
HCT: 35.8 % — ABNORMAL LOW (ref 39.0–52.0)
Hemoglobin: 12.4 g/dL — ABNORMAL LOW (ref 13.0–17.0)
RBC: 4.01 MIL/uL — ABNORMAL LOW (ref 4.22–5.81)
WBC: 5.6 10*3/uL (ref 4.0–10.5)

## 2012-07-04 MED ORDER — ASPIRIN 325 MG PO TBEC: 325.0000 mg | DELAYED_RELEASE_TABLET | Freq: Every day | ORAL | Status: DC

## 2012-07-04 NOTE — Discharge Summary (Signed)
Patient ID: [name] MRN: [id number] DOB/AGE: 1927/03/28 79 y.o.  Admit date: 07/23/12 Discharge date: 07/04/2012  Patient Active Problem List  Diagnosis  . CAD (coronary artery disease)  . S/P CABG (coronary artery bypass graft)  . Hyperlipidemia  . History of prostate cancer  . HIstory of villous adenoma of ascending colon  . Unstable angina  . Thrombocytopenia   Primary Discharge Diagnosis: Unstable angina pectoris, class IV  Secondary Discharge Diagnosis: Coronary artery disease with 2 previous coronary bypass graft operations. Recurrent bypass graft failure and progression of native disease documented this admission.  Diabetes mellitus  History of prostate cancer  History of thrombocytopenia, with platelet count of around 80,000 and stable  Significant Diagnostic Studies: 1. Coronary angiography Jul 23, 2012  2. Multivessel PCI/stent procedure 07/03/2012  Consults: TCTS, Dr. Merian Capron and Dr. Chauncy Passy Course: The patient was admitted to the hospital with angina pectoris occurring at rest. He underwent repeat cardiac catheterization after ruling out for myocardial infarction. He was found to have total occlusion of saphenous vein graft to the right coronary, high-grade restenosis in the saphenous vein graft to the ramus intermedius, new severe left main disease, 90% obstruction in the proximal right coronary which was then totally occluded after the first acute marginal branch. He was reevaluated for the possibility of coronary bypass grafting but felt to be at excess risk and not a good candidate. He underwent multivessel PCI 112/31/2013 including a drug-eluting stent to the body of the left main, PTCA and restenting of the saphenous vein graft to the ramus branch, and drug-eluting stent implantation in the proximal right coronary. The patient tolerated the procedure without complications. Overnight and on the morning following the procedure the  patient had no recurrence of angina and was felt eligible for discharge.  The patient is instructed to followup with Dr. Viann Fish within 7 days.   Discharge Exam: Blood pressure 125/59, pulse 58, temperature 97.5 F (36.4 C), temperature source Oral, resp. rate 15, height 5\' 7"  (1.702 m), weight 77.3 kg (170 lb 6.7 oz), SpO2 97.00%.   Right groin is unremarkable.  Neurological exam is unremarkable.  Chest is clear.  Cardiac exam is normal with no gallop murmur or rub Labs:   Lab Results  Component Value Date   WBC 5.6 07/04/2012   HGB 12.4* 07/04/2012   HCT 35.8* 07/04/2012   MCV 89.3 07/04/2012   PLT 83* 07/04/2012    Lab 07/04/12 0610  NA 141  K 4.0  CL 107  CO2 25  BUN 12  CREATININE 1.30  CALCIUM 8.7  PROT --  BILITOT --  ALKPHOS --  ALT --  AST --  GLUCOSE 100*   Lab Results  Component Value Date   TROPONINI <0.30 2012-07-23      Radiology: No evidence of heart failure  EKG: Normal sinus rhythm with right bundle branch block  FOLLOW UP PLANS AND APPOINTMENTS Discharge Orders    Future Orders Please Complete By Expires   Amb Referral to Cardiac Rehabilitation      Comments:   Pt agrees to Outpt. CRP in [facility], will send referral.       Medication List     As of 07/04/2012 10:33 AM    STOP taking these medications         aspirin 81 MG chewable tablet      ranolazine 1000 MG SR tablet   Commonly known as: RANEXA      TAKE  these medications         amLODipine 5 MG tablet   Commonly known as: NORVASC   Take 5 mg by mouth daily.      aspirin 325 MG EC tablet   Take 1 tablet (325 mg total) by mouth daily.      bimatoprost 0.03 % ophthalmic solution   Commonly known as: LUMIGAN   Place 1 drop into both eyes 2 (two) times daily.      clopidogrel 75 MG tablet   Commonly known as: PLAVIX   Take 75 mg by mouth daily.      fish oil-omega-3 fatty acids 1000 MG capsule   Take 1 g by mouth 2 (two) times daily.      GARLIC PO   Take 1  tablet by mouth daily.      isosorbide mononitrate 120 MG 24 hr tablet   Commonly known as: IMDUR   Take 120 mg by mouth daily.      ISTALOL 0.5 % (DAILY) Soln   Generic drug: Timolol Maleate   Apply 1 drop to eye daily.      metoprolol 50 MG tablet   Commonly known as: LOPRESSOR   Take 75 mg by mouth 2 (two) times daily.      nitroGLYCERIN 0.4 MG SL tablet   Commonly known as: NITROSTAT   Place 0.4 mg under the tongue every 5 (five) minutes as needed. Chest pain      VITAMIN D (CHOLECALCIFEROL) PO   Take 1 tablet by mouth daily.           Follow-up Information    Follow up with [facility]. (Please arrange an appointment to see Dr. Donnie Aho in 7-10 days. May alsoall Dr. Katrinka Blazing if questions or)    Contact information:   [address] [place] [address] [phone]         BRING ALL MEDICATIONS WITH YOU TO FOLLOW UP APPOINTMENTS  Time spent with patient to include physician time: 30 minutes  Signed: Lesleigh Noe 07/04/2012, 10:33 AM

## 2012-07-05 MED FILL — Dextrose Inj 5%: INTRAVENOUS | Qty: 50 | Status: AC

## 2012-12-20 ENCOUNTER — Other Ambulatory Visit: Payer: Self-pay | Admitting: Dermatology

## 2013-02-26 ENCOUNTER — Other Ambulatory Visit: Payer: Self-pay | Admitting: Cardiology

## 2013-02-26 ENCOUNTER — Ambulatory Visit
Admission: RE | Admit: 2013-02-26 | Discharge: 2013-02-26 | Disposition: A | Discharge status: Home / Self Care | Payer: Medicare Other | Source: Ambulatory Visit | Attending: Cardiology | Admitting: Cardiology

## 2013-02-26 DIAGNOSIS — R0789 Other chest pain: Secondary | ICD-10-CM

## 2013-02-27 ENCOUNTER — Other Ambulatory Visit: Payer: Self-pay | Admitting: Cardiology

## 2013-02-27 ENCOUNTER — Encounter (HOSPITAL_COMMUNITY): Payer: Self-pay | Admitting: Pharmacy Technician

## 2013-02-27 NOTE — H&P (Signed)
 Mangino, R Nolan    Date of visit:  02/26/2013 DOB:  06/26/1927    Age:  79 yrs. Medical record number:  [id number]     Account number:  [id number] Primary Care Provider: RUSSO, JOHN M ____________________________ CURRENT DIAGNOSES  1. CAD. Bypass graft  2. Stent Placement (drug eluting)  3. Cad,native  4. Chest pain, other  5. Hyperlipidemia  6. Angina Pectoris  7. 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 Personal History Of Malignant Neoplasm Of Prostate  9. Surgery-Aortocoronary Bypass Grafting ____________________________ ALLERGIES  Ezetimibe, Intolerance-unknown  Fenofibrate, Intolerance-unknown  Gemfibrozil, Intolerance-unknown  Red Yeast Rice, Intolerance-unknown ____________________________ MEDICATIONS  1. Lumigan 0.03 % Drops, BID  2. garlic 1 mg capsule, 1500 mg qd  3. Vitamin D 1,000 unit Tablet, 1 p.o. daily  4. herbal drugs tablet, mega red  1 qd  5. Istalol 0.5 % Drops, Once Daily, 1 gtt ou qam  6. isosorbide mononitrate 120 mg tablet extended release 24 hr, 1 p.o. daily  7. Vitamin D3 1,000 unit tablet, 1 p.o. daily  8. amlodipine 5 mg tablet, 1 p.o. daily  9. metoprolol tartrate 50 mg tablet, 1-1/2 p.o. b.i.d.  10. nitroglycerin 0.4 mg tablet, sublingual, prn chest pain  11. clopidogrel 75 mg tablet, 1 p.o. daily  12. pantoprazole 40 mg tablet,delayed release (DR/EC), 1 p.o. daily  13. alfuzosin 10 mg tablet extended release 24 hr, QHS  14. finasteride 5 mg tablet, 1 p.o. daily  15. iron 159 mg (45 mg iron) tablet extended release, 1 p.o. daily  16. aspirin 325 mg tablet, 1 p.o. daily ____________________________ CHIEF COMPLAINTS  Chest pain with activity ____________________________ HISTORY OF PRESENT ILLNESS  Patient was seen early for evaluation of chest discomfort. The patient comes in today with somewhat vague complaints. He complained his legs become weak when he exercises. Evidently Friday evening he was playing  cards with his family and developed shaking chills and felt as if he could not get warm. He then had a severe episode of angina and required 3 nitroglycerin. He has not had any recurrence of those symptoms. He complains that he gets angina with any amount of cold weather and has never had it in the summer when coming back into air conditioning. It is very difficult to tell whether he is having worsening angina by talking to him. He comes in today states that he is having increasing frequency and severity of angina and has taken nitroglycerin earlier and more frequently. He had 3 drug-eluting stents placed in December to the left main, proximal right coronary artery, and diagonal vein graft by Dr. Smith. He is continuing to take his medications. He denies PND, orthopnea or edema. ____________________________ PAST HISTORY  Past Medical Illnesses:  prostate cancer treated with cryoablation, hyperlipidemia, history of villous adenoma;  Cardiovascular Illnesses:  CAD;  Surgical Procedures:  CABG w/LIMA and SVGs to Diag 1 PDA PLR March 2000, Dr. Gerhardt, inguinal herniorrhaphy-rt, nasal septoplasty, Redo CABG w SVG to OM and SVG to PDA-PL 07/2005 Dr. Gerhardt, colectomy-partial June 2007  for villous adenoma of the cecum;  NYHA Classification:  II;  Cardiology Procedures-Invasive:  PTCA of the RCA November 1995, Cypher Stent of SVG to RCA 9/05, Promus stent October 2011 to SVG OM, Xience stent to left main, prox RCA and SVG to the OM 07/03/12 Dr. Smith;  Cardiology Procedures-Noninvasive:  adenosine cardiolite February 2002, treadmill April 2007;  Cardiac Cath Results:  90% Left main, 90% stenosis proximal LAD,   occluded mid LAD, small and nondominant CFX, 90% prox RCA prior to AM, then occluded, widely patent LAD LIMA graft, SVG to RCA occluded, SVG to OM 95% ISR of graft;  Peripheral Vascular Procedures:  carotid doppler January 2007;  LVEF of 60% documented via cardiac cath on 04/07/2011,    ____________________________ CARDIO-PULMONARY TEST DATES EKG Date:  02/26/2013;   Cardiac Cath Date:  06/29/2012;  CABG: 07/25/2005;  Stent Placement Date: 07/03/2012;  Nuclear Study Date:  08/05/1998;  Chest Xray Date: 06/26/2012;   ____________________________ FAMILY HISTORY Brother -- CVA, Deceased Father -- Cancer, Deceased Mother -- Malignant neoplasm of lung, Deceased Sister -- Sister alive and well ____________________________ SOCIAL HISTORY Alcohol Use:  drinks occasionally;  Smoking:  used to smoke but quit Prior to 1980;  Diet:  fat modified diet;  Lifestyle:  married;  Exercise:  some exercise;  Occupation:  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 Weight:  182.00 lbs. Height:  68"BMI: 27  Constitutional:  pleasant white male in no acute distress Skin:  scattered hemangiomas Head:  normocephalic, normal hair pattern, no masses or tenderness ENT:  ears, nose and throat reveal no gross abnormalities.  Dentition good. Neck:  supple, without massess. No JVD, thyromegaly or carotid bruits. Carotid upstroke normal. Chest:  normal symmetry, clear to auscultation and percussion., healed median sternotomy scar Cardiac:  regular rhythm, normal S1 and S2, No S3 or S4, no murmurs, gallops or rubs detected. Peripheral Pulses:  femoral pulses 2+, posterior tibial pulses 2+, no bruits auscultated Extremities & Back:  well healed saphenous vein donor site RLE, well healed saphenous vein donor site LLE, no edema present Neurological:  no gross motor or sensory deficits noted, affect appropriate, oriented x3. ____________________________ MOST RECENT LIPID PANEL 10/12/12  CHOL TOTL 185 mg/dl, LDL 95 calc, HDL 24 mg/dl, TRIGLYCER 332 mg/dl and CHOL/HDL 7.8  (Calc) ____________________________ IMPRESSIONS/PLAN  1. Worsening angina-class III despite 3 drug medical therapy 2. Coronary artery disease with previous bypass grafting x2 and previous bypass graft disease 3. Hyperlipidemia with previous statin intolerance  Recommendations:  His history is somewhat vague but he is having worsening symptoms and wants to know what is going on. We have talked with him extensively in the past about the fact that he will continue to have exertional angina which he has had since his previous interventional procedure due to incomplete revascularization the right coronary artery.  Cardiac catheterization was discussed with the patient including risks of myocardial infarction, death, stroke, bleeding, arrhythmia, dye allergy, or renal insufficiency. He understands and is willing to proceed. Possibility of percutaneous intervention at the same setting was also discussed with the patient including risks. EKG shows RBBB no ischemic ST changes. ____________________________ TODAYS ORDERS  1. Chest X-ray PA/Lat: today  2. Comprehensive Metabolic Panel: Today  3. Complete Blood Count: Today  4. Draw PT/INR: Today  5. PTT: Today  6. 12 Lead EKG: Today                       ____________________________ Cardiology Physician:  W. Spencer Deone Omahoney, Jr. MD F[facility]     

## 2013-02-28 ENCOUNTER — Encounter (HOSPITAL_COMMUNITY)
Admission: RE | Disposition: A | Discharge status: Home / Self Care | Payer: Self-pay | Source: Ambulatory Visit | Attending: Cardiology

## 2013-02-28 ENCOUNTER — Ambulatory Visit (HOSPITAL_COMMUNITY)
Admission: RE | Admit: 2013-02-28 | Discharge: 2013-02-28 | Disposition: A | Discharge status: Home / Self Care | Payer: Medicare Other | Source: Ambulatory Visit | Attending: Cardiology | Admitting: Cardiology

## 2013-02-28 DIAGNOSIS — Z87891 Personal history of nicotine dependence: Secondary | ICD-10-CM | POA: Insufficient documentation

## 2013-02-28 DIAGNOSIS — Z8546 Personal history of malignant neoplasm of prostate: Secondary | ICD-10-CM | POA: Insufficient documentation

## 2013-02-28 DIAGNOSIS — I209 Angina pectoris, unspecified: Secondary | ICD-10-CM | POA: Insufficient documentation

## 2013-02-28 DIAGNOSIS — Z79899 Other long term (current) drug therapy: Secondary | ICD-10-CM | POA: Insufficient documentation

## 2013-02-28 DIAGNOSIS — I2510 Atherosclerotic heart disease of native coronary artery without angina pectoris: Secondary | ICD-10-CM | POA: Insufficient documentation

## 2013-02-28 DIAGNOSIS — Z7982 Long term (current) use of aspirin: Secondary | ICD-10-CM | POA: Insufficient documentation

## 2013-02-28 DIAGNOSIS — I25810 Atherosclerosis of coronary artery bypass graft(s) without angina pectoris: Secondary | ICD-10-CM | POA: Insufficient documentation

## 2013-02-28 DIAGNOSIS — Z888 Allergy status to other drugs, medicaments and biological substances status: Secondary | ICD-10-CM | POA: Insufficient documentation

## 2013-02-28 DIAGNOSIS — E785 Hyperlipidemia, unspecified: Secondary | ICD-10-CM | POA: Insufficient documentation

## 2013-02-28 DIAGNOSIS — Z9861 Coronary angioplasty status: Secondary | ICD-10-CM | POA: Insufficient documentation

## 2013-02-28 DIAGNOSIS — I2582 Chronic total occlusion of coronary artery: Secondary | ICD-10-CM | POA: Insufficient documentation

## 2013-02-28 DIAGNOSIS — Z85048 Personal history of other malignant neoplasm of rectum, rectosigmoid junction, and anus: Secondary | ICD-10-CM | POA: Insufficient documentation

## 2013-02-28 HISTORY — PX: LEFT HEART CATHETERIZATION WITH CORONARY/GRAFT ANGIOGRAM: SHX5450

## 2013-02-28 LAB — BASIC METABOLIC PANEL
BUN: 13 mg/dL (ref 6–23)
Chloride: 111 mEq/L (ref 96–112)
Glucose, Bld: 95 mg/dL (ref 70–99)
Potassium: 3.8 mEq/L (ref 3.5–5.1)
Sodium: 142 mEq/L (ref 135–145)

## 2013-02-28 LAB — CBC
HCT: 30 % — ABNORMAL LOW (ref 39.0–52.0)
Hemoglobin: 9.9 g/dL — ABNORMAL LOW (ref 13.0–17.0)
RBC: 3.64 MIL/uL — ABNORMAL LOW (ref 4.22–5.81)
WBC: 4 10*3/uL (ref 4.0–10.5)

## 2013-02-28 LAB — PROTIME-INR: INR: 1.38 (ref 0.00–1.49)

## 2013-02-28 SURGERY — LEFT HEART CATHETERIZATION WITH CORONARY/GRAFT ANGIOGRAM
Anesthesia: LOCAL

## 2013-02-28 MED ORDER — SODIUM CHLORIDE 0.9 % IV SOLN: 1.0000 mL/kg/h | INTRAVENOUS | Status: DC

## 2013-02-28 MED ORDER — LIDOCAINE HCL (PF) 1 % IJ SOLN
INTRAMUSCULAR | Status: AC
  Filled 2013-02-28: qty 30

## 2013-02-28 MED ORDER — HEPARIN (PORCINE) IN NACL 2-0.9 UNIT/ML-% IJ SOLN
INTRAMUSCULAR | Status: AC
  Filled 2013-02-28: qty 1000

## 2013-02-28 MED ORDER — ASPIRIN 81 MG PO CHEW
324.0000 mg | CHEWABLE_TABLET | ORAL | Status: AC
  Administered 2013-02-28: 324 mg via ORAL

## 2013-02-28 MED ORDER — ASPIRIN 81 MG PO CHEW
CHEWABLE_TABLET | ORAL | Status: AC
  Filled 2013-02-28: qty 4

## 2013-02-28 MED ORDER — DIAZEPAM 5 MG PO TABS
ORAL_TABLET | ORAL | Status: AC
  Filled 2013-02-28: qty 1

## 2013-02-28 MED ORDER — NITROGLYCERIN 0.2 MG/ML ON CALL CATH LAB
INTRAVENOUS | Status: AC
  Filled 2013-02-28: qty 1

## 2013-02-28 MED ORDER — SODIUM CHLORIDE 0.9 % IV SOLN
INTRAVENOUS | Status: DC
  Administered 2013-02-28: 06:00:00 via INTRAVENOUS

## 2013-02-28 MED ORDER — ACETAMINOPHEN 325 MG PO TABS: 650.0000 mg | ORAL_TABLET | ORAL | Status: DC | PRN

## 2013-02-28 MED ORDER — DIAZEPAM 5 MG PO TABS
5.0000 mg | ORAL_TABLET | ORAL | Status: AC
  Administered 2013-02-28: 5 mg via ORAL

## 2013-02-28 NOTE — CV Procedure (Signed)
CARDIAC CATHETERIZATION REPORT   [name]  DOB/Age:  77/25/1928  79 y.o. male  MRN: [id number]  Today's date: 02/28/2013   PROCEDURE:  Left heart catheterization with selective coronary angiography, bypass graft angiograms, and left ventriculogram.  INDICATIONS:  Class 3-4 angina in a patient with previous redo bypass grafting and previous coronary stenting  The risks, benefits, and details of the procedure were explained to the patient.  The patient verbalized understanding and wanted to proceed.  Informed written consent was obtained.  PROCEDURE TECHNIQUE:  After Xylocaine anesthesia a 45F sheath was placed in the right femoral artery with a single anterior needle wall stick.   Left coronary angiography was done using a Judkins L4 guide catheter.  Right coronary angiography was done using a Judkins R4 guide catheter. The diagonal graft was selected with an Amplatz left 1 catheter. The LIMA was selected with an internal mammary catheter.  Left ventriculography was done using a pigtail catheter. The films were reviewed with Dr. Verdis Prime who done the previous interventions and it was felt that he could have no further percutaneous interventions The patient tolerated the procedure well and was returned to the holding area for sheath removal.   CONTRAST:   100 cc.  COMPLICATIONS:  None.    HEMODYNAMICS:  Aortic postcontrast 07/27/1954 LV postcontrast 124/5-13  There was no gradient between the left ventricle and aorta.    ANGIOGRAPHIC DATA:    CORONARY ARTERIES:   Arise and distribute normally. Right dominant. Dense coronary calcification is noted.  Left main coronary artery: The previously placed drug-eluting stent is widely patent. The vessel is heavily calcified.  Left anterior descending: Severe eccentric stenosis prior to septal perforator branch and is then totally occluded. A diagonal branch arises at a right angle off of this prior to  septal perforator and has a segmental 70-80% stenosis. The previous insertion site of the vein graft  has retrograde flow and is then occluded. There is collateral filling of the distal right coronary circulation from the left coronary artery.  Circumflex coronary artery: Supplies a small marginal branch and is patent  Right coronary artery: Stent in the proximal vessel is widely patent with mild irregularity proximal to the stent. There is a stenosis at the acute marginal branch of the vessel is then totally occluded and fills by collaterals through the left coronary system..  Saphenous vein graft to the diagonal branch is occluded in its midportion with evidence of thrombus. There is no antegrade flow.  Internal mammary graft to LAD is widely patent.  LEFT VENTRICULOGRAM:  Performed in the 30 RAO projection.  The aortic and mitral valves are normal. The left ventricle is normal in size with normal wall motion. Estimated ejection fraction is 60%.  IMPRESSIONS:  1. Severe native vessel coronary artery disease with a patent left main stent, severe proximal LAD stenosis following the LAD stent, severe disease involving diagonal branch which is now in revascularize and occlusion of the right coronary artery 2. Occlusion intervally of the vein graft to the diagonal branch 3. Potential sites of ischemia of the distal right coronary circulation, diagonal circulation which is a moderate size branch 4. Normal left ventricle.[name]  RECOMMENDATION:  Continued medical therapy. The patient is not a candidate for further interventional therapy according to Dr. Katrinka Blazing.   [name] MD [facility]

## 2013-02-28 NOTE — Progress Notes (Signed)
Stable post cath.  He has occluded the SVG to diagonal.  I reviewed the films with Dr. Katrinka Blazing who feels that he is not a candidate for PCI of this vessel.  Angina has worsened recently.  I would like him to get involved in cardiac rehab.  At this point only options with patent stent to LM is medical therapy or a third time redo CABG.   [name] MD [facility]

## 2013-02-28 NOTE — Interval H&P Note (Signed)
History and Physical Interval Note:  02/28/2013 7:49 AM  [name]  has presented today for surgery, with the diagnosis of Chest pain  The various methods of treatment have been discussed with the patient and family. After consideration of risks, benefits and other options for treatment, the patient has consented to  Procedure(s): LEFT HEART CATHETERIZATION WITH CORONARY/GRAFT ANGIOGRAM (N/A) as a surgical intervention .  The patient's history has been reviewed, patient examined, no change in status, stable for surgery.  I have reviewed the patient's chart and labs.  Questions were answered to the patient's satisfaction.     TILLEY JR,W [facility]

## 2013-02-28 NOTE — H&P (View-Only) (Signed)
[name]    Date of visit:  02/26/2013 DOB:  05-Apr-1927    Age:  79 yrs. Medical record number:  [id number]     Account number:  [id number] Primary Care Provider: Gwen Pounds ____________________________ CURRENT DIAGNOSES  1. CAD. Bypass graft  2. Stent Placement (drug eluting)  3. Cad,native  4. Chest pain, other  5. Hyperlipidemia  6. Angina Pectoris  7. 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 Personal History Of Malignant Neoplasm Of Prostate  9. Surgery-Aortocoronary Bypass Grafting ____________________________ ALLERGIES  Ezetimibe, Intolerance-unknown  Fenofibrate, Intolerance-unknown  Gemfibrozil, Intolerance-unknown  Red Yeast Rice, Intolerance-unknown ____________________________ MEDICATIONS  1. Lumigan 0.03 % Drops, BID  2. garlic 1 mg capsule, 1500 mg qd  3. Vitamin D 1,000 unit Tablet, 1 p.o. daily  4. herbal drugs tablet, mega red  1 qd  5. Istalol 0.5 % Drops, Once Daily, 1 gtt ou qam  6. isosorbide mononitrate 120 mg tablet extended release 24 hr, 1 p.o. daily  7. Vitamin D3 1,000 unit tablet, 1 p.o. daily  8. amlodipine 5 mg tablet, 1 p.o. daily  9. metoprolol tartrate 50 mg tablet, 1-1/2 p.o. b.i.d.  10. nitroglycerin 0.4 mg tablet, sublingual, prn chest pain  11. clopidogrel 75 mg tablet, 1 p.o. daily  12. pantoprazole 40 mg tablet,delayed release (DR/EC), 1 p.o. daily  13. alfuzosin 10 mg tablet extended release 24 hr, QHS  14. finasteride 5 mg tablet, 1 p.o. daily  15. iron 159 mg (45 mg iron) tablet extended release, 1 p.o. daily  16. aspirin 325 mg tablet, 1 p.o. daily ____________________________ CHIEF COMPLAINTS  Chest pain with activity ____________________________ HISTORY OF PRESENT ILLNESS  Patient was seen early for evaluation of chest discomfort. The patient comes in today with somewhat vague complaints. He complained his legs become weak when he exercises. Evidently Friday evening he was playing  cards with his family and developed shaking chills and felt as if he could not get warm. He then had a severe episode of angina and required 3 nitroglycerin. He has not had any recurrence of those symptoms. He complains that he gets angina with any amount of cold weather and has never had it in the summer when coming back into air conditioning. It is very difficult to tell whether he is having worsening angina by talking to him. He comes in today states that he is having increasing frequency and severity of angina and has taken nitroglycerin earlier and more frequently. He had 3 drug-eluting stents placed in December to the left main, proximal right coronary artery, and diagonal vein graft by Dr. Katrinka Blazing. He is continuing to take his medications. He denies PND, orthopnea or edema. ____________________________ PAST HISTORY  Past Medical Illnesses:  prostate cancer treated with cryoablation, hyperlipidemia, history of villous adenoma;  Cardiovascular Illnesses:  CAD;  Surgical Procedures:  CABG w/LIMA and SVGs to Diag 1 PDA PLR March 2000, Dr. Tyrone Sage, inguinal herniorrhaphy-rt, nasal septoplasty, Redo CABG w SVG to OM and SVG to PDA-PL 07/2005 Dr. Tyrone Sage, colectomy-partial June 2007  for villous adenoma of the cecum;  NYHA Classification:  II;  Cardiology Procedures-Invasive:  PTCA of the RCA November 1995, Cypher Stent of SVG to RCA 9/05, Promus stent October 2011 to SVG OM, Xience stent to left main, prox RCA and SVG to the OM 07/03/12 Dr. Katrinka Blazing;  Cardiology Procedures-Noninvasive:  adenosine cardiolite February 2002, treadmill April 2007;  Cardiac Cath Results:  90% Left main, 90% stenosis proximal LAD,  occluded mid LAD, small and nondominant CFX, 90% prox RCA prior to AM, then occluded, widely patent LAD LIMA graft, SVG to RCA occluded, SVG to OM 95% ISR of graft;  Peripheral Vascular Procedures:  carotid doppler January 2007;  LVEF of 60% documented via cardiac cath on 04/07/2011,    ____________________________ CARDIO-PULMONARY TEST DATES EKG Date:  02/26/2013;   Cardiac Cath Date:  06/29/2012;  CABG: 07/25/2005;  Stent Placement Date: 07/03/2012;  Nuclear Study Date:  08/05/1998;  Chest Xray Date: 06/26/2012;   ____________________________ FAMILY HISTORY Brother -- CVA, Deceased Father -- Cancer, Deceased Mother -- Malignant neoplasm of lung, Deceased Sister -- Sister alive and well ____________________________ SOCIAL HISTORY Alcohol Use:  drinks occasionally;  Smoking:  used to smoke but quit Prior to 1980;  Diet:  fat modified diet;  Lifestyle:  married;  Exercise:  some exercise;  Occupation:  retired and worked in Nurse, children's;  Residence:  lives with wife;   ____________________________ PHYSICAL EXAMINATION VITAL SIGNS  Blood Pressure:  94/60 Sitting, Right arm, regular cuff   Pulse:  62/min. Weight:  182.00 lbs. Height:  68"BMI: 27  Constitutional:  pleasant white male in no acute distress Skin:  scattered hemangiomas Head:  normocephalic, normal hair pattern, no masses or tenderness ENT:  ears, nose and throat reveal no gross abnormalities.  Dentition good. Neck:  supple, without massess. No JVD, thyromegaly or carotid bruits. Carotid upstroke normal. Chest:  normal symmetry, clear to auscultation and percussion., healed median sternotomy scar Cardiac:  regular rhythm, normal S1 and S2, No S3 or S4, no murmurs, gallops or rubs detected. Peripheral Pulses:  femoral pulses 2+, posterior tibial pulses 2+, no bruits auscultated Extremities & Back:  well healed saphenous vein donor site RLE, well healed saphenous vein donor site LLE, no edema present Neurological:  no gross motor or sensory deficits noted, affect appropriate, oriented x3. ____________________________ MOST RECENT LIPID PANEL 10/12/12  CHOL TOTL 185 mg/dl, LDL 95 calc, HDL 24 mg/dl, TRIGLYCER 161 mg/dl and CHOL/HDL 7.8  (Calc) ____________________________ IMPRESSIONS/PLAN  1. Worsening angina-class III despite 3 drug medical therapy 2. Coronary artery disease with previous bypass grafting x2 and previous bypass graft disease 3. Hyperlipidemia with previous statin intolerance  Recommendations:  His history is somewhat vague but he is having worsening symptoms and wants to know what is going on. We have talked with him extensively in the past about the fact that he will continue to have exertional angina which he has had since his previous interventional procedure due to incomplete revascularization the right coronary artery.  Cardiac catheterization was discussed with the patient including risks of myocardial infarction, death, stroke, bleeding, arrhythmia, dye allergy, or renal insufficiency. He understands and is willing to proceed. Possibility of percutaneous intervention at the same setting was also discussed with the patient including risks. EKG shows RBBB no ischemic ST changes. ____________________________ TODAYS ORDERS  1. Chest X-ray PA/Lat: today  2. Comprehensive Metabolic Panel: Today  3. Complete Blood Count: Today  4. Draw PT/INR: Today  5. PTT: Today  6. 12 Lead EKG: Today                       ____________________________ Cardiology Physician:  [name] MD [facility]

## 2013-03-08 ENCOUNTER — Encounter: Payer: Self-pay | Admitting: Cardiology

## 2013-03-08 NOTE — Progress Notes (Unsigned)
Patient ID: [name], male   DOB: [birth year], 79 y.o.   MRN: [id number]   [name]    Date of visit:  03/08/2013 DOB:  August 28, 1926    Age:  79 yrs. Medical record number:  [id number]     Account number:  [id number] Primary Care Provider: Gwen Pounds ____________________________ CURRENT DIAGNOSES  1. CAD. Bypass graft  2. Stent Placement (drug eluting)  3. Cad,native  4. Chest pain, other  5. Hyperlipidemia  6. Angina Pectoris  7. 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 Personal History Of Malignant Neoplasm Of Prostate  9. Surgery-Aortocoronary Bypass Grafting ____________________________ ALLERGIES  Ezetimibe, Intolerance-unknown  Fenofibrate, Intolerance-unknown  Gemfibrozil, Intolerance-unknown  Red Yeast Rice, Intolerance-unknown ____________________________ MEDICATIONS  1. Lumigan 0.03 % Drops, BID  2. garlic 1 mg capsule, 1500 mg qd  3. Vitamin D 1,000 unit Tablet, 1 p.o. daily  4. herbal drugs tablet, mega red  1 qd  5. Istalol 0.5 % Drops, Once Daily, 1 gtt ou qam  6. isosorbide mononitrate 120 mg tablet extended release 24 hr, 1 p.o. daily  7. Vitamin D3 1,000 unit tablet, 1 p.o. daily  8. amlodipine 5 mg tablet, 1 p.o. daily  9. metoprolol tartrate 50 mg tablet, 1-1/2 p.o. b.i.d.  10. nitroglycerin 0.4 mg tablet, sublingual, prn chest pain  11. clopidogrel 75 mg tablet, 1 p.o. daily  12. pantoprazole 40 mg tablet,delayed release (DR/EC), 1 p.o. daily  13. alfuzosin 10 mg tablet extended release 24 hr, QHS  14. finasteride 5 mg tablet, 1 p.o. daily  15. iron 159 mg (45 mg iron) tablet extended release, 1 p.o. daily  16. aspirin 325 mg tablet, 1 p.o. daily ____________________________ CHIEF COMPLAINTS  Followup of Cad,native ____________________________ HISTORY OF PRESENT ILLNESS Patient seen for cardiac followup. He was hospitalized with a change in his anginal symptoms and was found to have occluded the vein graft  to the diagonal or obtuse marginal artery. He continues to have some anginal pains with variable exertion. He denies rest angina and has no PND, orthopnea or edema. His catheterization site has healed and well. His previous left mainstem and right coronary stent were widely patent. He complains of numbness in his legs when he stands up or sometimes walks.____________________________ PAST HISTORY  Past Medical Illnesses:  prostate cancer treated with cryoablation, hyperlipidemia, history of villous adenoma;  Cardiovascular Illnesses:  CAD;  Surgical Procedures:  CABG w/LIMA and SVGs to Diag 1 PDA PLR March 2000, Dr. Tyrone Sage, inguinal herniorrhaphy-rt, nasal septoplasty, Redo CABG w SVG to OM and SVG to PDA-PL 07/2005 Dr. Tyrone Sage, colectomy-partial June 2007  for villous adenoma of the cecum;  NYHA Classification:  II;  Cardiology Procedures-Invasive:  PTCA of the RCA November 1995, Cypher Stent of SVG to RCA 9/05, Promus stent October 2011 to SVG OM, Xience stent to left main, prox RCA and SVG to the OM 07/03/12 Dr. Katrinka Blazing, cardiac cath (left) August 2014;  Cardiology Procedures-Noninvasive:  adenosine cardiolite February 2002, treadmill April 2007;  Cardiac Cath Results: Left main stent patent 90% stenosis proximal LAD, occluded mid LAD, small and nondominant CFX,Widely patent RCA stent prior to AM, then occluded, widely patent LAD LIMA graft, SVG to RCA occluded, SVG to OM occluded  Peripheral Vascular Procedures:  carotid doppler January 2007;  LVEF of 60% documented via cardiac cath on 03/01/2013,   ____________________________ CARDIO-PULMONARY TEST DATES EKG Date:  02/26/2013;   Cardiac Cath Date:  02/28/2013;  CABG: 07/25/2005;  Stent  Placement Date: 07/03/2012;  Nuclear Study Date:  08/05/1998;  Chest Xray Date: 06/26/2012;   ____________________________ SOCIAL HISTORY Alcohol Use:  drinks occasionally;  Smoking:  used to smoke but quit Prior to 1980;  Diet:  fat modified diet;  Lifestyle:  married;   Exercise:  some exercise;  Occupation:  retired and worked in Nurse, children's;  Residence:  lives with wife;   ____________________________ REVIEW OF SYSTEMS General:  hot flashes Eyes: wears eye glasses/contact lenses, glaucoma Ears, Nose, Throat, Mouth:  partial hearing loss left ear, hearing aid left ear Respiratory: denies dyspnea, cough, wheezing or hemoptysis. Cardiovascular:  please review HPI  Genitourinary-Male: prostatism, erectile dysfunction  Musculoskeletal:  numbness of legs when stands, chronic low back pain  ____________________________ PHYSICAL EXAMINATION VITAL SIGNS  Blood Pressure:  110/60 Sitting, Right arm, regular cuff   Pulse:  64/min. Weight:  183.00 lbs. Height:  68"BMI: 28  Constitutional:  pleasant white male in no acute distress Skin:  scattered hemangiomas Head:  normocephalic, normal hair pattern, no masses or tenderness ENT:  ears, nose and throat reveal no gross abnormalities.  Dentition good. Neck:  supple, without massess. No JVD, thyromegaly or carotid bruits. Carotid upstroke normal. Chest:  normal symmetry, clear to auscultation and percussion., healed median sternotomy scar Cardiac:  regular rhythm, normal S1 and S2, No S3 or S4, no murmurs, gallops or rubs detected. Peripheral Pulses:  femoral pulses 2+, posterior tibial pulses 2+, no bruits auscultated Extremities & Back:  well healed saphenous vein donor site RLE, well healed saphenous vein donor site LLE, no edema present, cath site clean and dry Neurological:  no gross motor or sensory deficits noted, affect appropriate, oriented x3. ____________________________ MOST RECENT LIPID PANEL 10/12/12  CHOL TOTL 185 mg/dl, LDL 95 calc, HDL 24 mg/dl, TRIGLYCER 119 mg/dl and CHOL/HDL 7.8 (Calc) ____________________________ IMPRESSIONS/PLAN 1. Worsening angina due to vein graft occlusion of the diagonal branch. He now has on revascularized right coronary artery,  circumflex marginal system and diagonal branch which are filled by collaterals. 2. Hyperlipidemia with statin intolerance 3. Coronary artery disease bypass graft  Recommendations:  At this point he has no patent saphenous vein grafts but does have patent stent sites in the left main and right coronary artery. He and his wife have some limited understanding of why he continues to have angina but I have repeatedly stressed to him that he has inadequate collaterals and that is why he has angina. The change in his angina is likely due to the recent occlusion of the vein graft to the diagonal.  At this point the management of this is either medically or consideration of a third time redo bypass grafting. He is currently involved in an outpatient exercise program and I suggested that he consider a cardiac rehabilitation program at the hospital closest to his home. He is on fairly good medical therapy at this time. We have previously tried him on Ranexa but he did not tolerate this. I will see him in followup in 3 months.  if the numbness in his legs persists he may need to be evaluated for spinal stenosis. ____________________________ TODAYS ORDERS  1. Return Visit: 3 months                       ____________________________ Cardiology Physician:  [name] MD [facility]

## 2013-12-24 ENCOUNTER — Other Ambulatory Visit: Payer: Self-pay | Admitting: Specialist

## 2013-12-24 DIAGNOSIS — M48061 Spinal stenosis, lumbar region without neurogenic claudication: Secondary | ICD-10-CM

## 2013-12-24 DIAGNOSIS — M5416 Radiculopathy, lumbar region: Secondary | ICD-10-CM

## 2014-01-01 ENCOUNTER — Ambulatory Visit
Admission: RE | Admit: 2014-01-01 | Discharge: 2014-01-01 | Disposition: A | Discharge status: Home / Self Care | Payer: Self-pay | Source: Ambulatory Visit | Attending: Specialist | Admitting: Specialist

## 2014-01-01 ENCOUNTER — Inpatient Hospital Stay
Admission: RE | Admit: 2014-01-01 | Discharge: 2014-01-01 | Disposition: A | Discharge status: Home / Self Care | Payer: Self-pay | Source: Ambulatory Visit | Attending: Specialist | Admitting: Specialist

## 2014-01-01 ENCOUNTER — Ambulatory Visit
Admission: RE | Admit: 2014-01-01 | Discharge: 2014-01-01 | Disposition: A | Discharge status: Home / Self Care | Payer: Medicare Other | Source: Ambulatory Visit | Attending: Specialist | Admitting: Specialist

## 2014-01-01 ENCOUNTER — Other Ambulatory Visit: Payer: Self-pay | Admitting: Specialist

## 2014-01-01 VITALS — BP 139/68 | HR 54

## 2014-01-01 DIAGNOSIS — M5416 Radiculopathy, lumbar region: Secondary | ICD-10-CM

## 2014-01-01 DIAGNOSIS — M48061 Spinal stenosis, lumbar region without neurogenic claudication: Secondary | ICD-10-CM

## 2014-01-01 DIAGNOSIS — IMO0002 Reserved for concepts with insufficient information to code with codable children: Secondary | ICD-10-CM

## 2014-01-01 DIAGNOSIS — R52 Pain, unspecified: Secondary | ICD-10-CM

## 2014-01-01 MED ORDER — DIAZEPAM 5 MG PO TABS: 5.0000 mg | ORAL_TABLET | Freq: Once | ORAL | Status: DC

## 2014-01-01 MED ORDER — MEPERIDINE HCL 100 MG/ML IJ SOLN
50.0000 mg | Freq: Once | INTRAMUSCULAR | Status: AC
  Administered 2014-01-01: 50 mg via INTRAMUSCULAR

## 2014-01-01 MED ORDER — IOHEXOL 180 MG/ML  SOLN
15.0000 mL | Freq: Once | INTRAMUSCULAR | Status: AC | PRN
  Administered 2014-01-01: 15 mL via INTRATHECAL

## 2014-01-01 MED ORDER — ONDANSETRON HCL 4 MG/2ML IJ SOLN
4.0000 mg | Freq: Once | INTRAMUSCULAR | Status: AC
  Administered 2014-01-01: 4 mg via INTRAMUSCULAR

## 2014-01-01 NOTE — Progress Notes (Signed)
Pt states he has been off Plavix for 5 days.  Discharge instructions explained to pt.

## 2014-01-01 NOTE — Discharge Instructions (Signed)
Myelogram Discharge Instructions  1. Go home and rest quietly for the next 24 hours.  It is important to lie flat for the next 24 hours.  Get up only to go to the restroom.  You may lie in the bed or on a couch on your back, your stomach, your left side or your right side.  You may have one pillow under your head.  You may have pillows between your knees while you are on your side or under your knees while you are on your back.  2. DO NOT drive today.  Recline the seat as far back as it will go, while still wearing your seat belt, on the way home.  3. You may get up to go to the bathroom as needed.  You may sit up for 10 minutes to eat.  You may resume your normal diet and medications unless otherwise indicated.  Drink lots of extra fluids today and tomorrow.  4. The incidence of headache, nausea, or vomiting is about 5% (one in 20 patients).  If you develop a headache, lie flat and drink plenty of fluids until the headache goes away.  Caffeinated beverages may be helpful.  If you develop severe nausea and vomiting or a headache that does not go away with flat bed rest, call [phone].  5. You may resume normal activities after your 24 hours of bed rest is over; however, do not exert yourself strongly or do any heavy lifting tomorrow. If when you get up you have a headache when standing, go back to bed and force fluids for another 24 hours.  6. Call your physician for a follow-up appointment.  The results of your myelogram will be sent directly to your physician by the following day.  7. If you have any questions or if complications develop after you arrive home, please call [phone].  Discharge instructions have been explained to the patient.  The patient, or the person responsible for the patient, fully understands these instructions.      May resume Plavix today.

## 2014-01-31 ENCOUNTER — Other Ambulatory Visit: Payer: Self-pay | Admitting: Orthopedic Surgery

## 2014-01-31 NOTE — H&P (Signed)
[name] is an 80 y.o. male.   Chief Complaint: back and leg pain HPI: The patient is a 80 year old male who presents today for follow up of their back. The patient is being followed for their low back pain. They are now 4 month(s) out from when symptoms began. Symptoms reported today include: pain and leg pain. The patient states that they are doing well. Current treatment includes: relative rest, activity modification, NSAIDs, pain medications and use of a cane. The following medication has been used for pain control: Norco and ibuprofen. The patient reports their current pain level to be severe. The patient presents today following CT/Myelogram.  The patient reports severe pain radiating down into both legs when he attempts to stand. He is here with his wife.  Past Medical History  Diagnosis Date  . CAD (coronary artery disease) 06/26/2012    PTCA RCA 1995 CABG w. LIMA to LAD, SVG to dx 1, PD and PL March 2000 Dr. Servando Snare for 3VD Cypher stent SVG to RCA  2005 redo CABG 07/2005 with SVG to OM and SVG to PD-PL    . H/O prostate cancer     Prior cryoablation   . Hyperlipidemia 06/26/2012  . S/P CABG (coronary artery bypass graft)   . Villous adenoma of rectum   . Anxiety     Past Surgical History  Procedure Laterality Date  . Coronary artery bypass graft  2000, 2007    LIMA and SVGs to Diag 1 PDA PLR March 2000,  SVG to OM and SVG to PDA-PL 07/2005  . Partial colectomy    . Prostate cryoablation    . Hernia repair    . Nasal septum surgery      No family history on file. Social History:  reports that he has quit smoking. He has never used smokeless tobacco. He reports that he drinks about .5 ounces of alcohol per week. He reports that he does not use illicit drugs.  Allergies:  Allergies  Allergen Reactions  . Fenofibrate   . Gemfibrozil   . Ranexa [Ranolazine]   . Red Yeast Rice [Cholestin]   . Statins   . Zetia [Ezetimibe]      (Not in a hospital admission)  No  results found for this or any previous visit (from the past 48 hour(s)). No results found.  Review of Systems  Constitutional: Negative.   HENT: Negative.   Eyes: Negative.   Respiratory: Negative.   Cardiovascular: Negative.   Gastrointestinal: Negative.   Genitourinary: Negative.   Musculoskeletal: Positive for back pain.  Skin: Negative.   Neurological: Positive for sensory change and focal weakness.  Psychiatric/Behavioral: Negative.     There were no vitals taken for this visit. Physical Exam  Constitutional: He is oriented to person, place, and time. He appears well-developed and well-nourished.  HENT:  Head: Normocephalic and atraumatic.  Eyes: Conjunctivae and EOM are normal. Pupils are equal, round, and reactive to light.  Neck: Normal range of motion. Neck supple.  Cardiovascular: Normal rate and regular rhythm.   Respiratory: Effort normal and breath sounds normal.  GI: Soft. Bowel sounds are normal.  Musculoskeletal:  On exam he walks with a forward flexed gait. Moderate distress. Straight leg raise produces buttock and thigh pain bilaterally. Trace EHL weakness. Limited flexion and extension of the lumbar spine.  Lumbar spine exam reveals no evidence of soft tissue swelling, no evidence of soft tissue swelling or deformity or skin ecchymosis. On palpation there is no  tenderness of the lumbar spine. No flank pain with percussion. The abdomen is soft and nontender. Nontender over the trochanters. No cellulitis or lymphadenopathy.  Motor is 5/5 including tibialis anterior, plantarflexion, quadriceps and hamstrings. The patient is normoreflexic. There is no Babinski or clonus. Sensory exam is intact to light touch. The patient has good distal pulses. No DVT. No pain and normal range of motion without instability of the hips, knees and ankles. Inspection of the cervical spine reveals a normal lordosis without evidence of paraspinous spasms or soft tissue swelling.  Nontender to palpation. Full flexion, full extension, full left and right lateral rotation. Extension combined with lateral flexion does not reproduce pain. Negative impingement sign, negative secondary impingement sign of the shoulders. Negative Tinel's at median and ulnar nerves at the elbow. Negative carpal compression test at the wrists. Motor of the upper extremities is 5/5 including biceps, triceps, brachioradialis,wrist flexion, wrist extension, finger flexion, finger extension. Reflexes are normoreflexic. Sensory exam is intact to light touch. There is no Hoffmann sign. Nontender over the thoracic spine.  Neurological: He is alert and oriented to person, place, and time. He has normal reflexes.  Skin: Skin is warm and dry.  Psychiatric: He has a normal mood and affect.    Myelogram demonstrates severe multifactorial stenosis at 4-5, central and lateral recess. Compression of both 5 roots. Facet arthrosis at 5-1 and at 3-4, moderate stenosis at 3-4.  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  We had an extensive discussion concerning current pathology, relevant anatomy and treatment options.  Consider lumbar decompression at 4-5 and possibly at 3-4.  I had an extensive discussion of the risks and benefits of lumbar decompression with the patient including bleeding, infection, damage to neurovascular structures, epidural fibrosis, CSF leakage requiring repair. We also discussed increase in pain, adjacent segment disease, recurrent disc herniation, need for future surgery including repeat decompression and/or fusion. We also discussed risks of postoperative hematoma, paralysis, anesthetic complications including DVT, PE, death, cardiopulmonary dysfunction. In addition, the  perioperative and postoperative courses were discussed in detail including the rehabilitative time and return to functional activity and work. I provided the patient with an illustrated handout and utilized the appropriate surgical models.  This would require preoperative clearance. It is appropriate to consider the perioperative risks. We appreciate the kind input by Dr. Shon Baton and Dr. Tollie Eth for that purpose. We spent a considerable time discussing that as well as with his wife. Currently he is unable to live with his symptoms as they currently exist. He has had no help from the epidurals, activity modifications, or analgesics. Fortunately no change in bowel or bladder function. Discussed the perioperative course, two hours in duration, overnight stay, immediate ambulation and physical therapy. Anticipated blood loss is minimal. They would like to proceed.  I called the patient at home and discussed the conversation with Dr. Wynonia Lawman and also indicated I have confirmed with Dr. Gladstone Lighter and he is in agreement. I would not proceed with operation on the spine in the presence of an anticoagulant due to unacceptable risk of postoperative hematoma and the result in paralysis. I spoke with the patient about that indicating he would have to be off his aspirin at least a week before and his Plavix five days before. He will speak directly to Dr. Tollie Eth his cardiologist further in terms of  the risks associated with that. I have indicated that it is balancing risks versus benefits, and again I asked him whether he is disabled by his symptoms and he indicates that his symptoms are, therefore he will have that discussion with Dr. Wynonia Lawman.  Plan microlumbar decompression L3-4, L4-5  BISSELL, JACLYN M. PA-C for Dr. Tonita Cong 01/31/2014, 10:13 PM

## 2014-02-10 ENCOUNTER — Encounter (HOSPITAL_COMMUNITY): Payer: Self-pay | Admitting: Pharmacy Technician

## 2014-02-13 ENCOUNTER — Encounter (HOSPITAL_COMMUNITY)
Admission: RE | Admit: 2014-02-13 | Discharge: 2014-02-13 | Disposition: A | Discharge status: Home / Self Care | Payer: Medicare Other | Source: Ambulatory Visit | Attending: Specialist | Admitting: Specialist

## 2014-02-13 ENCOUNTER — Ambulatory Visit (HOSPITAL_COMMUNITY)
Admission: RE | Admit: 2014-02-13 | Discharge: 2014-02-13 | Disposition: A | Discharge status: Home / Self Care | Payer: Medicare Other | Source: Ambulatory Visit | Attending: Orthopedic Surgery | Admitting: Orthopedic Surgery

## 2014-02-13 ENCOUNTER — Encounter (HOSPITAL_COMMUNITY): Payer: Self-pay

## 2014-02-13 DIAGNOSIS — M545 Low back pain, unspecified: Secondary | ICD-10-CM | POA: Diagnosis present

## 2014-02-13 DIAGNOSIS — M5137 Other intervertebral disc degeneration, lumbosacral region: Secondary | ICD-10-CM | POA: Diagnosis not present

## 2014-02-13 DIAGNOSIS — M51379 Other intervertebral disc degeneration, lumbosacral region without mention of lumbar back pain or lower extremity pain: Secondary | ICD-10-CM | POA: Insufficient documentation

## 2014-02-13 LAB — BASIC METABOLIC PANEL
ANION GAP: 11 (ref 5–15)
BUN: 13 mg/dL (ref 6–23)
CHLORIDE: 106 meq/L (ref 96–112)
CO2: 28 meq/L (ref 19–32)
Calcium: 9.5 mg/dL (ref 8.4–10.5)
Creatinine, Ser: 1.07 mg/dL (ref 0.50–1.35)
GFR calc Af Amer: 71 mL/min — ABNORMAL LOW (ref >90)
GFR calc non Af Amer: 61 mL/min — ABNORMAL LOW (ref >90)
Glucose, Bld: 87 mg/dL (ref 70–99)
Potassium: 4.3 mEq/L (ref 3.7–5.3)
Sodium: 145 mEq/L (ref 137–147)

## 2014-02-13 LAB — PROTIME-INR
INR: 1.24 (ref 0.00–1.49)
Prothrombin Time: 15.6 seconds — ABNORMAL HIGH (ref 11.6–15.2)

## 2014-02-13 LAB — CBC
HEMATOCRIT: 42.8 % (ref 39.0–52.0)
HEMOGLOBIN: 14.2 g/dL (ref 13.0–17.0)
MCH: 28.7 pg (ref 26.0–34.0)
MCHC: 33.2 g/dL (ref 30.0–36.0)
MCV: 86.5 fL (ref 78.0–100.0)
Platelets: 109 10*3/uL — ABNORMAL LOW (ref 150–400)
RBC: 4.95 MIL/uL (ref 4.22–5.81)
RDW: 14.8 % (ref 11.5–15.5)
WBC: 7.2 10*3/uL (ref 4.0–10.5)

## 2014-02-13 LAB — SURGICAL PCR SCREEN
MRSA, PCR: NEGATIVE
STAPHYLOCOCCUS AUREUS: NEGATIVE

## 2014-02-13 NOTE — Pre-Procedure Instructions (Signed)
PT'S PREOP LABS FAXED TO DR. Reather Littler OFFICE WITH NOTE THAT PREOP PLATELET CT 109,000.

## 2014-02-13 NOTE — Patient Instructions (Addendum)
YOUR SURGERY IS SCHEDULED AT [facility]  ON:   Wednesday  8/19  REPORT TO  SHORT STAY CENTER AT:  9:00 AM   PLEASE COME IN THE [facility] MAIN HOSPITAL ENTRANCE AND FOLLOW SIGNS TO SHORT STAY CENTER.  DO NOT EAT OR DRINK ANYTHING AFTER MIDNIGHT THE NIGHT BEFORE YOUR SURGERY.  YOU MAY BRUSH YOUR TEETH, RINSE OUT YOUR MOUTH--BUT NO WATER, NO FOOD, NO CHEWING GUM, NO MINTS, NO CANDIES, NO CHEWING TOBACCO.  PLEASE TAKE THE FOLLOWING MEDICATIONS THE AM OF YOUR SURGERY WITH A FEW SIPS OF WATER:   AMLODIPINE, PROSCAR, ISOSORBIDE, METOPROLOL,  BRING YOUR NITROGLYCERIN, USE YOUR TIMOLOL EYE DROPS.  DO NOT BRING VALUABLES, MONEY, CREDIT CARDS.  DO NOT WEAR JEWELRY, MAKE-UP, NAIL POLISH AND NO METAL PINS OR CLIPS IN YOUR HAIR. CONTACT LENS, DENTURES / PARTIALS, GLASSES SHOULD NOT BE WORN TO SURGERY AND IN MOST CASES-HEARING AIDS WILL NEED TO BE REMOVED.  BRING YOUR GLASSES CASE, ANY EQUIPMENT NEEDED FOR YOUR CONTACT LENS. FOR PATIENTS ADMITTED TO THE HOSPITAL--CHECK OUT TIME THE DAY OF DISCHARGE IS 11:00 AM.  ALL INPATIENT ROOMS ARE PRIVATE - WITH BATHROOM, TELEPHONE, TELEVISION AND WIFI INTERNET.                                                    PLEASE READ OVER ANY  FACT SHEETS THAT YOU WERE GIVEN: MRSA INFORMATION, BLOOD TRANSFUSION INFORMATION, INCENTIVE SPIROMETER INFORMATION.  PLEASE BE AWARE THAT YOU MAY NEED ADDITIONAL BLOOD DRAWN DAY OF YOUR SURGERY  _______________________________________________________________________   [facility] - Preparing for Surgery Before surgery, you can play an important role.  Because skin is not sterile, your skin needs to be as free of germs as possible.  You can reduce the number of germs on your skin by washing with CHG (chlorahexidine gluconate) soap before surgery.  CHG is an antiseptic cleaner which kills germs and bonds with the skin to continue killing germs even after washing. Please DO NOT use if you have an allergy to CHG or  antibacterial soaps.  If your skin becomes reddened/irritated stop using the CHG and inform your nurse when you arrive at Short Stay. Do not shave (including legs and underarms) for at least 48 hours prior to the first CHG shower.  You may shave your face/neck. Please follow these instructions carefully:  1.  Shower with CHG Soap the night before surgery and the  morning of Surgery.  2.  If you choose to wash your hair, wash your hair first as usual with your  normal  shampoo.  3.  After you shampoo, rinse your hair and body thoroughly to remove the  shampoo.                           4.  Use CHG as you would any other liquid soap.  You can apply chg directly  to the skin and wash                       Gently with a scrungie or clean washcloth.  5.  Apply the CHG Soap to your body ONLY FROM THE NECK DOWN.   Do not use on face/ open  Wound or open sores. Avoid contact with eyes, ears mouth and genitals (private parts).                       Wash face,  Genitals (private parts) with your normal soap.             6.  Wash thoroughly, paying special attention to the area where your surgery  will be performed.  7.  Thoroughly rinse your body with warm water from the neck down.  8.  DO NOT shower/wash with your normal soap after using and rinsing off  the CHG Soap.                9.  Pat yourself dry with a clean towel.            10.  Wear clean pajamas.            11.  Place clean sheets on your bed the night of your first shower and do not  sleep with pets. Day of Surgery : Do not apply any lotions/deodorants the morning of surgery.  Please wear clean clothes to the hospital/surgery center.  FAILURE TO FOLLOW THESE INSTRUCTIONS MAY RESULT IN THE CANCELLATION OF YOUR SURGERY PATIENT SIGNATURE_________________________________  NURSE SIGNATURE__________________________________  ________________________________________________________________________   [name]  An incentive spirometer is a tool that can help keep your lungs clear and active. This tool measures how well you are filling your lungs with each breath. Taking long deep breaths may help reverse or decrease the chance of developing breathing (pulmonary) problems (especially infection) following:  A long period of time when you are unable to move or be active. BEFORE THE PROCEDURE   If the spirometer includes an indicator to show your best effort, your nurse or respiratory therapist will set it to a desired goal.  If possible, sit up straight or lean slightly forward. Try not to slouch.  Hold the incentive spirometer in an upright position. INSTRUCTIONS FOR USE  1. Sit on the edge of your bed if possible, or sit up as far as you can in bed or on a chair. 2. Hold the incentive spirometer in an upright position. 3. Breathe out normally. 4. Place the mouthpiece in your mouth and seal your lips tightly around it. 5. Breathe in slowly and as deeply as possible, raising the piston or the ball toward the top of the column. 6. Hold your breath for 3-5 seconds or for as long as possible. Allow the piston or ball to fall to the bottom of the column. 7. Remove the mouthpiece from your mouth and breathe out normally. 8. Rest for a few seconds and repeat Steps 1 through 7 at least 10 times every 1-2 hours when you are awake. Take your time and take a few normal breaths between deep breaths. 9. The spirometer may include an indicator to show your best effort. Use the indicator as a goal to work toward during each repetition. 10. After each set of 10 deep breaths, practice coughing to be sure your lungs are clear. If you have an incision (the cut made at the time of surgery), support your incision when coughing by placing a pillow or rolled up towels firmly against it. Once you are able to get out of bed, walk around indoors and cough well. You may stop using the incentive spirometer when  instructed by your caregiver.  RISKS AND COMPLICATIONS  Take your time so you do not  get dizzy or light-headed.  If you are in pain, you may need to take or ask for pain medication before doing incentive spirometry. It is harder to take a deep breath if you are having pain. AFTER USE  Rest and breathe slowly and easily.  It can be helpful to keep track of a log of your progress. Your caregiver can provide you with a simple table to help with this. If you are using the spirometer at home, follow these instructions: [place] IF:   You are having difficultly using the spirometer.  You have trouble using the spirometer as often as instructed.  Your pain medication is not giving enough relief while using the spirometer.  You develop fever of 100.5 F (38.1 C) or higher. SEEK IMMEDIATE MEDICAL CARE IF:   You cough up bloody sputum that had not been present before.  You develop fever of 102 F (38.9 C) or greater.  You develop worsening pain at or near the incision site. MAKE SURE YOU:   Understand these instructions.  Will watch your condition.  Will get help right away if you are not doing well or get worse. Document Released: 10/31/2006 Document Revised: 09/12/2011 Document Reviewed: 01/01/2007 ExitCare Patient Information 2014 ExitCare, [place].   ________________________________________________________________________  WHAT IS A BLOOD TRANSFUSION? Blood Transfusion Information  A transfusion is the replacement of blood or some of its parts. Blood is made up of multiple cells which provide different functions.  Red blood cells carry oxygen and are used for blood loss replacement.  White blood cells fight against infection.  Platelets control bleeding.  Plasma helps clot blood.  Other blood products are available for specialized needs, such as hemophilia or other clotting disorders. BEFORE THE TRANSFUSION  Who gives blood for transfusions?   Healthy  volunteers who are fully evaluated to make sure their blood is safe. This is blood bank blood. Transfusion therapy is the safest it has ever been in the practice of medicine. Before blood is taken from a donor, a complete history is taken to make sure that person has no history of diseases nor engages in risky social behavior (examples are intravenous drug use or sexual activity with multiple partners). The donor's travel history is screened to minimize risk of transmitting infections, such as malaria. The donated blood is tested for signs of infectious diseases, such as HIV and hepatitis. The blood is then tested to be sure it is compatible with you in order to minimize the chance of a transfusion reaction. If you or a relative donates blood, this is often done in anticipation of surgery and is not appropriate for emergency situations. It takes many days to process the donated blood. RISKS AND COMPLICATIONS Although transfusion therapy is very safe and saves many lives, the main dangers of transfusion include:   Getting an infectious disease.  Developing a transfusion reaction. This is an allergic reaction to something in the blood you were given. Every precaution is taken to prevent this. The decision to have a blood transfusion has been considered carefully by your caregiver before blood is given. Blood is not given unless the benefits outweigh the risks. AFTER THE TRANSFUSION  Right after receiving a blood transfusion, you will usually feel much better and more energetic. This is especially true if your red blood cells have gotten low (anemic). The transfusion raises the level of the red blood cells which carry oxygen, and this usually causes an energy increase.  The nurse administering the transfusion will  monitor you carefully for complications. HOME CARE INSTRUCTIONS  No special instructions are needed after a transfusion. You may find your energy is better. Speak with your caregiver about any  limitations on activity for underlying diseases you may have. SEEK MEDICAL CARE IF:   Your condition is not improving after your transfusion.  You develop redness or irritation at the intravenous (IV) site. SEEK IMMEDIATE MEDICAL CARE IF:  Any of the following symptoms occur over the next 12 hours:  Shaking chills.  You have a temperature by mouth above 102 F (38.9 C), not controlled by medicine.  Chest, back, or muscle pain.  People around you feel you are not acting correctly or are confused.  Shortness of breath or difficulty breathing.  Dizziness and fainting.  You get a rash or develop hives.  You have a decrease in urine output.  Your urine turns a dark color or changes to pink, red, or brown. Any of the following symptoms occur over the next 10 days:  You have a temperature by mouth above 102 F (38.9 C), not controlled by medicine.  Shortness of breath.  Weakness after normal activity.  The white part of the eye turns yellow (jaundice).  You have a decrease in the amount of urine or are urinating less often.  Your urine turns a dark color or changes to pink, red, or brown. Document Released: 06/17/2000 Document Revised: 09/12/2011 Document Reviewed: 02/04/2008 ExitCare Patient Information 2014 [employer].  _______________________________________________________________________  PT'S WIFE BETTY Pair NOTIFIED OF SURGERY TIME CHANGE TO 10:15 AM AND PT TO ARRIVE TO SHORT STAY BY 8:15 AM.

## 2014-02-13 NOTE — Pre-Procedure Instructions (Signed)
PT HAS EKG AND OV NOTES FROM 01-13-14 VISIT WITH CARDIOLOGIST DR. TILLEY - REPORTS ON CHART. CXR REPORT 02-26-13 IN EPIC.

## 2014-02-19 ENCOUNTER — Inpatient Hospital Stay (HOSPITAL_COMMUNITY): Payer: Medicare Other

## 2014-02-19 ENCOUNTER — Encounter (HOSPITAL_COMMUNITY): Payer: Medicare Other | Admitting: Certified Registered Nurse Anesthetist

## 2014-02-19 ENCOUNTER — Inpatient Hospital Stay (HOSPITAL_COMMUNITY)
Admission: RE | Admit: 2014-02-19 | Discharge: 2014-02-23 | DRG: 520 | Disposition: A | Discharge status: Home / Self Care | Payer: Medicare Other | Source: Ambulatory Visit | Attending: Specialist | Admitting: Specialist

## 2014-02-19 ENCOUNTER — Inpatient Hospital Stay (HOSPITAL_COMMUNITY): Payer: Medicare Other | Admitting: Certified Registered Nurse Anesthetist

## 2014-02-19 ENCOUNTER — Encounter (HOSPITAL_COMMUNITY)
Admission: RE | Disposition: A | Discharge status: Home / Self Care | Payer: Self-pay | Source: Ambulatory Visit | Attending: Specialist

## 2014-02-19 ENCOUNTER — Encounter (HOSPITAL_COMMUNITY): Payer: Self-pay | Admitting: *Deleted

## 2014-02-19 DIAGNOSIS — M5137 Other intervertebral disc degeneration, lumbosacral region: Secondary | ICD-10-CM | POA: Diagnosis present

## 2014-02-19 DIAGNOSIS — M48061 Spinal stenosis, lumbar region without neurogenic claudication: Secondary | ICD-10-CM | POA: Diagnosis present

## 2014-02-19 DIAGNOSIS — M48062 Spinal stenosis, lumbar region with neurogenic claudication: Principal | ICD-10-CM | POA: Diagnosis present

## 2014-02-19 DIAGNOSIS — Z87891 Personal history of nicotine dependence: Secondary | ICD-10-CM

## 2014-02-19 DIAGNOSIS — M51379 Other intervertebral disc degeneration, lumbosacral region without mention of lumbar back pain or lower extremity pain: Secondary | ICD-10-CM | POA: Diagnosis present

## 2014-02-19 DIAGNOSIS — I2510 Atherosclerotic heart disease of native coronary artery without angina pectoris: Secondary | ICD-10-CM | POA: Diagnosis present

## 2014-02-19 DIAGNOSIS — Z9861 Coronary angioplasty status: Secondary | ICD-10-CM

## 2014-02-19 DIAGNOSIS — Z951 Presence of aortocoronary bypass graft: Secondary | ICD-10-CM

## 2014-02-19 DIAGNOSIS — I4510 Unspecified right bundle-branch block: Secondary | ICD-10-CM | POA: Diagnosis present

## 2014-02-19 HISTORY — PX: LUMBAR LAMINECTOMY/DECOMPRESSION MICRODISCECTOMY: SHX5026

## 2014-02-19 LAB — TYPE AND SCREEN
ABO/RH(D): O POS
Antibody Screen: NEGATIVE

## 2014-02-19 SURGERY — LUMBAR LAMINECTOMY/DECOMPRESSION MICRODISCECTOMY 2 LEVELS
Anesthesia: General | Site: Back

## 2014-02-19 MED ORDER — LIDOCAINE HCL (CARDIAC) 20 MG/ML IV SOLN
INTRAVENOUS | Status: AC
  Filled 2014-02-19: qty 5

## 2014-02-19 MED ORDER — TIMOLOL MALEATE (ONCE-DAILY) 0.5 % OP SOLN: 1.0000 [drp] | Freq: Every morning | OPHTHALMIC | Status: DC

## 2014-02-19 MED ORDER — BUPIVACAINE-EPINEPHRINE (PF) 0.5% -1:200000 IJ SOLN
INTRAMUSCULAR | Status: AC
  Filled 2014-02-19: qty 30

## 2014-02-19 MED ORDER — FENTANYL CITRATE 0.05 MG/ML IJ SOLN
INTRAMUSCULAR | Status: DC | PRN
  Administered 2014-02-19 (×4): 50 ug via INTRAVENOUS

## 2014-02-19 MED ORDER — ISOSORBIDE MONONITRATE ER 60 MG PO TB24
120.0000 mg | ORAL_TABLET | Freq: Every morning | ORAL | Status: DC
  Administered 2014-02-20 – 2014-02-23 (×4): 120 mg via ORAL
  Filled 2014-02-19 (×4): qty 2

## 2014-02-19 MED ORDER — MENTHOL 3 MG MT LOZG
1.0000 | LOZENGE | OROMUCOSAL | Status: DC | PRN
  Filled 2014-02-19: qty 9

## 2014-02-19 MED ORDER — CEFAZOLIN SODIUM-DEXTROSE 2-3 GM-% IV SOLR
2.0000 g | Freq: Three times a day (TID) | INTRAVENOUS | Status: AC
  Administered 2014-02-19 – 2014-02-20 (×3): 2 g via INTRAVENOUS
  Filled 2014-02-19 (×3): qty 50

## 2014-02-19 MED ORDER — CEFAZOLIN SODIUM-DEXTROSE 2-3 GM-% IV SOLR
2.0000 g | INTRAVENOUS | Status: AC
  Administered 2014-02-19: 2 g via INTRAVENOUS

## 2014-02-19 MED ORDER — DOCUSATE SODIUM 100 MG PO CAPS: 100.0000 mg | ORAL_CAPSULE | Freq: Two times a day (BID) | ORAL | Status: DC | PRN

## 2014-02-19 MED ORDER — LACTATED RINGERS IV SOLN: INTRAVENOUS | Status: DC

## 2014-02-19 MED ORDER — AMLODIPINE BESYLATE 5 MG PO TABS
5.0000 mg | ORAL_TABLET | Freq: Every morning | ORAL | Status: DC
  Administered 2014-02-20 – 2014-02-23 (×4): 5 mg via ORAL
  Filled 2014-02-19 (×4): qty 1

## 2014-02-19 MED ORDER — DOCUSATE SODIUM 100 MG PO CAPS
100.0000 mg | ORAL_CAPSULE | Freq: Two times a day (BID) | ORAL | Status: DC
  Administered 2014-02-19 – 2014-02-23 (×8): 100 mg via ORAL

## 2014-02-19 MED ORDER — HYDROCODONE-ACETAMINOPHEN 5-325 MG PO TABS: 1.0000 | ORAL_TABLET | ORAL | Status: DC | PRN

## 2014-02-19 MED ORDER — HYDROCODONE-ACETAMINOPHEN 5-325 MG PO TABS
1.0000 | ORAL_TABLET | ORAL | Status: DC | PRN
  Administered 2014-02-19 – 2014-02-22 (×2): 2 via ORAL
  Administered 2014-02-23: 1 via ORAL
  Filled 2014-02-19: qty 1
  Filled 2014-02-19 (×2): qty 2

## 2014-02-19 MED ORDER — NEOSTIGMINE METHYLSULFATE 10 MG/10ML IV SOLN
INTRAVENOUS | Status: DC | PRN
  Administered 2014-02-19: 4 mg via INTRAVENOUS

## 2014-02-19 MED ORDER — OXYCODONE-ACETAMINOPHEN 5-325 MG PO TABS
1.0000 | ORAL_TABLET | ORAL | Status: DC | PRN
  Administered 2014-02-19 – 2014-02-23 (×7): 2 via ORAL
  Filled 2014-02-19 (×7): qty 2

## 2014-02-19 MED ORDER — SODIUM CHLORIDE 0.9 % IJ SOLN: 3.0000 mL | INTRAMUSCULAR | Status: DC | PRN

## 2014-02-19 MED ORDER — ACETAMINOPHEN 325 MG PO TABS
650.0000 mg | ORAL_TABLET | ORAL | Status: DC | PRN
Start: 2014-02-19 — End: 2014-02-23
  Administered 2014-02-21 – 2014-02-23 (×4): 650 mg via ORAL
  Filled 2014-02-19 (×4): qty 2

## 2014-02-19 MED ORDER — GLYCOPYRROLATE 0.2 MG/ML IJ SOLN
INTRAMUSCULAR | Status: AC
  Filled 2014-02-19: qty 1

## 2014-02-19 MED ORDER — MIDAZOLAM HCL 2 MG/2ML IJ SOLN
INTRAMUSCULAR | Status: AC
  Filled 2014-02-19: qty 2

## 2014-02-19 MED ORDER — EPHEDRINE SULFATE 50 MG/ML IJ SOLN
INTRAMUSCULAR | Status: DC | PRN
  Administered 2014-02-19 (×2): 5 mg via INTRAVENOUS

## 2014-02-19 MED ORDER — FINASTERIDE 5 MG PO TABS
5.0000 mg | ORAL_TABLET | Freq: Every morning | ORAL | Status: DC
  Administered 2014-02-20 – 2014-02-23 (×4): 5 mg via ORAL
  Filled 2014-02-19 (×4): qty 1

## 2014-02-19 MED ORDER — PHENYLEPHRINE 40 MCG/ML (10ML) SYRINGE FOR IV PUSH (FOR BLOOD PRESSURE SUPPORT)
PREFILLED_SYRINGE | INTRAVENOUS | Status: AC
  Filled 2014-02-19: qty 10

## 2014-02-19 MED ORDER — DEXAMETHASONE SODIUM PHOSPHATE 10 MG/ML IJ SOLN
INTRAMUSCULAR | Status: DC | PRN
  Administered 2014-02-19: 10 mg via INTRAVENOUS

## 2014-02-19 MED ORDER — LATANOPROST 0.005 % OP SOLN
1.0000 [drp] | Freq: Every day | OPHTHALMIC | Status: AC
  Administered 2014-02-19: 1 [drp] via OPHTHALMIC
  Filled 2014-02-19: qty 2.5

## 2014-02-19 MED ORDER — METHOCARBAMOL 500 MG PO TABS
500.0000 mg | ORAL_TABLET | Freq: Four times a day (QID) | ORAL | Status: DC | PRN
  Administered 2014-02-19 – 2014-02-22 (×6): 500 mg via ORAL
  Filled 2014-02-19 (×6): qty 1

## 2014-02-19 MED ORDER — ONDANSETRON HCL 4 MG/2ML IJ SOLN: 4.0000 mg | INTRAMUSCULAR | Status: DC | PRN

## 2014-02-19 MED ORDER — PHENYLEPHRINE HCL 10 MG/ML IJ SOLN
INTRAMUSCULAR | Status: DC | PRN
  Administered 2014-02-19 (×4): 80 ug via INTRAVENOUS

## 2014-02-19 MED ORDER — THROMBIN 5000 UNITS EX SOLR
CUTANEOUS | Status: AC
  Filled 2014-02-19: qty 10000

## 2014-02-19 MED ORDER — METOPROLOL TARTRATE 50 MG PO TABS
75.0000 mg | ORAL_TABLET | Freq: Two times a day (BID) | ORAL | Status: DC
  Administered 2014-02-19 – 2014-02-23 (×7): 75 mg via ORAL
  Filled 2014-02-19 (×10): qty 1

## 2014-02-19 MED ORDER — LACTATED RINGERS IV SOLN
INTRAVENOUS | Status: DC
  Administered 2014-02-19 (×2): via INTRAVENOUS

## 2014-02-19 MED ORDER — PHENYLEPHRINE HCL 10 MG/ML IJ SOLN
10.0000 mg | INTRAVENOUS | Status: DC | PRN
  Administered 2014-02-19: 60 ug/min via INTRAVENOUS

## 2014-02-19 MED ORDER — PHENYLEPHRINE HCL 10 MG/ML IJ SOLN
INTRAMUSCULAR | Status: AC
  Filled 2014-02-19: qty 1

## 2014-02-19 MED ORDER — CEFAZOLIN SODIUM-DEXTROSE 2-3 GM-% IV SOLR
INTRAVENOUS | Status: AC
  Filled 2014-02-19: qty 50

## 2014-02-19 MED ORDER — GLYCOPYRROLATE 0.2 MG/ML IJ SOLN
INTRAMUSCULAR | Status: AC
  Filled 2014-02-19: qty 3

## 2014-02-19 MED ORDER — PROPOFOL 10 MG/ML IV BOLUS
INTRAVENOUS | Status: AC
  Filled 2014-02-19: qty 20

## 2014-02-19 MED ORDER — LIDOCAINE HCL (CARDIAC) 20 MG/ML IV SOLN
INTRAVENOUS | Status: DC | PRN
  Administered 2014-02-19: 80 mg via INTRAVENOUS

## 2014-02-19 MED ORDER — ROCURONIUM BROMIDE 100 MG/10ML IV SOLN
INTRAVENOUS | Status: DC | PRN
  Administered 2014-02-19: 30 mg via INTRAVENOUS
  Administered 2014-02-19: 10 mg via INTRAVENOUS

## 2014-02-19 MED ORDER — ONDANSETRON HCL 4 MG/2ML IJ SOLN
INTRAMUSCULAR | Status: DC | PRN
  Administered 2014-02-19: 4 mg via INTRAVENOUS

## 2014-02-19 MED ORDER — HYDROMORPHONE HCL PF 1 MG/ML IJ SOLN
0.5000 mg | INTRAMUSCULAR | Status: DC | PRN
  Administered 2014-02-20: 1 mg via INTRAVENOUS
  Filled 2014-02-19: qty 1

## 2014-02-19 MED ORDER — NEOSTIGMINE METHYLSULFATE 10 MG/10ML IV SOLN
INTRAVENOUS | Status: AC
  Filled 2014-02-19: qty 1

## 2014-02-19 MED ORDER — TIMOLOL MALEATE 0.5 % OP SOLG
1.0000 [drp] | Freq: Every day | OPHTHALMIC | Status: DC
  Administered 2014-02-20 – 2014-02-23 (×4): 1 [drp] via OPHTHALMIC
  Filled 2014-02-19: qty 5

## 2014-02-19 MED ORDER — PROPOFOL 10 MG/ML IV BOLUS
INTRAVENOUS | Status: DC | PRN
  Administered 2014-02-19: 120 mg via INTRAVENOUS

## 2014-02-19 MED ORDER — SODIUM CHLORIDE 0.9 % IJ SOLN
3.0000 mL | Freq: Two times a day (BID) | INTRAMUSCULAR | Status: DC
  Administered 2014-02-21: 3 mL via INTRAVENOUS

## 2014-02-19 MED ORDER — THROMBIN 5000 UNITS EX SOLR
CUTANEOUS | Status: DC | PRN
  Administered 2014-02-19: 10000 [IU] via TOPICAL

## 2014-02-19 MED ORDER — HYDROMORPHONE HCL PF 1 MG/ML IJ SOLN
0.2500 mg | INTRAMUSCULAR | Status: DC | PRN
  Administered 2014-02-19 (×2): 0.25 mg via INTRAVENOUS
  Administered 2014-02-19: 0.5 mg via INTRAVENOUS

## 2014-02-19 MED ORDER — GLYCOPYRROLATE 0.2 MG/ML IJ SOLN
INTRAMUSCULAR | Status: DC | PRN
  Administered 2014-02-19: 0.6 mg via INTRAVENOUS

## 2014-02-19 MED ORDER — ONDANSETRON HCL 4 MG/2ML IJ SOLN
INTRAMUSCULAR | Status: AC
  Filled 2014-02-19: qty 2

## 2014-02-19 MED ORDER — SODIUM CHLORIDE 0.9 % IV SOLN: 250.0000 mL | INTRAVENOUS | Status: DC

## 2014-02-19 MED ORDER — METHOCARBAMOL 1000 MG/10ML IJ SOLN
500.0000 mg | Freq: Four times a day (QID) | INTRAVENOUS | Status: DC | PRN
  Administered 2014-02-19: 500 mg via INTRAVENOUS
  Filled 2014-02-19: qty 5

## 2014-02-19 MED ORDER — ROCURONIUM BROMIDE 100 MG/10ML IV SOLN
INTRAVENOUS | Status: AC
  Filled 2014-02-19: qty 1

## 2014-02-19 MED ORDER — ACETAMINOPHEN 10 MG/ML IV SOLN
1000.0000 mg | Freq: Once | INTRAVENOUS | Status: AC
  Administered 2014-02-19: 1000 mg via INTRAVENOUS
  Filled 2014-02-19: qty 100

## 2014-02-19 MED ORDER — FENTANYL CITRATE 0.05 MG/ML IJ SOLN
INTRAMUSCULAR | Status: AC
  Filled 2014-02-19: qty 5

## 2014-02-19 MED ORDER — ACETAMINOPHEN 650 MG RE SUPP: 650.0000 mg | RECTAL | Status: DC | PRN

## 2014-02-19 MED ORDER — PHENOL 1.4 % MT LIQD
1.0000 | OROMUCOSAL | Status: DC | PRN
  Filled 2014-02-19: qty 177

## 2014-02-19 MED ORDER — POLYMYXIN B SULFATE 500000 UNITS IJ SOLR
INTRAMUSCULAR | Status: DC | PRN
  Administered 2014-02-19: 12:00:00

## 2014-02-19 MED ORDER — ALFUZOSIN HCL ER 10 MG PO TB24
10.0000 mg | ORAL_TABLET | Freq: Every day | ORAL | Status: DC
  Administered 2014-02-19 – 2014-02-22 (×4): 10 mg via ORAL
  Filled 2014-02-19 (×5): qty 1

## 2014-02-19 MED ORDER — HYDROMORPHONE HCL PF 1 MG/ML IJ SOLN
INTRAMUSCULAR | Status: AC
  Filled 2014-02-19: qty 1

## 2014-02-19 MED ORDER — KCL IN DEXTROSE-NACL 10-5-0.45 MEQ/L-%-% IV SOLN
INTRAVENOUS | Status: AC
Start: 2014-02-19 — End: 2014-02-20
  Administered 2014-02-19: 75 mL/h via INTRAVENOUS
  Filled 2014-02-19 (×2): qty 1000

## 2014-02-19 MED ORDER — BUPIVACAINE-EPINEPHRINE (PF) 0.5% -1:200000 IJ SOLN
INTRAMUSCULAR | Status: DC | PRN
  Administered 2014-02-19: 18 mL

## 2014-02-19 MED ORDER — SUCCINYLCHOLINE CHLORIDE 20 MG/ML IJ SOLN
INTRAMUSCULAR | Status: DC | PRN
  Administered 2014-02-19: 100 mg via INTRAVENOUS

## 2014-02-19 MED ORDER — NITROGLYCERIN 0.4 MG SL SUBL
0.4000 mg | SUBLINGUAL_TABLET | SUBLINGUAL | Status: DC | PRN
  Administered 2014-02-19: 0.4 mg via SUBLINGUAL
  Filled 2014-02-19: qty 1

## 2014-02-19 SURGICAL SUPPLY — 47 items
BAG ZIPLOCK 12X15 (MISCELLANEOUS) IMPLANT
CHLORAPREP W/TINT 26ML (MISCELLANEOUS) IMPLANT
CLEANER TIP ELECTROSURG 2X2 (MISCELLANEOUS) ×3 IMPLANT
CLOSURE WOUND 1/2 X4 (GAUZE/BANDAGES/DRESSINGS)
CLOTH 2% CHLOROHEXIDINE 3PK (PERSONAL CARE ITEMS) ×3 IMPLANT
DRAPE MICROSCOPE LEICA (MISCELLANEOUS) ×3 IMPLANT
DRAPE POUCH INSTRU U-SHP 10X18 (DRAPES) ×3 IMPLANT
DRAPE SURG 17X11 SM STRL (DRAPES) ×3 IMPLANT
DRAPE UTILITY XL STRL (DRAPES) ×3 IMPLANT
DRSG ADAPTIC 3X8 NADH LF (GAUZE/BANDAGES/DRESSINGS) ×3 IMPLANT
DRSG AQUACEL AG ADV 3.5X 4 (GAUZE/BANDAGES/DRESSINGS) IMPLANT
DRSG AQUACEL AG ADV 3.5X 6 (GAUZE/BANDAGES/DRESSINGS) IMPLANT
DURAPREP 26ML APPLICATOR (WOUND CARE) ×3 IMPLANT
DURASEAL SPINE SEALANT 3ML (MISCELLANEOUS) IMPLANT
ELECT BLADE TIP CTD 4 INCH (ELECTRODE) IMPLANT
ELECT REM PT RETURN 9FT ADLT (ELECTROSURGICAL) ×3
ELECTRODE REM PT RTRN 9FT ADLT (ELECTROSURGICAL) ×1 IMPLANT
GLOVE BIOGEL PI IND STRL 7.5 (GLOVE) IMPLANT
GLOVE BIOGEL PI INDICATOR 7.5 (GLOVE)
GLOVE SURG SS PI 7.5 STRL IVOR (GLOVE) ×3 IMPLANT
GLOVE SURG SS PI 8.0 STRL IVOR (GLOVE) ×6 IMPLANT
GOWN STRL REUS W/TWL XL LVL3 (GOWN DISPOSABLE) ×6 IMPLANT
IV CATH 14GX2 1/4 (CATHETERS) IMPLANT
KIT BASIN OR (CUSTOM PROCEDURE TRAY) ×3 IMPLANT
KIT POSITIONING SURG ANDREWS (MISCELLANEOUS) ×3 IMPLANT
MANIFOLD NEPTUNE II (INSTRUMENTS) ×3 IMPLANT
NEEDLE SPNL 18GX3.5 QUINCKE PK (NEEDLE) ×6 IMPLANT
PATTIES SURGICAL .5 X.5 (GAUZE/BANDAGES/DRESSINGS) IMPLANT
PATTIES SURGICAL .75X.75 (GAUZE/BANDAGES/DRESSINGS) IMPLANT
PATTIES SURGICAL 1X1 (DISPOSABLE) IMPLANT
SPONGE LAP 4X18 X RAY DECT (DISPOSABLE) ×3 IMPLANT
SPONGE SURGIFOAM ABS GEL 100 (HEMOSTASIS) ×3 IMPLANT
STAPLER VISISTAT (STAPLE) ×3 IMPLANT
STRIP CLOSURE SKIN 1/2X4 (GAUZE/BANDAGES/DRESSINGS) IMPLANT
SUT NURALON 4 0 TR CR/8 (SUTURE) IMPLANT
SUT PROLENE 3 0 PS 2 (SUTURE) IMPLANT
SUT VIC AB 1 CT1 27 (SUTURE)
SUT VIC AB 1 CT1 27XBRD ANTBC (SUTURE) IMPLANT
SUT VIC AB 1-0 CT2 27 (SUTURE) IMPLANT
SUT VIC AB 2-0 CT1 27 (SUTURE)
SUT VIC AB 2-0 CT1 TAPERPNT 27 (SUTURE) IMPLANT
SUT VIC AB 2-0 CT2 27 (SUTURE) IMPLANT
SYRINGE 3CC LL L/F (MISCELLANEOUS) IMPLANT
TOWEL OR 17X26 10 PK STRL BLUE (TOWEL DISPOSABLE) ×3 IMPLANT
TOWEL OR NON WOVEN STRL DISP B (DISPOSABLE) ×3 IMPLANT
TRAY LAMINECTOMY (CUSTOM PROCEDURE TRAY) ×3 IMPLANT
YANKAUER SUCT BULB TIP NO VENT (SUCTIONS) ×3 IMPLANT

## 2014-02-19 NOTE — Discharge Instructions (Signed)
Walk As Tolerated utilizing back precautions.  No bending, twisting, or lifting.  No driving for 2 weeks.   °Aquacel dressing may remain in place for 7 days. May shower with aquacel dressing in place. After 7 days, remove aquacel dressing and place gauze and tape dressing which should be kept clean and dry and changed daily. Do not remove steri-strips if they are present. °See Dr. Izel Hochberg in office in 10 to 14 days. Begin taking aspirin 81mg per day starting 4 days after your surgery if not allergic to aspirin or on another blood thinner. °Walk daily even outside. Use a cane or walker only if necessary. °Avoid sitting on soft sofas. ° °

## 2014-02-19 NOTE — H&P (View-Only) (Signed)
[name] is an 80 y.o. male.   Chief Complaint: back and leg pain HPI: The patient is a 80 year old male who presents today for follow up of their back. The patient is being followed for their low back pain. They are now 4 month(s) out from when symptoms began. Symptoms reported today include: pain and leg pain. The patient states that they are doing well. Current treatment includes: relative rest, activity modification, NSAIDs, pain medications and use of a cane. The following medication has been used for pain control: Norco and ibuprofen. The patient reports their current pain level to be severe. The patient presents today following CT/Myelogram.  The patient reports severe pain radiating down into both legs when he attempts to stand. He is here with his wife.  Past Medical History  Diagnosis Date  . CAD (coronary artery disease) 06/26/2012    PTCA RCA 1995 CABG w. LIMA to LAD, SVG to dx 1, PD and PL March 2000 Dr. Servando Snare for 3VD Cypher stent SVG to RCA  2005 redo CABG 07/2005 with SVG to OM and SVG to PD-PL    . H/O prostate cancer     Prior cryoablation   . Hyperlipidemia 06/26/2012  . S/P CABG (coronary artery bypass graft)   . Villous adenoma of rectum   . Anxiety     Past Surgical History  Procedure Laterality Date  . Coronary artery bypass graft  2000, 2007    LIMA and SVGs to Diag 1 PDA PLR March 2000,  SVG to OM and SVG to PDA-PL 07/2005  . Partial colectomy    . Prostate cryoablation    . Hernia repair    . Nasal septum surgery      No family history on file. Social History:  reports that he has quit smoking. He has never used smokeless tobacco. He reports that he drinks about .5 ounces of alcohol per week. He reports that he does not use illicit drugs.  Allergies:  Allergies  Allergen Reactions  . Fenofibrate   . Gemfibrozil   . Ranexa [Ranolazine]   . Red Yeast Rice [Cholestin]   . Statins   . Zetia [Ezetimibe]      (Not in a hospital admission)  No  results found for this or any previous visit (from the past 48 hour(s)). No results found.  Review of Systems  Constitutional: Negative.   HENT: Negative.   Eyes: Negative.   Respiratory: Negative.   Cardiovascular: Negative.   Gastrointestinal: Negative.   Genitourinary: Negative.   Musculoskeletal: Positive for back pain.  Skin: Negative.   Neurological: Positive for sensory change and focal weakness.  Psychiatric/Behavioral: Negative.     There were no vitals taken for this visit. Physical Exam  Constitutional: He is oriented to person, place, and time. He appears well-developed and well-nourished.  HENT:  Head: Normocephalic and atraumatic.  Eyes: Conjunctivae and EOM are normal. Pupils are equal, round, and reactive to light.  Neck: Normal range of motion. Neck supple.  Cardiovascular: Normal rate and regular rhythm.   Respiratory: Effort normal and breath sounds normal.  GI: Soft. Bowel sounds are normal.  Musculoskeletal:  On exam he walks with a forward flexed gait. Moderate distress. Straight leg raise produces buttock and thigh pain bilaterally. Trace EHL weakness. Limited flexion and extension of the lumbar spine.  Lumbar spine exam reveals no evidence of soft tissue swelling, no evidence of soft tissue swelling or deformity or skin ecchymosis. On palpation there is no  tenderness of the lumbar spine. No flank pain with percussion. The abdomen is soft and nontender. Nontender over the trochanters. No cellulitis or lymphadenopathy.  Motor is 5/5 including tibialis anterior, plantarflexion, quadriceps and hamstrings. The patient is normoreflexic. There is no Babinski or clonus. Sensory exam is intact to light touch. The patient has good distal pulses. No DVT. No pain and normal range of motion without instability of the hips, knees and ankles. Inspection of the cervical spine reveals a normal lordosis without evidence of paraspinous spasms or soft tissue swelling.  Nontender to palpation. Full flexion, full extension, full left and right lateral rotation. Extension combined with lateral flexion does not reproduce pain. Negative impingement sign, negative secondary impingement sign of the shoulders. Negative Tinel's at median and ulnar nerves at the elbow. Negative carpal compression test at the wrists. Motor of the upper extremities is 5/5 including biceps, triceps, brachioradialis,wrist flexion, wrist extension, finger flexion, finger extension. Reflexes are normoreflexic. Sensory exam is intact to light touch. There is no Hoffmann sign. Nontender over the thoracic spine.  Neurological: He is alert and oriented to person, place, and time. He has normal reflexes.  Skin: Skin is warm and dry.  Psychiatric: He has a normal mood and affect.    Myelogram demonstrates severe multifactorial stenosis at 4-5, central and lateral recess. Compression of both 5 roots. Facet arthrosis at 5-1 and at 3-4, moderate stenosis at 3-4.  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  We had an extensive discussion concerning current pathology, relevant anatomy and treatment options.  Consider lumbar decompression at 4-5 and possibly at 3-4.  I had an extensive discussion of the risks and benefits of lumbar decompression with the patient including bleeding, infection, damage to neurovascular structures, epidural fibrosis, CSF leakage requiring repair. We also discussed increase in pain, adjacent segment disease, recurrent disc herniation, need for future surgery including repeat decompression and/or fusion. We also discussed risks of postoperative hematoma, paralysis, anesthetic complications including DVT, PE, death, cardiopulmonary dysfunction. In addition, the  perioperative and postoperative courses were discussed in detail including the rehabilitative time and return to functional activity and work. I provided the patient with an illustrated handout and utilized the appropriate surgical models.  This would require preoperative clearance. It is appropriate to consider the perioperative risks. We appreciate the kind input by Dr. Shon Baton and Dr. Tollie Eth for that purpose. We spent a considerable time discussing that as well as with his wife. Currently he is unable to live with his symptoms as they currently exist. He has had no help from the epidurals, activity modifications, or analgesics. Fortunately no change in bowel or bladder function. Discussed the perioperative course, two hours in duration, overnight stay, immediate ambulation and physical therapy. Anticipated blood loss is minimal. They would like to proceed.  I called the patient at home and discussed the conversation with Dr. Wynonia Lawman and also indicated I have confirmed with Dr. Gladstone Lighter and he is in agreement. I would not proceed with operation on the spine in the presence of an anticoagulant due to unacceptable risk of postoperative hematoma and the result in paralysis. I spoke with the patient about that indicating he would have to be off his aspirin at least a week before and his Plavix five days before. He will speak directly to Dr. Tollie Eth his cardiologist further in terms of  the risks associated with that. I have indicated that it is balancing risks versus benefits, and again I asked him whether he is disabled by his symptoms and he indicates that his symptoms are, therefore he will have that discussion with Dr. Wynonia Lawman.  Plan microlumbar decompression L3-4, L4-5  Clayton Jarmon M. PA-C for Dr. Tonita Cong 01/31/2014, 10:13 PM

## 2014-02-19 NOTE — Transfer of Care (Signed)
Immediate Anesthesia Transfer of Care Note  Patient: [name]  Procedure(s) Performed: Procedure(s) (LRB): MICRO LUMBAR DECOMPRESSION L4-5/L3-4 (N/A)  Patient Location: PACU  Anesthesia Type: General  Level of Consciousness: sedated, patient cooperative and responds to stimulation  Airway & Oxygen Therapy: Patient Spontanous Breathing and Patient connected to face mask oxgen  Post-op Assessment: Report given to PACU RN and Post -op Vital signs reviewed and stable  Post vital signs: Reviewed and stable  Complications: No apparent anesthesia complications

## 2014-02-19 NOTE — Anesthesia Preprocedure Evaluation (Addendum)
Anesthesia Evaluation  Patient identified by MRN, date of birth, ID band Patient awake    Reviewed: Allergy & Precautions, H&P , NPO status , Patient's Chart, lab work & pertinent test results, reviewed documented beta blocker date and time   Airway Mallampati: II TM Distance: >3 FB Neck ROM: full    Dental no notable dental hx. (+) Teeth Intact, Dental Advisory Given   Pulmonary neg pulmonary ROS, former smoker,  breath sounds clear to auscultation  Pulmonary exam normal       Cardiovascular + CAD, + Cardiac Stents and + CABG Rhythm:regular Rate:Normal  RBBB.  Surgeon says he must be off aspirin for the surgery.  Patient understands increased risk associated with this.   Neuro/Psych negative neurological ROS  negative psych ROS   GI/Hepatic negative GI ROS, Neg liver ROS,   Endo/Other  negative endocrine ROS  Renal/GU negative Renal ROS  negative genitourinary   Musculoskeletal   Abdominal   Peds  Hematology negative hematology ROS (+)   Anesthesia Other Findings   Reproductive/Obstetrics negative OB ROS                        Anesthesia Physical Anesthesia Plan  ASA: III  Anesthesia Plan: General   Post-op Pain Management:    Induction: Intravenous  Airway Management Planned: Oral ETT  Additional Equipment:   Intra-op Plan:   Post-operative Plan: Extubation in OR  Informed Consent: I have reviewed the patients History and Physical, chart, labs and discussed the procedure including the risks, benefits and alternatives for the proposed anesthesia with the patient or authorized representative who has indicated his/her understanding and acceptance.   Dental Advisory Given  Plan Discussed with: CRNA and Surgeon  Anesthesia Plan Comments:         Anesthesia Quick Evaluation

## 2014-02-19 NOTE — Interval H&P Note (Signed)
History and Physical Interval Note:  02/19/2014 9:54 AM  [name]  has presented today for surgery, with the diagnosis of STENOSIS L4-5/L3-4  The various methods of treatment have been discussed with the patient and family. After consideration of risks, benefits and other options for treatment, the patient has consented to  Procedure(s): MICRO LUMBAR DECOMPRESSION L4-5/L3-4 (N/A) as a surgical intervention .  The patient's history has been reviewed, patient examined, no change in status, stable for surgery.  I have reviewed the patient's chart and labs.  Questions were answered to the patient's satisfaction.     Joci Dress C

## 2014-02-19 NOTE — Brief Op Note (Signed)
02/19/2014  11:45 AM  PATIENT:  Precious Reel  80 y.o. male  PRE-OPERATIVE DIAGNOSIS:  STENOSIS L4-5/L3-4  POST-OPERATIVE DIAGNOSIS:  STENOSIS L4-5/L3-4  PROCEDURE:  Procedure(s): MICRO LUMBAR DECOMPRESSION L4-5/L3-4 (N/A)  SURGEON:  Surgeon(s) and Role:    * Johnn Hai, MD - Primary  PHYSICIAN ASSISTANT:   ASSISTANTSMancel Bale   ANESTHESIA:   general  EBL:  Total I/O In: 1000 [I.V.:1000] Out: 100 [Blood:100]  BLOOD ADMINISTERED:none  DRAINS: none   LOCAL MEDICATIONS USED:  MARCAINE     SPECIMEN:  No Specimen  DISPOSITION OF SPECIMEN:  N/A  COUNTS:  YES  TOURNIQUET:  * No tourniquets in log *  DICTATION: .Other Dictation: Dictation Number [id number]  PLAN OF CARE: Admit to inpatient   PATIENT DISPOSITION:  PACU - hemodynamically stable.   Delay start of Pharmacological VTE agent (>24hrs) due to surgical blood loss or risk of bleeding: yes

## 2014-02-19 NOTE — Anesthesia Postprocedure Evaluation (Signed)
  Anesthesia Post-op Note  Patient: [name]  Procedure(s) Performed: Procedure(s) (LRB): MICRO LUMBAR DECOMPRESSION L4-5/L3-4 (N/A)  Patient Location: PACU  Anesthesia Type: General  Level of Consciousness: awake and alert   Airway and Oxygen Therapy: Patient Spontanous Breathing  Post-op Pain: mild  Post-op Assessment: Post-op Vital signs reviewed, Patient's Cardiovascular Status Stable, Respiratory Function Stable, Patent Airway and No signs of Nausea or vomiting  Last Vitals:  Filed Vitals:   02/19/14 1300  BP: 114/55  Pulse: 60  Temp: 36.5 C  Resp: 17    Post-op Vital Signs: stable   Complications: No apparent anesthesia complications

## 2014-02-20 ENCOUNTER — Encounter (HOSPITAL_COMMUNITY): Payer: Self-pay | Admitting: Specialist

## 2014-02-20 LAB — CBC
HCT: 33.4 % — ABNORMAL LOW (ref 39.0–52.0)
HEMOGLOBIN: 11.2 g/dL — AB (ref 13.0–17.0)
MCH: 28.4 pg (ref 26.0–34.0)
MCHC: 33.5 g/dL (ref 30.0–36.0)
MCV: 84.6 fL (ref 78.0–100.0)
PLATELETS: 106 10*3/uL — AB (ref 150–400)
RBC: 3.95 MIL/uL — AB (ref 4.22–5.81)
RDW: 14.3 % (ref 11.5–15.5)
WBC: 19 10*3/uL — AB (ref 4.0–10.5)

## 2014-02-20 LAB — BASIC METABOLIC PANEL
ANION GAP: 13 (ref 5–15)
BUN: 11 mg/dL (ref 6–23)
CALCIUM: 8.3 mg/dL — AB (ref 8.4–10.5)
CHLORIDE: 105 meq/L (ref 96–112)
CO2: 23 meq/L (ref 19–32)
Creatinine, Ser: 1.15 mg/dL (ref 0.50–1.35)
GFR calc Af Amer: 65 mL/min — ABNORMAL LOW (ref >90)
GFR calc non Af Amer: 56 mL/min — ABNORMAL LOW (ref >90)
GLUCOSE: 185 mg/dL — AB (ref 70–99)
Potassium: 4.2 mEq/L (ref 3.7–5.3)
Sodium: 141 mEq/L (ref 137–147)

## 2014-02-20 MED ORDER — TRAMADOL HCL 50 MG PO TABS
50.0000 mg | ORAL_TABLET | Freq: Four times a day (QID) | ORAL | Status: DC | PRN
  Administered 2014-02-20 – 2014-02-22 (×5): 50 mg via ORAL
  Filled 2014-02-20 (×5): qty 1

## 2014-02-20 MED ORDER — SODIUM CHLORIDE 0.9 % IV SOLN
INTRAVENOUS | Status: DC
  Administered 2014-02-20 – 2014-02-21 (×2): via INTRAVENOUS

## 2014-02-20 MED ORDER — TRAMADOL HCL 50 MG PO TABS
50.0000 mg | ORAL_TABLET | Freq: Four times a day (QID) | ORAL | Status: DC | PRN
  Administered 2014-02-20: 100 mg via ORAL
  Filled 2014-02-20: qty 2

## 2014-02-20 NOTE — Plan of Care (Signed)
Problem: Consults Goal: Diagnosis - Spinal Surgery Outcome: Completed/Met Date Met:  02/20/14 Microdiscectomy with Lumbar decompression L3-L4, L4-L5

## 2014-02-20 NOTE — Evaluation (Signed)
Physical Therapy Evaluation Patient Details Name: [name] MRN: [id number] DOB: January 03, 1927 Today's Date: 02/20/2014   History of Present Illness  Pt is an 80 year old male s/p microlumbar decompression, L3-L4 and L4-L5 due to spinal stenosis.  Clinical Impression  Patient is s/p lumbar decompression surgery resulting in the deficits listed below (see PT Problem List).  Patient will benefit from skilled PT to increase their independence and safety with mobility (while adhering to their precautions) to allow discharge home with spouse.     Follow Up Recommendations No PT follow up    Equipment Recommendations  None recommended by PT    Recommendations for Other Services       Precautions / Restrictions Precautions Precautions: Fall;Back Precaution Comments: reviewed back precautions      Mobility  Bed Mobility Overal bed mobility: Needs Assistance Bed Mobility: Supine to Sit;Sit to Supine     Supine to sit: Min guard Sit to supine: Min guard   General bed mobility comments: verbal cues for log roll technique  Transfers Overall transfer level: Needs assistance Equipment used: Rolling walker (2 wheeled) Transfers: Sit to/from Stand Sit to Stand: Min guard         General transfer comment: verbal cues for back precautions and hand placement  Ambulation/Gait Ambulation/Gait assistance: Min guard Ambulation Distance (Feet): 360 Feet Assistive device: Rolling walker (2 wheeled) Gait Pattern/deviations: Step-through pattern;Trunk flexed     General Gait Details: occasional cues for RW distance and no twisting  Stairs            Wheelchair Mobility    Modified Rankin (Stroke Patients Only)       Balance                                             Pertinent Vitals/Pain Pain Assessment: 0-10 Pain Score: 8  Pain Location: back Pain Descriptors / Indicators: Sore;Aching Pain Intervention(s): Patient requesting pain meds-RN  notified;Repositioned;Monitored during session;Limited activity within patient's tolerance    Home Living Family/patient expects to be discharged to:: Private residence Living Arrangements: Spouse/significant other;Children   Type of Home: House Home Access: Stairs to enter Entrance Stairs-Rails: Right Entrance Stairs-Number of Steps: 3 Home Layout: Bed/bath upstairs;Two level Home Equipment: Walker - 2 wheels      Prior Function Level of Independence: Independent               Hand Dominance        Extremity/Trunk Assessment               Lower Extremity Assessment: Overall WFL for tasks assessed         Communication   Communication: No difficulties  Cognition Arousal/Alertness: Awake/alert Behavior During Therapy: WFL for tasks assessed/performed Overall Cognitive Status: Within Functional Limits for tasks assessed                      General Comments      Exercises        Assessment/Plan    PT Assessment Patient needs continued PT services  PT Diagnosis Difficulty walking;Acute pain   PT Problem List Decreased strength;Decreased activity tolerance;Decreased mobility;Pain;Decreased knowledge of use of DME;Decreased knowledge of precautions  PT Treatment Interventions Stair training;Gait training;DME instruction;Patient/family education;Therapeutic activities;Therapeutic exercise   PT Goals (Current goals can be found in the Care Plan section) Acute Rehab PT Goals  PT Goal Formulation: With patient Time For Goal Achievement: 02/27/14 Potential to Achieve Goals: Good    Frequency Min 5X/week   Barriers to discharge        Co-evaluation               End of Session   Activity Tolerance: Patient tolerated treatment well Patient left: in bed;with call bell/phone within reach;with family/visitor present Nurse Communication: Patient requests pain meds         Time: 0349-1791 PT Time Calculation (min): 19  min   Charges:   PT Evaluation $Initial PT Evaluation Tier I: 1 Procedure PT Treatments $Gait Training: 8-22 mins   PT G Codes:          [name],KATHrine E 02/20/2014, 12:44 PM Carmelia Bake, PT, DPT 02/20/2014 Pager: [phone]

## 2014-02-20 NOTE — Progress Notes (Addendum)
G-Codes for PT evaluation   02/20/14 1243  PT Time Calculation  PT Start Time 1104  PT Stop Time 1123  PT Time Calculation (min) 19 min  PT G-Codes **NOT FOR INPATIENT CLASS**  Functional Assessment Tool Used clinical judgement  Functional Limitation Mobility: Walking and moving around  Mobility: Walking and Moving Around Current Status (R6789) CI  Mobility: Walking and Moving Around Goal Status (F8101) CH  PT General Charges  $$ ACUTE PT VISIT 1 Procedure  PT Evaluation  $Initial PT Evaluation Tier I 1 Procedure  PT Treatments  $Gait Training 8-22 mins   Carmelia Bake, PT, DPT 02/20/2014 Pager: [phone]

## 2014-02-20 NOTE — Progress Notes (Signed)
Subjective: 1 Day Post-Op Procedure(s) (LRB): MICRO LUMBAR DECOMPRESSION L4-5/L3-4 (N/A) Patient reports pain as 4 on 0-10 scale.    Objective: Vital signs in last 24 hours: Temp:  [97.3 F (36.3 C)-98.5 F (36.9 C)] 98.5 F (36.9 C) (08/20 0641) Pulse Rate:  [56-98] 82 (08/20 0641) Resp:  [9-18] 17 (08/20 0641) BP: (105-131)/(51-70) 116/52 mmHg (08/20 0641) SpO2:  [87 %-100 %] 93 % (08/20 0641) Weight:  [77.111 kg (170 lb)] 77.111 kg (170 lb) (08/19 1325)  Intake/Output from previous day: 08/19 0701 - 08/20 0700 In: 3226.3 [P.O.:720; I.V.:2451.3; IV Piggyback:55] Out: 1194 [Urine:1625; Blood:100] Intake/Output this shift:     Recent Labs  02/20/14 0435  HGB 11.2*    Recent Labs  02/20/14 0435  WBC 19.0*  RBC 3.95*  HCT 33.4*  PLT 106*    Recent Labs  02/20/14 0435  NA 141  K 4.2  CL 105  CO2 23  BUN 11  CREATININE 1.15  GLUCOSE 185*  CALCIUM 8.3*   No results found for this basename: LABPT, INR,  in the last 72 hours  Neurologically intact Sensation intact distally Intact pulses distally Compartment soft  Assessment/Plan: 1 Day Post-Op Procedure(s) (LRB): MICRO LUMBAR DECOMPRESSION L4-5/L3-4 (N/A) Advance diet Up with therapy  Devlyn Retter C 02/20/2014, 7:22 AM

## 2014-02-20 NOTE — Evaluation (Signed)
Occupational Therapy Evaluation Patient Details Name: [name] MRN: [id number] DOB: Jul 07, 1926 Today's Date: 02/20/2014    History of Present Illness Pt is an 80 year old male s/p microlumbar decompression, L3-L4 and L4-L5 due to spinal stenosis.   Clinical Impression   Pt up to [facility] with walker and needing constant reinforcement of back precautions. Not sure if pt with some confusion from meds but pt needing mod to max cues to slow down for safety and for correct technique to adhere to precautions. Wife present. He is currently limited by ongoing pain which was an 8/10 at rest when OT arrived and down to a 5 with rest after session. Will follow for acute OT to progress safety and independence with self care tasks.    Follow Up Recommendations  No OT follow up;Supervision/Assistance - 24 hour    Equipment Recommendations  3 in 1 bedside comode    Recommendations for Other Services       Precautions / Restrictions Precautions Precautions: Fall;Back Precaution Comments: Issued back care handout and reviewed all precautions several times with pt and wife.      Mobility Bed Mobility Overal bed mobility: Needs Assistance Bed Mobility: Rolling;Sidelying to Sit;Sit to Sidelying Rolling: Min guard Sidelying to sit: Min guard   Sit to sidelying: Min guard General bed mobility comments: verbal cues for log roll technique  Transfers Overall transfer level: Needs assistance Equipment used: Rolling walker (2 wheeled) Transfers: Sit to/from Stand Sit to Stand: Min guard         General transfer comment: verbal cues for back precautions and hand placement. Several trials of practice and needed constant cues.    Balance                                            ADL Overall ADL's : Needs assistance/impaired Eating/Feeding: Independent;Sitting   Grooming: Wash/dry hands;Set up;Supervision/safety;Sitting   Upper Body Bathing: Supervision/ safety;Set  up;Sitting   Lower Body Bathing: Moderate assistance;Sit to/from stand   Upper Body Dressing : Minimal assistance;Sitting   Lower Body Dressing: Moderate assistance;Sit to/from stand   Toilet Transfer: Min Geophysical data processor Details (indicate cue type and reason): mod verbal cues for back precautions Toileting- Clothing Manipulation and Hygiene: Moderate assistance;Sit to/from stand Toileting - Clothing Manipulation Details (indicate cue type and reason): to not break back precautions.       General ADL Comments: Pt stating he hasnt had relief from pain since surgery and is reporting 8/10 pain at rest when OT arrived. Pt can be impulsive at times and requiring mod to max verbal cues for back precautions. Explained how pt needs to stand to adhere to back precautions several times but pt still arching at times and standing quickly despite therapist recommendation to go slower and follow precautions. Not sure if pt frustrated with pain level and therefore not fully complying with precautions or if some confusion from meds present. Nursing aware. Educated on toilet aid as pt is twisting to reach to periarea and despite cues to not twist,  pt still stating "I dont need a toilet aid" and proceeded to twist to reach periarea again. Explained again pt will benefit from toilet aid so he does NOT twist to perform hygiene. Wife present and trying to advise pt to follow back precautions also. Pt able to cross R LE over L in supine for LB self  care but not able to cross L LE over R due to buttock and thigh pain.  Wife states she will assist with LB self care. He will benefit from a 3in1 for arm supports.      Vision                     Perception     Praxis      Pertinent Vitals/Pain Pain Assessment: 0-10 Pain Score: 8  Pain Location: back Pain Descriptors / Indicators: Sore;Aching Pain Intervention(s): Patient requesting pain meds-RN notified;Repositioned;Monitored  during session;Limited activity within patient's tolerance     Hand Dominance     Extremity/Trunk Assessment Upper Extremity Assessment Upper Extremity Assessment: Overall WFL for tasks assessed          Communication Communication Communication: No difficulties   Cognition Arousal/Alertness: Awake/alert Behavior During Therapy: Impulsive Overall Cognitive Status: Difficult to assess       Memory: Decreased recall of precautions             General Comments       Exercises       Shoulder Instructions      Home Living Family/patient expects to be discharged to:: Private residence Living Arrangements: Spouse/significant other;Children   Type of Home: House Home Access: Stairs to enter Technical brewer of Steps: 3 Entrance Stairs-Rails: Right Home Layout: Bed/bath upstairs;Two level Alternate Level Stairs-Number of Steps: flight to bathroom per pt   Bathroom Shower/Tub: Hospital doctor Toilet: Handicapped height     Home Equipment: Environmental consultant - 2 wheels;Shower seat          Prior Functioning/Environment Level of Independence: Independent             OT Diagnosis: Generalized weakness;Acute pain   OT Problem List: Decreased strength;Decreased knowledge of use of DME or AE;Decreased knowledge of precautions;Pain   OT Treatment/Interventions: Self-care/ADL training;Patient/family education;Therapeutic activities;DME and/or AE instruction    OT Goals(Current goals can be found in the care plan section) Acute Rehab OT Goals Patient Stated Goal: decrease pain OT Goal Formulation: With patient/family Time For Goal Achievement: 02/27/14 Potential to Achieve Goals: Good  OT Frequency: Min 2X/week   Barriers to D/C:            Co-evaluation              End of Session Equipment Utilized During Treatment: Rolling walker  Activity Tolerance: Patient limited by pain Patient left: in bed;with call bell/phone within reach;with  family/visitor present   Time: 1829-9371 OT Time Calculation (min): 28 min Charges:  OT General Charges $OT Visit: 1 Procedure OT Evaluation $Initial OT Evaluation Tier I: 1 Procedure OT Treatments $Self Care/Home Management : 8-22 mins $Therapeutic Activity: 8-22 mins G-Codes:    Jules Schick [phone] 02/20/2014, 1:12 PM

## 2014-02-20 NOTE — Op Note (Signed)
NAMEJURRELL, ROYSTER NO.:  [id number]  MEDICAL RECORD NO.:  [id number]  LOCATION:  [facility]                         FACILITY:  [facility]  PHYSICIAN:  Susa Day, M.D.    DATE OF BIRTH:  August 30, 1926  DATE OF PROCEDURE:  02/19/2014 DATE OF DISCHARGE:                              OPERATIVE REPORT   PREOPERATIVE DIAGNOSIS:  Spinal stenosis, L4-L5 and L3-L4.  POSTOPERATIVE DIAGNOSIS:  Spinal stenosis, L4-L5 and L3-L4.  PROCEDURES PERFORMED: 1. Microlumbar decompression, L3-L4 and L4-L5. 2. Bilateral foraminotomies, L4, L5, L3.  ANESTHESIA:  General.  ASSISTANT:  Nehemiah Massed, P.A.  HISTORY:  An 80 year old with neurogenic claudication secondary to severe spinal stenosis, L4-L5.  He had been refractory to conservative treatment, epidural steroid injections, activity modifications, relegated to a sedentary position.  We discussed limited movement with his symptoms.  He reported he could not live with his symptoms.  We discussed decompression.  He had a fairly significant block on his extension myelogram.  He had difficulty in ambulating.  We discussed the decompression of L4-L5, possibly L3-L4.  Risks and benefits were discussed including bleeding, infection, damage to neurovascular structures, DVT, PE anesthetic complications, etc.  TECHNIQUE:  With the patient in supine position, after the induction of adequate general anesthesia, 2 g Kefzol, placed prone on the [place] frame.  All bony prominences were well padded.  Lumbar region was prepped and draped in usual sterile fashion.  Two 18-gauge spinal needles were utilized, localized at L4-L5 and L3-L4 interspace, confirmed with x-ray.  An incision was made from spinous process of 3 just below 5.  Subcutaneous tissue was dissected.  Electrocautery was utilized to achieve hemostasis.  Paraspinous muscles were infiltrated with 0.25% Marcaine with epinephrine.  Dorsal lumbar fascia was identified and divided in line  with the skin incision.  Paraspinous muscle elevated from lamina of L3-L4 and L4-L5.  McCullough retractor was placed.  Operating microscope was draped, brought onto the surgical field.  Confirmatory radiograph obtained.  I used an Leksell to remove the spinous process of L4 and L5, partial of L3.  We used bone wax and thrombin-soaked Gelfoam and bipolar electrocautery to achieve hemostasis throughout the case.  I performed a hemilaminotomy with caudad edge of L4 with 2 mm Kerrison, extending cephalad through the lamina of L4. There was severe central stenosis.  We then detached ligamentum flavum from the cephalad edge of L5 bilaterally with a straight curette.  I then performed hemi-laminotomies of L5 bilaterally.  We performed foraminotomies of L5.  We decompressed lateral recess to the medial border of the pedicle.  Hypertrophic facet and ligamentum flavum were noted with severe compression of the thecal sac centrally at L4-L5. After decompressing the lateral recess of the medial border of the pedicle, we performed foraminotomies of L4 as well, undercutting the facet joints.  They were hypertrophic.  Bipolar electrocautery was utilized to achieve hemostasis.  No disk herniation was noted.  We extended the decompression by removing the entire arch of L4 and also ligamentum flavum at L3-L4 and decompressing that bilaterally.  He actually had a facet arthropathy and significant lateral recess stenosis at L3-L4 as well.  After full decompression, we obtained  an x-ray with a marker at the upper level of decompression, lower level of decompression, identifying L3-L4 and L4-L5.  Bipolar electrocautery was utilized to achieve strict hemostasis.  No evidence of CSF leakage or active bleeding.  Good restoration of thecal sac, and a neural probe passed freely up the foramens of L4, L5, and L3 bilaterally, performing foraminotomies at L3.  Next, thrombin-soaked Gelfoam was placed in laminotomy  defect, removed the [facility] retractor.  Paraspinous muscles were irrigated and inspected.  No evidence of active bleeding. Put bone wax on the cancellous surfaces.  Repaired the dorsolumbar fascia with 1 Vicryl interrupted figure-of-eight sutures, subcu with 2-0 and skin with staples.  The wound was dressed sterilely.  Placed supine on the hospital bed, extubated without difficulty, and transported to the recovery room in satisfactory condition.  The patient tolerated the procedure well.  No complications.  BLOOD LOSS:  75 mL.     Susa Day, M.D.     Geralynn Rile  D:  02/19/2014  T:  02/19/2014  Job:  [id number]

## 2014-02-20 NOTE — Progress Notes (Signed)
Pt becomes very irritable when RN or NT tries to assist him to the bathroom. Swats at staff and tells them he can do it himself. Refuses to use the urinal unless staff shuts bathroom door. RN attempted to educate pt and wife of safety concerns to no avail. Will continue to use the chair and bed alarms to promote safety.

## 2014-02-21 DIAGNOSIS — M5137 Other intervertebral disc degeneration, lumbosacral region: Secondary | ICD-10-CM | POA: Diagnosis present

## 2014-02-21 DIAGNOSIS — M545 Low back pain, unspecified: Secondary | ICD-10-CM | POA: Diagnosis present

## 2014-02-21 DIAGNOSIS — Z87891 Personal history of nicotine dependence: Secondary | ICD-10-CM | POA: Diagnosis not present

## 2014-02-21 DIAGNOSIS — I4510 Unspecified right bundle-branch block: Secondary | ICD-10-CM | POA: Diagnosis present

## 2014-02-21 DIAGNOSIS — Z951 Presence of aortocoronary bypass graft: Secondary | ICD-10-CM | POA: Diagnosis not present

## 2014-02-21 DIAGNOSIS — Z9861 Coronary angioplasty status: Secondary | ICD-10-CM | POA: Diagnosis not present

## 2014-02-21 DIAGNOSIS — I2510 Atherosclerotic heart disease of native coronary artery without angina pectoris: Secondary | ICD-10-CM | POA: Diagnosis present

## 2014-02-21 DIAGNOSIS — M48062 Spinal stenosis, lumbar region with neurogenic claudication: Secondary | ICD-10-CM | POA: Diagnosis present

## 2014-02-21 LAB — BASIC METABOLIC PANEL
Anion gap: 10 (ref 5–15)
BUN: 12 mg/dL (ref 6–23)
CHLORIDE: 106 meq/L (ref 96–112)
CO2: 24 mEq/L (ref 19–32)
Calcium: 8 mg/dL — ABNORMAL LOW (ref 8.4–10.5)
Creatinine, Ser: 1.08 mg/dL (ref 0.50–1.35)
GFR calc non Af Amer: 61 mL/min — ABNORMAL LOW (ref >90)
GFR, EST AFRICAN AMERICAN: 70 mL/min — AB (ref >90)
Glucose, Bld: 156 mg/dL — ABNORMAL HIGH (ref 70–99)
Potassium: 3.7 mEq/L (ref 3.7–5.3)
Sodium: 140 mEq/L (ref 137–147)

## 2014-02-21 LAB — CBC
HEMATOCRIT: 30.3 % — AB (ref 39.0–52.0)
HEMOGLOBIN: 10.1 g/dL — AB (ref 13.0–17.0)
MCH: 28.7 pg (ref 26.0–34.0)
MCHC: 33.3 g/dL (ref 30.0–36.0)
MCV: 86.1 fL (ref 78.0–100.0)
Platelets: 80 10*3/uL — ABNORMAL LOW (ref 150–400)
RBC: 3.52 MIL/uL — ABNORMAL LOW (ref 4.22–5.81)
RDW: 14.7 % (ref 11.5–15.5)
WBC: 11 10*3/uL — AB (ref 4.0–10.5)

## 2014-02-21 NOTE — Progress Notes (Signed)
[facility] is providing the following services: Patient declined rw and commode  If patient discharges after hours, please call [phone].   Linward Headland 02/21/2014, 4:05 PM

## 2014-02-21 NOTE — Care Management Note (Signed)
    Page 1 of 1   02/21/2014     2:35:33 PM CARE MANAGEMENT NOTE 02/21/2014  Patient:  [name], [name]   Account Number:  [id number]  Date Initiated:  02/21/2014  Documentation initiated by:  [facility], [facility]  Subjective/Objective Assessment:   80 Y/O M ADMITTED W/SPINAL STENOSIS.     Action/Plan:   FROM HOME.   Anticipated DC Date:  02/22/2014   Anticipated DC Plan:  [place]  CM consult      Choice offered to / List presented to:             Status of service:  In process, will continue to follow Medicare Important Message given?   (If response is "NO", the following Medicare IM given date fields will be blank) Date Medicare IM given:   Medicare IM given by:   Date Additional Medicare IM given:   Additional Medicare IM given by:    Discharge Disposition:    Per UR Regulation:  Reviewed for med. necessity/level of care/duration of stay  If discussed at Long Length of Stay Meetings, dates discussed:    Comments:  02/21/14 Natalie Mceuen RN,BSN NCM [phone] PT/OT-NO F/U.NEED 3N1-PLEASE PUT IN ORDER FOR 3N1.SEE CSW NOTE.NO FURTHER D/C NEEDS.

## 2014-02-21 NOTE — Progress Notes (Signed)
Subjective: 2 Days Post-Op Procedure(s) (LRB): MICRO LUMBAR DECOMPRESSION L4-5/L3-4 (N/A) Patient reports pain as 4 on 0-10 scale.   Alert oriented this AM much better. Voided 250 on own after fluid bolus Objective: Vital signs in last 24 hours: Temp:  [98.1 F (36.7 C)-99.2 F (37.3 C)] 99.2 F (37.3 C) (08/21 0531) Pulse Rate:  [71-105] 105 (08/21 0531) Resp:  [18] 18 (08/21 0531) BP: (98-124)/(46-56) 124/46 mmHg (08/21 0531) SpO2:  [93 %-97 %] 94 % (08/21 0531)  Intake/Output from previous day: 08/20 0701 - 08/21 0700 In: 1661.7 [P.O.:820; I.V.:841.7] Out: 550 [Urine:550] Intake/Output this shift:     Recent Labs  02/20/14 0435 02/21/14 0520  HGB 11.2* 10.1*    Recent Labs  02/20/14 0435 02/21/14 0520  WBC 19.0* 11.0*  RBC 3.95* 3.52*  HCT 33.4* 30.3*  PLT 106* 80*    Recent Labs  02/20/14 0435 02/21/14 0520  NA 141 140  K 4.2 3.7  CL 105 106  CO2 23 24  BUN 11 12  CREATININE 1.15 1.08  GLUCOSE 185* 156*  CALCIUM 8.3* 8.0*   No results found for this basename: LABPT, INR,  in the last 72 hours  Neurologically intact Sensation intact distally Dorsiflexion/Plantar flexion intact Incision: dressing C/D/I Wound clean no drainage. No numbness   Assessment/Plan: 2 Days Post-Op Procedure(s) (LRB): MICRO LUMBAR DECOMPRESSION L4-5/L3-4 (N/A) Advance diet Up with therapy D/C IV fluids Plan for discharge tomorrow plts to 80 no bleeding. check tomorrow. UO much better Cr good. Hold IVF follow UO. OOB may need SNF will discuss with family. Not confused this AM much better. Continue monitor less pain meds.  Gluc 156. Decreased WBC. No signs of infection. O2 sat good [name] C 02/21/2014, 7:20 AM

## 2014-02-21 NOTE — Progress Notes (Signed)
02/20/14 1312  OT G-codes **NOT FOR INPATIENT CLASS**  Functional Assessment Tool Used clincal judgement  Functional Limitation Self care  Self Care Current Status [phone]) CJ  Self Care Goal Status (U0233) CI  late entry for 02/20/14 Pauline Aus OTR/L [phone] 02/21/2014

## 2014-02-21 NOTE — Progress Notes (Signed)
Physical Therapy Treatment Patient Details Name: [name] MRN: [id number] DOB: Dec 28, 1926 Today's Date: 2014-03-10    History of Present Illness Pt is an 80 year old male s/p microlumbar decompression, L3-L4 and L4-L5 due to spinal stenosis.    PT Comments    Pt mobilizing better today requiring no physical assist only occasional verbal cues for safety and precautions.  Pt and spouse agreeable for d/c home so ambulated in hallway and practiced steps.  Follow Up Recommendations  No PT follow up     Equipment Recommendations  None recommended by PT    Recommendations for Other Services       Precautions / Restrictions Precautions Precautions: Fall;Back Precaution Comments: reviewed back precautions    Mobility  Bed Mobility Overal bed mobility: Needs Assistance Bed Mobility: Rolling;Sidelying to Sit;Sit to Sidelying Rolling: Min guard Sidelying to sit: Min guard     Sit to sidelying: Min guard General bed mobility comments: verbal cues for log roll technique  Transfers Overall transfer level: Needs assistance Equipment used: Rolling walker (2 wheeled) Transfers: Sit to/from Stand Sit to Stand: Min guard         General transfer comment: continues to pull up on RW however good with back precautions  Ambulation/Gait Ambulation/Gait assistance: Min guard Ambulation Distance (Feet): 200 Feet Assistive device: Rolling walker (2 wheeled) Gait Pattern/deviations: Step-through pattern;Decreased stride length     General Gait Details: occasional cues for RW distance and no twisting   Stairs Stairs: Yes Stairs assistance: Min guard Stair Management: Step to pattern;Forwards;One rail Left Number of Stairs: 2 General stair comments: verbal cues for technique, safety  Wheelchair Mobility    Modified Rankin (Stroke Patients Only)       Balance                                    Cognition Arousal/Alertness: Awake/alert Behavior During  Therapy: WFL for tasks assessed/performed Overall Cognitive Status: Within Functional Limits for tasks assessed                      Exercises      General Comments        Pertinent Vitals/Pain Pain Assessment: 0-10 Pain Score: 7  Pain Location: low back, R and L buttock Pain Descriptors / Indicators: Sore;Sharp;Shooting Pain Intervention(s): Repositioned;Limited activity within patient's tolerance    Home Living                      Prior Function            PT Goals (current goals can now be found in the care plan section) Progress towards PT goals: Progressing toward goals    Frequency  Min 5X/week    PT Plan Current plan remains appropriate    Co-evaluation             End of Session   Activity Tolerance: Patient tolerated treatment well Patient left: in bed;with call bell/phone within reach;with family/visitor present     Time: 1157-2620 PT Time Calculation (min): 22 min  Charges:  $Gait Training: 8-22 mins                    G Codes:      Lanesha Azzaro,KATHrine E 2014/03/10, 3:08 PM Carmelia Bake, PT, DPT 2014/03/10 Pager: [phone]

## 2014-02-21 NOTE — Progress Notes (Signed)
Occupational Therapy Treatment Patient Details Name: LEWI DROST MRN: [id number] DOB: 23-May-1927 Today's Date: 02/21/2014    History of present illness Pt is an 80 year old male s/p microlumbar decompression, L3-L4 and L4-L5 due to spinal stenosis.   OT comments  Pt was cognitively better today but pain still an issue. Pt reports 10/10 at rest and 8/10 with mobility. Wife present. No additional clothing items present to practice dressing but reviewed safety with dressing. Family supposed to bring more clothing to practice. Will continue to follow.   Follow Up Recommendations  No OT follow up;Supervision/Assistance - 24 hour    Equipment Recommendations  3 in 1 bedside comode    Recommendations for Other Services      Precautions / Restrictions Precautions Precautions: Fall;Back Precaution Comments: Reviewed all precautions with pt. he couldnt state any on his own.       Mobility Bed Mobility                  Transfers Overall transfer level: Needs assistance Equipment used: Rolling walker (2 wheeled) Transfers: Sit to/from Stand Sit to Stand: Min guard         General transfer comment: verbal cues for hand placement. Tends to want to pull up on walker. Did better with more practice.    Balance                                   ADL                           Toilet Transfer: Min guard;Ambulation;BSC;RW             General ADL Comments: Pt states he feels some better in standing than sitting. He states pain is right in L buttock/posterior thigh. 10/10 when OT arrived and 8/10 with mobility. Discussed changing positions frequently and not sitting or lying long periods of time. Pt did much better with transitions for toilet transfer following his precautions He was also cognitively better and  following recommendations today..       Vision                     Perception     Praxis      Cognition   Behavior During  Therapy: WFL for tasks assessed/performed Overall Cognitive Status: Within Functional Limits for tasks assessed       Memory:  (followed back precautions better but wasnt able to state them initially on his own)               Extremity/Trunk Assessment               Exercises     Shoulder Instructions       General Comments      Pertinent Vitals/ Pain       Pain Assessment: 0-10 Pain Score: 10-Worst pain ever (down to 8 with mobility) Pain Location: low back, L buttock and thigh Pain Descriptors / Indicators: Constant Pain Intervention(s): Repositioned  Home Living                                          Prior Functioning/Environment              Frequency Min 2X/week  Progress Toward Goals  OT Goals(current goals can now be found in the care plan section)  Progress towards OT goals: Progressing toward goals     Plan Discharge plan remains appropriate    Co-evaluation                 End of Session Equipment Utilized During Treatment: Rolling walker   Activity Tolerance Patient limited by pain   Patient Left in chair;with call bell/phone within reach;with family/visitor present   Nurse Communication          Time: 1423-9532 OT Time Calculation (min): 20 min  Charges: OT General Charges $OT Visit: 1 Procedure OT Treatments $Therapeutic Activity: 8-22 mins  Jules Schick [phone] 02/21/2014, 9:29 AM

## 2014-02-21 NOTE — Progress Notes (Addendum)
CSW consulted for possible SNF placement. PN reviewed. PT / OT are not recommending follow up therapy. Pt's insurance will not cover SNF placement when therapy is not recommended. CSW can assist with pvt pay placement if pt is interested in this.  Werner Lean LCSW [phone]  [phone]   CSW met with pt / spouse to assist with d/c planning. PT / OT notes and recommendations reviewed. Pt /            spouse  feel comfortable discharging home. Spouse will be available to supervise and assist 24 / 7. Encouraged spouse to contact MD if she is having difficulty managing pt's care at home. Spouse also has CSW cell # for further assistance if needed. PT will work with pt again this afternoon and will contact CSW if recommendations change.  Werner Lean LCSW [phone]

## 2014-02-22 LAB — CBC
HEMATOCRIT: 28.6 % — AB (ref 39.0–52.0)
HEMOGLOBIN: 9.6 g/dL — AB (ref 13.0–17.0)
MCH: 28.8 pg (ref 26.0–34.0)
MCHC: 33.6 g/dL (ref 30.0–36.0)
MCV: 85.9 fL (ref 78.0–100.0)
Platelets: 70 10*3/uL — ABNORMAL LOW (ref 150–400)
RBC: 3.33 MIL/uL — AB (ref 4.22–5.81)
RDW: 14.6 % (ref 11.5–15.5)
WBC: 9.7 10*3/uL (ref 4.0–10.5)

## 2014-02-22 LAB — BASIC METABOLIC PANEL
Anion gap: 10 (ref 5–15)
BUN: 10 mg/dL (ref 6–23)
CALCIUM: 8.4 mg/dL (ref 8.4–10.5)
CO2: 25 meq/L (ref 19–32)
Chloride: 108 mEq/L (ref 96–112)
Creatinine, Ser: 0.9 mg/dL (ref 0.50–1.35)
GFR calc Af Amer: 87 mL/min — ABNORMAL LOW (ref >90)
GFR, EST NON AFRICAN AMERICAN: 75 mL/min — AB (ref >90)
GLUCOSE: 129 mg/dL — AB (ref 70–99)
Potassium: 3.6 mEq/L — ABNORMAL LOW (ref 3.7–5.3)
SODIUM: 143 meq/L (ref 137–147)

## 2014-02-22 MED ORDER — POLYETHYLENE GLYCOL 3350 17 G PO PACK
17.0000 g | PACK | Freq: Every day | ORAL | Status: DC
  Administered 2014-02-22 – 2014-02-23 (×2): 17 g via ORAL

## 2014-02-22 NOTE — Progress Notes (Signed)
Occupational Therapy Treatment Patient Details Name: [name] MRN: [id number] DOB: September 15, 1926 Today's Date: 02/22/2014    History of present illness Pt is an 80 year old male s/p microlumbar decompression, L3-L4 and L4-L5 due to spinal stenosis.   OT comments  Needed max encouragement! And increased time.   Follow Up Recommendations  Home health OT;Supervision/Assistance - 24 hour    Equipment Recommendations  3 in 1 bedside comode       Precautions / Restrictions Precautions Precautions: Fall;Back Precaution Comments: reviewed back precautions       Mobility Bed Mobility Overal bed mobility: Needs Assistance Bed Mobility: Rolling;Sidelying to Sit;Sit to Sidelying Rolling: Min guard Sidelying to sit: Min guard     Sit to sidelying: Min guard General bed mobility comments: verbal cues for log roll technique  Transfers Overall transfer level: Needs assistance Equipment used: Rolling walker (2 wheeled) Transfers: Sit to/from Stand Sit to Stand: Min guard         General transfer comment: continues to pull up on RW however good with back precautions        ADL Overall ADL's : Needs assistance/impaired     Grooming: Wash/dry hands;Set up;Supervision/safety;Standing       Lower Body Bathing: Moderate assistance;Sit to/from stand           Toilet Transfer: Min guard;Ambulation;BSC;RW   Toileting- Clothing Manipulation and Hygiene: Sit to/from stand;Minimal assistance         General ADL Comments: Pt continues to struggle with pain.  RN aware. Pt not able to tolerate sitting in recliner.  Pt agreed to walk in room, in hall and stand at sink but could not tolerate sitting                Cognition   Behavior During Therapy: Restless Overall Cognitive Status: Impaired/Different from baseline (wife reports pt a tad confused. RN made aware)                               General Comments      Pertinent Vitals/ Pain       Pain  Score: 8  Pain Location: low back , L back of thigh Pain Descriptors / Indicators: Jabbing Pain Intervention(s): Repositioned;Patient requesting pain meds-RN notified;Relaxation;Ice applied         Frequency Min 2X/week        Plan Discharge plan remains appropriate       End of Session Equipment Utilized During Treatment: Rolling walker   Activity Tolerance Patient limited by pain   Patient Left in bed   Nurse Communication Mobility status        Time: 1040-1130 OT Time Calculation (min): 50 min  Charges: OT General Charges $OT Visit: 1 Procedure OT Treatments $Self Care/Home Management : 38-52 mins  Millard Bautch, Thereasa Parkin 02/22/2014, 11:47 AM

## 2014-02-22 NOTE — Progress Notes (Addendum)
PT Cancellation Note  Patient Details Name: [name] MRN: [id number] DOB: 24-Jul-1926   Cancelled Treatment:    Reason Eval/Treat Not Completed:  (attempted PT x 2 this morning. Around 8:30 am pt had angina, RN notified. Later around 11:45 pt had been up for awhile with OT and he was too tired to participate with PT. Will follow. )   Philomena Doheny 02/22/2014, 1:23 PM [phone]

## 2014-02-22 NOTE — Progress Notes (Signed)
   Subjective: 3 Days Post-Op Procedure(s) (LRB): MICRO LUMBAR DECOMPRESSION L4-5/L3-4 (N/A)  Pt states he feels 'awful'  Still having pain to bilateral lower legs and buttocks Hard to get comfortable in bed Plan to d/c home once pain under control Patient reports pain as moderate.  Objective:   VITALS:   Filed Vitals:   02/22/14 0423  BP: 139/60  Pulse: 89  Temp: 98.9 F (37.2 C)  Resp: 16    Lumbar incision healing well nv intact distally Moderate difficulty with movement due to pain rolling from side to side No rashes or edema distally  LABS  Recent Labs  02/20/14 0435 02/21/14 0520 02/22/14 0509  HGB 11.2* 10.1* 9.6*  HCT 33.4* 30.3* 28.6*  WBC 19.0* 11.0* 9.7  PLT 106* 80* 70*     Recent Labs  02/20/14 0435 02/21/14 0520 02/22/14 0509  NA 141 140 143  K 4.2 3.7 3.6*  BUN 11 12 10   CREATININE 1.15 1.08 0.90  GLUCOSE 185* 156* 129*     Assessment/Plan: 3 Days Post-Op Procedure(s) (LRB): MICRO LUMBAR DECOMPRESSION L4-5/L3-4 (N/A) Plan to work with PT/OT and pain control today Possible d/c tomorrow if pt feeling better Patient in agreement     Merla Riches, MPAS, PA-C  02/22/2014, 8:48 AM

## 2014-02-23 LAB — CBC
HEMATOCRIT: 28.9 % — AB (ref 39.0–52.0)
Hemoglobin: 9.8 g/dL — ABNORMAL LOW (ref 13.0–17.0)
MCH: 28.9 pg (ref 26.0–34.0)
MCHC: 33.9 g/dL (ref 30.0–36.0)
MCV: 85.3 fL (ref 78.0–100.0)
Platelets: 87 10*3/uL — ABNORMAL LOW (ref 150–400)
RBC: 3.39 MIL/uL — ABNORMAL LOW (ref 4.22–5.81)
RDW: 14.6 % (ref 11.5–15.5)
WBC: 6.9 10*3/uL (ref 4.0–10.5)

## 2014-02-23 LAB — BASIC METABOLIC PANEL
Anion gap: 10 (ref 5–15)
BUN: 14 mg/dL (ref 6–23)
CALCIUM: 8.5 mg/dL (ref 8.4–10.5)
CHLORIDE: 105 meq/L (ref 96–112)
CO2: 27 meq/L (ref 19–32)
CREATININE: 0.96 mg/dL (ref 0.50–1.35)
GFR calc Af Amer: 85 mL/min — ABNORMAL LOW (ref >90)
GFR calc non Af Amer: 73 mL/min — ABNORMAL LOW (ref >90)
Glucose, Bld: 117 mg/dL — ABNORMAL HIGH (ref 70–99)
Potassium: 3.4 mEq/L — ABNORMAL LOW (ref 3.7–5.3)
Sodium: 142 mEq/L (ref 137–147)

## 2014-02-23 NOTE — Discharge Summary (Signed)
Physician Discharge Summary   Patient ID: [name] MRN: [id number] DOB/AGE: [birth year] 80 y.o.  Admit date: 02/19/2014 Discharge date: 02/23/2014  Admission Diagnoses:  Active Problems:   Spinal stenosis of lumbar region at multiple levels   Discharge Diagnoses:  Same   Surgeries: Procedure(s): MICRO LUMBAR DECOMPRESSION L4-5/L3-4 on 02/19/2014   Consultants: PT/OT  Discharged Condition: Stable  Hospital Course: [name] is an 80 y.o. male who was admitted 02/19/2014 with a chief complaint of No chief complaint on file. , and found to have a diagnosis of <principal problem not specified>.  They were brought to the operating room on 02/19/2014 and underwent the above named procedures.    The patient had an uncomplicated hospital course and was stable for discharge.  Recent vital signs:  Filed Vitals:   02/23/14 0515  BP: 132/69  Pulse: 69  Temp: 98.7 F (37.1 C)  Resp: 16    Recent laboratory studies:  Results for orders placed during the hospital encounter of 02/19/14  CBC      Result Value Ref Range   WBC 19.0 (*) 4.0 - 10.5 K/uL   RBC 3.95 (*) 4.22 - 5.81 MIL/uL   Hemoglobin 11.2 (*) 13.0 - 17.0 g/dL   HCT 33.4 (*) 39.0 - 52.0 %   MCV 84.6  78.0 - 100.0 fL   MCH 28.4  26.0 - 34.0 pg   MCHC 33.5  30.0 - 36.0 g/dL   RDW 14.3  11.5 - 15.5 %   Platelets 106 (*) 150 - 400 K/uL  BASIC METABOLIC PANEL      Result Value Ref Range   Sodium 141  137 - 147 mEq/L   Potassium 4.2  3.7 - 5.3 mEq/L   Chloride 105  96 - 112 mEq/L   CO2 23  19 - 32 mEq/L   Glucose, Bld 185 (*) 70 - 99 mg/dL   BUN 11  6 - 23 mg/dL   Creatinine, Ser 1.15  0.50 - 1.35 mg/dL   Calcium 8.3 (*) 8.4 - 10.5 mg/dL   GFR calc non Af Amer 56 (*) >90 mL/min   GFR calc Af Amer 65 (*) >90 mL/min   Anion gap 13  5 - 15  CBC      Result Value Ref Range   WBC 11.0 (*) 4.0 - 10.5 K/uL   RBC 3.52 (*) 4.22 - 5.81 MIL/uL   Hemoglobin 10.1 (*) 13.0 - 17.0 g/dL   HCT 30.3 (*) 39.0 - 52.0 %   MCV 86.1  78.0 - 100.0 fL   MCH 28.7  26.0 - 34.0 pg   MCHC 33.3  30.0 - 36.0 g/dL   RDW 14.7  11.5 - 15.5 %   Platelets 80 (*) 150 - 400 K/uL  BASIC METABOLIC PANEL      Result Value Ref Range   Sodium 140  137 - 147 mEq/L   Potassium 3.7  3.7 - 5.3 mEq/L   Chloride 106  96 - 112 mEq/L   CO2 24  19 - 32 mEq/L   Glucose, Bld 156 (*) 70 - 99 mg/dL   BUN 12  6 - 23 mg/dL   Creatinine, Ser 1.08  0.50 - 1.35 mg/dL   Calcium 8.0 (*) 8.4 - 10.5 mg/dL   GFR calc non Af Amer 61 (*) >90 mL/min   GFR calc Af Amer 70 (*) >90 mL/min   Anion gap 10  5 - 15  CBC      Result  Value Ref Range   WBC 9.7  4.0 - 10.5 K/uL   RBC 3.33 (*) 4.22 - 5.81 MIL/uL   Hemoglobin 9.6 (*) 13.0 - 17.0 g/dL   HCT 28.6 (*) 39.0 - 52.0 %   MCV 85.9  78.0 - 100.0 fL   MCH 28.8  26.0 - 34.0 pg   MCHC 33.6  30.0 - 36.0 g/dL   RDW 14.6  11.5 - 15.5 %   Platelets 70 (*) 150 - 400 K/uL  BASIC METABOLIC PANEL      Result Value Ref Range   Sodium 143  137 - 147 mEq/L   Potassium 3.6 (*) 3.7 - 5.3 mEq/L   Chloride 108  96 - 112 mEq/L   CO2 25  19 - 32 mEq/L   Glucose, Bld 129 (*) 70 - 99 mg/dL   BUN 10  6 - 23 mg/dL   Creatinine, Ser 0.90  0.50 - 1.35 mg/dL   Calcium 8.4  8.4 - 10.5 mg/dL   GFR calc non Af Amer 75 (*) >90 mL/min   GFR calc Af Amer 87 (*) >90 mL/min   Anion gap 10  5 - 15  CBC      Result Value Ref Range   WBC 6.9  4.0 - 10.5 K/uL   RBC 3.39 (*) 4.22 - 5.81 MIL/uL   Hemoglobin 9.8 (*) 13.0 - 17.0 g/dL   HCT 28.9 (*) 39.0 - 52.0 %   MCV 85.3  78.0 - 100.0 fL   MCH 28.9  26.0 - 34.0 pg   MCHC 33.9  30.0 - 36.0 g/dL   RDW 14.6  11.5 - 15.5 %   Platelets 87 (*) 150 - 400 K/uL  BASIC METABOLIC PANEL      Result Value Ref Range   Sodium 142  137 - 147 mEq/L   Potassium 3.4 (*) 3.7 - 5.3 mEq/L   Chloride 105  96 - 112 mEq/L   CO2 27  19 - 32 mEq/L   Glucose, Bld 117 (*) 70 - 99 mg/dL   BUN 14  6 - 23 mg/dL   Creatinine, Ser 0.96  0.50 - 1.35 mg/dL   Calcium 8.5  8.4 - 10.5 mg/dL   GFR calc  non Af Amer 73 (*) >90 mL/min   GFR calc Af Amer 85 (*) >90 mL/min   Anion gap 10  5 - 15    Discharge Medications:     Medication List    TAKE these medications       docusate sodium 100 MG capsule  Commonly known as:  COLACE  Take 1 capsule (100 mg total) by mouth 2 (two) times daily as needed for mild constipation.      ASK your doctor about these medications       alfuzosin 10 MG 24 hr tablet  Commonly known as:  UROXATRAL  Take 10 mg by mouth at bedtime.     amLODipine 5 MG tablet  Commonly known as:  NORVASC  Take 5 mg by mouth every morning.     aspirin EC 325 MG tablet  Take 325 mg by mouth every morning.     bimatoprost 0.03 % ophthalmic solution  Commonly known as:  LUMIGAN  Place 1 drop into both eyes at bedtime.     cholecalciferol 1000 UNITS tablet  Commonly known as:  VITAMIN D  Take 1,000 Units by mouth every morning.     clopidogrel 75 MG tablet  Commonly known as:  PLAVIX  Take 75 mg by mouth every morning.     finasteride 5 MG tablet  Commonly known as:  PROSCAR  Take 5 mg by mouth every morning.     GARLIC PO  Take 1 tablet by mouth every morning.     HYDROCODONE-ACETAMINOPHEN PO  Take by mouth. AS NEEDED FOR BACK PAIN  Ask about: Which instructions should I use?     HYDROcodone-acetaminophen 5-325 MG per tablet  Commonly known as:  NORCO/VICODIN  Take 1 tablet by mouth every 4 (four) hours as needed.  Ask about: Which instructions should I use?     isosorbide mononitrate 120 MG 24 hr tablet  Commonly known as:  IMDUR  Take 120 mg by mouth every morning.     ISTALOL 0.5 % (DAILY) Soln  Generic drug:  Timolol Maleate  Place 1 drop into both eyes every morning.     metoprolol 50 MG tablet  Commonly known as:  LOPRESSOR  Take 75 mg by mouth 2 (two) times daily.     nitroGLYCERIN 0.4 MG SL tablet  Commonly known as:  NITROSTAT  Place 0.4 mg under the tongue every 5 (five) minutes as needed for chest pain.     OVER THE COUNTER  MEDICATION  Take 1 capsule by mouth every morning. Mega Red Omega-3 Krill Oil     trolamine salicylate 10 % cream  Commonly known as:  ASPERCREME  Apply 1 application topically daily as needed (For back pain.).        Diagnostic Studies: Dg Lumbar Spine 2-3 Views  02/13/2014   CLINICAL DATA:  Low back pain, pain in both buttocks extending into RIGHT lower leg  EXAM: LUMBAR SPINE - 2-3 VIEW  COMPARISON:  CT lumbar myelography 01/01/2014  FINDINGS: Five non-rib-bearing lumbar vertebrae labeled on previous exam and current study as well.  Scattered mild disc space narrowing and endplate spur formation.  Vertebral body heights maintained without fracture or subluxation.  Facet degenerative changes lower lumbar spine.  Minimal broad-based lumbar levoconvex scoliosis.  Osseous demineralization.  Narrowing of LEFT hip joint.  Atherosclerotic calcification aorta.  IMPRESSION: Mild scattered degenerative disc and facet disease changes of lumbar spine with minimal associated dextro convex lumbar scoliosis.   Electronically Signed   By: Lavonia Dana M.D.   On: 02/13/2014 13:00   Dg Spine Portable 1 View  02/19/2014   CLINICAL DATA:  For a L3-4 and L4-5 decompression  EXAM: PORTABLE SPINE - 1 VIEW  COMPARISON:  Preoperative lumbar spine films of 02/13/2014  FINDINGS: On image 2, a clamp is located beneath the spinous process of L2 with an additional clamp on the spinous process posteriorly of L4  IMPRESSION: Metallic clamps for localization as described above, on image 2.   Electronically Signed   By: Ivar Drape M.D.   On: 02/19/2014 11:13   Dg Spine Portable 1 View  02/19/2014   CLINICAL DATA:  For L3-4 and L4-5 lumbar spine surgery  EXAM: PORTABLE SPINE - 1 VIEW  COMPARISON:  Preoperative lumbar spine films of 02/13/2014  FINDINGS: Clamps are located posteriorly beneath the spinous process of L3 and on the spinous process of L5 with an instrument in between these clamps directed toward the L4-5 interspace for  localization.  IMPRESSION: Instrument directed towards L4-5 interspace on image 3.   Electronically Signed   By: Ivar Drape M.D.   On: 02/19/2014 11:10   Dg Spine Portable 1 View  02/19/2014   CLINICAL DATA:  L3-4, L4-5 decompression  EXAM: PORTABLE SPINE - 1 VIEW  COMPARISON:  02/13/2014  FINDINGS: Single lateral lumbar radiograph in the operating room  Needle is located between the L2 and L3 spinous processes.  Needle located over the L4 spinous process.  Grade 1 anterior slip L4-5.  Multilevel lumbar disc degeneration.  IMPRESSION: Lumbar localization as above.   Electronically Signed   By: Franchot Gallo M.D.   On: 02/19/2014 10:44    Disposition: 01-Home or Self Care        Follow-up Information   Follow up with BEANE,JEFFREY C, MD In 2 weeks.   Specialty:  Orthopedic Surgery   Contact information:   [address] [place] [address] [phone]        Signed: Ventura Bruns 02/23/2014, 8:08 AM

## 2014-02-23 NOTE — Progress Notes (Signed)
CARE MANAGEMENT NOTE 02/23/2014  Patient:  JAQUELL, SEDDON   Account Number:  [id number]  Date Initiated:  02/21/2014  Documentation initiated by:  [facility]  Subjective/Objective Assessment:   80 Y/O M ADMITTED W/SPINAL STENOSIS.     Action/Plan:   FROM HOME.   Anticipated DC Date:  02/22/2014   Anticipated DC Plan:  [place]  CM consult      Choice offered to / List presented to:             Status of service:  Completed, signed off Medicare Important Message given?  YES (If response is "NO", the following Medicare IM given date fields will be blank) Date Medicare IM given:  02/23/2014 Medicare IM given by:  [facility] Date Additional Medicare IM given:   Additional Medicare IM given by:    Discharge Disposition:  HOME/SELF CARE  Per UR Regulation:  Reviewed for med. necessity/level of care/duration of stay  If discussed at [place] of Stay Meetings, dates discussed:    Comments:  02/23/2014 1400 No HH PT recommended. Pt has RW at home. Jonnie Finner RN CCM Case Mgmt phone [phone]  02/21/14 KATHY MAHABIR RN,BSN NCM [phone] PT/OT-NO F/U.NEED 3N1-PLEASE PUT IN ORDER FOR 3N1.SEE CSW NOTE.NO FURTHER D/C NEEDS.

## 2014-02-23 NOTE — Progress Notes (Signed)
   Subjective: 4 Days Post-Op Procedure(s) (LRB): MICRO LUMBAR DECOMPRESSION L4-5/L3-4 (N/A)  Pt feeling better today Ready for d/c home Still c/o lumbar pain and buttock pain mild to moderate Patient reports pain as moderate.  Objective:   VITALS:   Filed Vitals:   02/23/14 0515  BP: 132/69  Pulse: 69  Temp: 98.7 F (37.1 C)  Resp: 16    Lumbar incision healing well nv intact distally Better movement today  LABS  Recent Labs  02/21/14 0520 02/22/14 0509 02/23/14 0505  HGB 10.1* 9.6* 9.8*  HCT 30.3* 28.6* 28.9*  WBC 11.0* 9.7 6.9  PLT 80* 70* 87*     Recent Labs  02/21/14 0520 02/22/14 0509 02/23/14 0505  NA 140 143 142  K 3.7 3.6* 3.4*  BUN 12 10 14   CREATININE 1.08 0.90 0.96  GLUCOSE 156* 129* 117*     Assessment/Plan: 4 Days Post-Op Procedure(s) (LRB): MICRO LUMBAR DECOMPRESSION L4-5/L3-4 (N/A)  Plan to d/c home today F/u in 2 weeks   Merla Riches, MPAS, PA-C  02/23/2014, 8:06 AM

## 2014-02-23 NOTE — Progress Notes (Signed)
Discharged from floor via w/c, wife with pt. No changes in assessment. [name], [employer]

## 2014-06-12 ENCOUNTER — Encounter (HOSPITAL_COMMUNITY): Payer: Self-pay | Admitting: Interventional Cardiology

## 2014-07-21 ENCOUNTER — Encounter: Payer: Self-pay | Admitting: *Deleted

## 2014-08-27 NOTE — H&P (Signed)
TOTAL HIP ADMISSION H&P  Patient is admitted for left total hip arthroplasty.  Subjective:  Chief Complaint: left hip pain  HPI: [name], 81 y.o. male, has a history of pain and functional disability in the left hip(s) due to arthritis and patient has failed non-surgical conservative treatments for greater than 12 weeks to include NSAID's and/or analgesics, corticosteriod injections, flexibility and strengthening excercises, use of assistive devices and activity modification.  Onset of symptoms was gradual starting 5 years ago with gradually worsening course since that time.The patient noted no past surgery on the left hip(s).  Patient currently rates pain in the left hip at 10 out of 10 with activity. Patient has night pain, worsening of pain with activity and weight bearing, pain that interfers with activities of daily living and pain with passive range of motion. Patient has evidence of subchondral cysts, subchondral sclerosis, periarticular osteophytes and joint space narrowing by imaging studies. This condition presents safety issues increasing the risk of falls. There is no current active infection.  Patient Active Problem List   Diagnosis Date Noted  . Spinal stenosis of lumbar region at multiple levels 02/19/2014  . Thrombocytopenia 06/30/2012    Class: Acute  . CAD (coronary artery disease) 06/26/2012  . S/P CABG (coronary artery bypass graft)   . Hyperlipidemia   . History of prostate cancer   . HIstory of villous adenoma of ascending colon    Past Medical History  Diagnosis Date  . H/O prostate cancer     Prior cryoablation   . Hyperlipidemia 06/26/2012  . S/P CABG (coronary artery bypass graft)   . Villous adenoma of rectum   . Anxiety   . Cancer     HX PROSTATE CANCER TREATED WITH CRYOABLATION; HX OF VILLOUS ADENOMA / PARTIAL COLECTOMY  . Pain     SEVERE BACK PAIN WITH NUMBNESS LOWER EXTREMITIES - HAS SPINAL STENOSIS  . Anginal pain     MILD CHEST PAIN -  OCCASIONAL NTG  . CAD (coronary artery disease) 06/26/2012    PTCA RCA 1995 CABG w. LIMA to LAD, SVG to dx 1, PD and PL March 2000 Dr. Servando Snare for 3VD Cypher stent SVG to RCA  2005 redo CABG 07/2005 with SVG to OM and SVG to PD-PL   DR. TILLEY IS PT'S CARDIOLOGIST    Past Surgical History  Procedure Laterality Date  . Coronary artery bypass graft  2000, 2007    LIMA and SVGs to Diag 1 PDA PLR March 2000,  SVG to OM and SVG to PDA-PL 07/2005  . Partial colectomy  2007  . Prostate cryoablation    . Hernia repair    . Nasal septum surgery    . Lumbar laminectomy/decompression microdiscectomy N/A 02/19/2014    Procedure: MICRO LUMBAR DECOMPRESSION L4-5/L3-4;  Surgeon: Johnn Hai, MD;  Location: [facility] ORS;  Service: Orthopedics;  Laterality: N/A;  . Left heart catheterization with coronary/graft angiogram N/A 06/29/2012    Procedure: LEFT HEART CATHETERIZATION WITH Beatrix Fetters;  Surgeon: Sinclair Grooms, MD;  Location: [facility] CATH LAB;  Service: Cardiovascular;  Laterality: N/A;  . Percutaneous coronary stent intervention (pci-s) N/A 07/03/2012    Procedure: PERCUTANEOUS CORONARY STENT INTERVENTION (PCI-S);  Surgeon: Sinclair Grooms, MD;  Location: [facility] CATH LAB;  Service: Cardiovascular;  Laterality: N/A;  . Left heart catheterization with coronary/graft angiogram N/A 02/28/2013    Procedure: LEFT HEART CATHETERIZATION WITH Beatrix Fetters;  Surgeon: Jacolyn Reedy, MD;  Location: [facility] CATH LAB;  Service:  Cardiovascular;  Laterality: N/A;    No prescriptions prior to admission   Allergies  Allergen Reactions  . Fenofibrate Other (See Comments)    Reaction unknown  . Gemfibrozil Other (See Comments)    Reaction unknown  . Ranexa [Ranolazine] Other (See Comments)    Reaction unknown   . Red Yeast Rice [Cholestin] Other (See Comments)    Reaction unknown   . Statins Other (See Comments)    Muscle pain  . Zetia [Ezetimibe] Other (See Comments)    Reaction unknown      History  Substance Use Topics  . Smoking status: Former Research scientist (life sciences)  . Smokeless tobacco: Never Used     Comment: quit in 1989  . Alcohol Use: 0.6 oz/week    1 Standard drinks or equivalent per week    Family History  Problem Relation Age of Onset  . Prostate cancer Father   . Lung cancer Mother   . CVA Brother   . Brain cancer Daughter      Review of Systems  Constitutional: Negative.   HENT: Positive for hearing loss.   Eyes: Negative.   Respiratory: Negative.  Negative for shortness of breath.   Cardiovascular: Negative.  Negative for chest pain.  Gastrointestinal: Negative.   Genitourinary: Negative.   Musculoskeletal: Positive for back pain and joint pain. Negative for myalgias, falls and neck pain.  Skin: Negative.   Neurological: Negative.  Negative for headaches.  Endo/Heme/Allergies: Negative.   Psychiatric/Behavioral: Negative.     Objective:  Physical Exam  Vitals reviewed. Constitutional: He is oriented to person, place, and time. He appears well-developed and well-nourished.  HENT:  Head: Normocephalic and atraumatic.  Eyes: EOM are normal. Pupils are equal, round, and reactive to light.  Neck: Normal range of motion. Neck supple.  Cardiovascular: Normal rate, regular rhythm, normal heart sounds and intact distal pulses.   Respiratory: Effort normal and breath sounds normal.  GI: Soft. Bowel sounds are normal.  Genitourinary:  deferred  Musculoskeletal:  Pain with rom left hip  Neurological: He is alert and oriented to person, place, and time.  Skin: Skin is warm and dry.  Psychiatric: He has a normal mood and affect. His behavior is normal. Judgment and thought content normal.    Vital signs in last 24 hours:    Labs:   Estimated body mass index is 27.45 kg/(m^2) as calculated from the following:   Height as of 02/19/14: 5\' 6"  (1.676 m).   Weight as of 02/13/14: 77.111 kg (170 lb).   Imaging Review Plain radiographs demonstrate severe  degenerative joint disease of the left hip(s). The bone quality appears to be adequate for age and reported activity level.  Assessment/Plan:  End stage arthritis, left hip(s)  The patient history, physical examination, clinical judgement of the provider and imaging studies are consistent with end stage degenerative joint disease of the left hip(s) and total hip arthroplasty is deemed medically necessary. The treatment options including medical management, injection therapy, arthroscopy and arthroplasty were discussed at length. The risks and benefits of total hip arthroplasty were presented and reviewed. The risks due to aseptic loosening, infection, stiffness, dislocation/subluxation,  thromboembolic complications and other imponderables were discussed.  The patient acknowledged the explanation, agreed to proceed with the plan and consent was signed. Patient is being admitted for inpatient treatment for surgery, pain control, PT, OT, prophylactic antibiotics, VTE prophylaxis, progressive ambulation and ADL's and discharge planning.The patient is planning to be discharged home with home health services

## 2014-08-27 NOTE — Progress Notes (Signed)
Please put orders in Epic surgery 09-04-14 pre op 08-28-14 Thanks

## 2014-08-27 NOTE — Patient Instructions (Addendum)
[name]  08/27/2014   Your procedure is scheduled on:   09-04-2014 Thursday  Enter through [facility]  Entrance and follow signs to [facility]. Arrive at        1:00 PM.  Call this number if you have problems the morning of surgery: [phone]  Or Presurgical Testing [phone].   For Living Will and/or Health Care Power Attorney Forms: please provide copy for your medical record,may bring AM of surgery(Forms should be already notarized -we do not provide this service).(08-27-14 Yes,, will not bring documents-spouse will bring if needed.)     Do not eat food/ or drink: After Midnight.  Exception: may have clear liquids:up to 6 Hours before arrival. Nothing after: 0900 AM.  Clear liquids include soda, tea, black coffee, apple or grape juice, broth.  Take these medicines the morning of surgery with A SIP OF WATER: Amlodipine. Isosorbide.Metoprolol.Memantine Use/bring eye drops.   Do not wear jewelry, make-up or nail polish.  Do not wear deodorant, lotions, powders, or perfumes.   Do not shave legs and under arms- 48 hours(2 days) prior to first CHG shower.(Shaving face and neck okay.)  Do not bring valuables to the hospital.(Hospital is not responsible for lost valuables).  Contacts, dentures or removable bridgework, body piercing, hair pins may not be worn into surgery.  Leave suitcase in the car. After surgery it may be brought to your room.  For patients admitted to the hospital, checkout time is 11:00 AM the day of discharge.(Restricted visitors-Any Persons displaying flu-like symptoms or illness).    Patients discharged the day of surgery will not be allowed to drive home. Must have responsible person with you x 24 hours once discharged.  Name and phone number of your driver: [name] -spouse [phone] h  Incentive Spirometer  An incentive spirometer is a tool that can help keep your lungs clear and active. This tool measures how well you are filling your lungs  with each breath. Taking long deep breaths may help reverse or decrease the chance of developing breathing (pulmonary) problems (especially infection) following:  A long period of time when you are unable to move or be active. BEFORE THE PROCEDURE   If the spirometer includes an indicator to show your best effort, your nurse or respiratory therapist will set it to a desired goal.  If possible, sit up straight or lean slightly forward. Try not to slouch.  Hold the incentive spirometer in an upright position. INSTRUCTIONS FOR USE   Sit on the edge of your bed if possible, or sit up as far as you can in bed or on a chair.  Hold the incentive spirometer in an upright position.  Breathe out normally.  Place the mouthpiece in your mouth and seal your lips tightly around it.  Breathe in slowly and as deeply as possible, raising the piston or the ball toward the top of the column.  Hold your breath for 3-5 seconds or for as long as possible. Allow the piston or ball to fall to the bottom of the column.  Remove the mouthpiece from your mouth and breathe out normally.  Rest for a few seconds and repeat Steps 1 through 7 at least 10 times every 1-2 hours when you are awake. Take your time and take a few normal breaths between deep breaths.  The spirometer may include an indicator to show your best effort. Use the indicator as a goal to work toward during each repetition.  After each  set of 10 deep breaths, practice coughing to be sure your lungs are clear. If you have an incision (the cut made at the time of surgery), support your incision when coughing by placing a pillow or rolled up towels firmly against it. Once you are able to get out of bed, walk around indoors and cough well. You may stop using the incentive spirometer when instructed by your caregiver.  RISKS AND COMPLICATIONS  Take your time so you do not get dizzy or light-headed.  If you are in pain, you may need to take or ask  for pain medication before doing incentive spirometry. It is harder to take a deep breath if you are having pain. AFTER USE  Rest and breathe slowly and easily.  It can be helpful to keep track of a log of your progress. Your caregiver can provide you with a simple table to help with this. If you are using the spirometer at home, follow these instructions: [place] IF:   You are having difficultly using the spirometer.  You have trouble using the spirometer as often as instructed.  Your pain medication is not giving enough relief while using the spirometer.  You develop fever of 100.5 F (38.1 C) or higher. SEEK IMMEDIATE MEDICAL CARE IF:   You cough up bloody sputum that had not been present before.  You develop fever of 102 F (38.9 C) or greater.  You develop worsening pain at or near the incision site. MAKE SURE YOU:   Understand these instructions.  Will watch your condition.  Will get help right away if you are not doing well or get worse. Document Released: 10/31/2006 Document Revised: 09/12/2011 Document Reviewed: 01/01/2007 ExitCare Patient Information 2014 ExitCare, [place].   ________________________________________________________________________  WHAT IS A BLOOD TRANSFUSION? Blood Transfusion Information  A transfusion is the replacement of blood or some of its parts. Blood is made up of multiple cells which provide different functions.  Red blood cells carry oxygen and are used for blood loss replacement.  White blood cells fight against infection.  Platelets control bleeding.  Plasma helps clot blood.  Other blood products are available for specialized needs, such as hemophilia or other clotting disorders. BEFORE THE TRANSFUSION  Who gives blood for transfusions?   Healthy volunteers who are fully evaluated to make sure their blood is safe. This is blood bank blood. Transfusion therapy is the safest it has ever been in the practice of  medicine. Before blood is taken from a donor, a complete history is taken to make sure that person has no history of diseases nor engages in risky social behavior (examples are intravenous drug use or sexual activity with multiple partners). The donor's travel history is screened to minimize risk of transmitting infections, such as malaria. The donated blood is tested for signs of infectious diseases, such as HIV and hepatitis. The blood is then tested to be sure it is compatible with you in order to minimize the chance of a transfusion reaction. If you or a relative donates blood, this is often done in anticipation of surgery and is not appropriate for emergency situations. It takes many days to process the donated blood. RISKS AND COMPLICATIONS Although transfusion therapy is very safe and saves many lives, the main dangers of transfusion include:   Getting an infectious disease.  Developing a transfusion reaction. This is an allergic reaction to something in the blood you were given. Every precaution is taken to prevent this. The decision to have  a blood transfusion has been considered carefully by your caregiver before blood is given. Blood is not given unless the benefits outweigh the risks. AFTER THE TRANSFUSION  Right after receiving a blood transfusion, you will usually feel much better and more energetic. This is especially true if your red blood cells have gotten low (anemic). The transfusion raises the level of the red blood cells which carry oxygen, and this usually causes an energy increase.  The nurse administering the transfusion will monitor you carefully for complications. HOME CARE INSTRUCTIONS  No special instructions are needed after a transfusion. You may find your energy is better. Speak with your caregiver about any limitations on activity for underlying diseases you may have. SEEK MEDICAL CARE IF:   Your condition is not improving after your transfusion.  You develop  redness or irritation at the intravenous (IV) site. SEEK IMMEDIATE MEDICAL CARE IF:  Any of the following symptoms occur over the next 12 hours:  Shaking chills.  You have a temperature by mouth above 102 F (38.9 C), not controlled by medicine.  Chest, back, or muscle pain.  People around you feel you are not acting correctly or are confused.  Shortness of breath or difficulty breathing.  Dizziness and fainting.  You get a rash or develop hives.  You have a decrease in urine output.  Your urine turns a dark color or changes to pink, red, or brown. Any of the following symptoms occur over the next 10 days:  You have a temperature by mouth above 102 F (38.9 C), not controlled by medicine.  Shortness of breath.  Weakness after normal activity.  The white part of the eye turns yellow (jaundice).  You have a decrease in the amount of urine or are urinating less often.  Your urine turns a dark color or changes to pink, red, or brown. Document Released: 06/17/2000 Document Revised: 09/12/2011 Document Reviewed: 02/04/2008 ExitCare Patient Information 2014 ExitCare, [place].  _______________________________________________________________________     Please read over the following fact sheets that you were given:  CHG(Chlorhexidine Gluconate 4% Surgical Soap) use, MRSA Information, Blood Transfusion fact sheet, Incentive Spirometry Instruction.  Remember : Type/Screen "Blue armbands" - may not be removed once applied(would result in being retested AM of surgery, if removed).         [facility] - Preparing for Surgery Before surgery, you can play an important role.  Because skin is not sterile, your skin needs to be as free of germs as possible.  You can reduce the number of germs on your skin by washing with CHG (chlorahexidine gluconate) soap before surgery.  CHG is an antiseptic cleaner which kills germs and bonds with the skin to continue killing germs even after  washing. Please DO NOT use if you have an allergy to CHG or antibacterial soaps.  If your skin becomes reddened/irritated stop using the CHG and inform your nurse when you arrive at Short Stay. Do not shave (including legs and underarms) for at least 48 hours prior to the first CHG shower.  You may shave your face/neck. Please follow these instructions carefully:  1.  Shower with CHG Soap the night before surgery and the  morning of Surgery.  2.  If you choose to wash your hair, wash your hair first as usual with your  normal  shampoo.  3.  After you shampoo, rinse your hair and body thoroughly to remove the  shampoo.  4.  Use CHG as you would any other liquid soap.  You can apply chg directly  to the skin and wash                       Gently with a scrungie or clean washcloth.  5.  Apply the CHG Soap to your body ONLY FROM THE NECK DOWN.   Do not use on face/ open                           Wound or open sores. Avoid contact with eyes, ears mouth and genitals (private parts).                       Wash face,  Genitals (private parts) with your normal soap.             6.  Wash thoroughly, paying special attention to the area where your surgery  will be performed.  7.  Thoroughly rinse your body with warm water from the neck down.  8.  DO NOT shower/wash with your normal soap after using and rinsing off  the CHG Soap.                9.  Pat yourself dry with a clean towel.            10.  Wear clean pajamas.            11.  Place clean sheets on your bed the night of your first shower and do not  sleep with pets. Day of Surgery : Do not apply any lotions/deodorants the morning of surgery.  Please wear clean clothes to the hospital/surgery center.  FAILURE TO FOLLOW THESE INSTRUCTIONS MAY RESULT IN THE CANCELLATION OF YOUR SURGERY PATIENT SIGNATURE_________________________________  NURSE  SIGNATURE__________________________________  ________________________________________________________________________

## 2014-08-28 ENCOUNTER — Encounter (HOSPITAL_COMMUNITY): Payer: Self-pay

## 2014-08-28 ENCOUNTER — Encounter (HOSPITAL_COMMUNITY)
Admission: RE | Admit: 2014-08-28 | Discharge: 2014-08-28 | Disposition: A | Discharge status: Home / Self Care | Payer: Medicare Other | Source: Ambulatory Visit | Attending: Orthopedic Surgery | Admitting: Orthopedic Surgery

## 2014-08-28 DIAGNOSIS — Z01818 Encounter for other preprocedural examination: Secondary | ICD-10-CM | POA: Insufficient documentation

## 2014-08-28 DIAGNOSIS — M1612 Unilateral primary osteoarthritis, left hip: Secondary | ICD-10-CM | POA: Insufficient documentation

## 2014-08-28 HISTORY — DX: Unspecified hearing loss, unspecified ear: H91.90

## 2014-08-28 HISTORY — DX: Other amnesia: R41.3

## 2014-08-28 LAB — URINALYSIS, ROUTINE W REFLEX MICROSCOPIC
Bilirubin Urine: NEGATIVE
Glucose, UA: NEGATIVE mg/dL
Hgb urine dipstick: NEGATIVE
Ketones, ur: NEGATIVE mg/dL
Leukocytes, UA: NEGATIVE
NITRITE: NEGATIVE
PH: 5 (ref 5.0–8.0)
Protein, ur: NEGATIVE mg/dL
SPECIFIC GRAVITY, URINE: 1.026 (ref 1.005–1.030)
Urobilinogen, UA: 0.2 mg/dL (ref 0.0–1.0)

## 2014-08-28 LAB — COMPREHENSIVE METABOLIC PANEL
ALK PHOS: 61 U/L (ref 39–117)
ALT: 20 U/L (ref 0–53)
AST: 19 U/L (ref 0–37)
Albumin: 4 g/dL (ref 3.5–5.2)
Anion gap: 6 (ref 5–15)
BILIRUBIN TOTAL: 0.8 mg/dL (ref 0.3–1.2)
BUN: 14 mg/dL (ref 6–23)
CALCIUM: 8.8 mg/dL (ref 8.4–10.5)
CHLORIDE: 110 mmol/L (ref 96–112)
CO2: 26 mmol/L (ref 19–32)
Creatinine, Ser: 1.06 mg/dL (ref 0.50–1.35)
GFR, EST AFRICAN AMERICAN: 72 mL/min — AB (ref >90)
GFR, EST NON AFRICAN AMERICAN: 62 mL/min — AB (ref >90)
GLUCOSE: 96 mg/dL (ref 70–99)
Potassium: 4.2 mmol/L (ref 3.5–5.1)
SODIUM: 142 mmol/L (ref 135–145)
Total Protein: 6.3 g/dL (ref 6.0–8.3)

## 2014-08-28 LAB — PROTIME-INR
INR: 1.28 (ref 0.00–1.49)
PROTHROMBIN TIME: 16.2 s — AB (ref 11.6–15.2)

## 2014-08-28 LAB — CBC
HEMATOCRIT: 41.8 % (ref 39.0–52.0)
HEMOGLOBIN: 13.5 g/dL (ref 13.0–17.0)
MCH: 27.5 pg (ref 26.0–34.0)
MCHC: 32.3 g/dL (ref 30.0–36.0)
MCV: 85.1 fL (ref 78.0–100.0)
Platelets: 104 10*3/uL — ABNORMAL LOW (ref 150–400)
RBC: 4.91 MIL/uL (ref 4.22–5.81)
RDW: 15.5 % (ref 11.5–15.5)
WBC: 8.8 10*3/uL (ref 4.0–10.5)

## 2014-08-28 LAB — SURGICAL PCR SCREEN
MRSA, PCR: NEGATIVE
Staphylococcus aureus: NEGATIVE

## 2014-08-28 LAB — APTT: APTT: 30 s (ref 24–37)

## 2014-08-28 NOTE — Progress Notes (Signed)
CBC results with platelet count of 104 done 08/28/2014 faxed to [phone] per request of Lia Hopping ( surgery scheduler ) for Dr Lyla Glassing.

## 2014-08-28 NOTE — Pre-Procedure Instructions (Signed)
08-28-14 EKG -08-07-14 report with chart, LOV notes (clearance Dr. Wynonia Lawman 08-07-14) with chart.

## 2014-09-04 ENCOUNTER — Inpatient Hospital Stay (HOSPITAL_COMMUNITY)
Admission: RE | Admit: 2014-09-04 | Discharge: 2014-09-11 | DRG: 469 | Disposition: A | Discharge status: Home Health | Payer: Medicare Other | Source: Ambulatory Visit | Attending: Orthopedic Surgery | Admitting: Orthopedic Surgery

## 2014-09-04 ENCOUNTER — Encounter (HOSPITAL_COMMUNITY)
Admission: RE | Disposition: A | Discharge status: Home Health | Payer: Medicare Other | Source: Ambulatory Visit | Attending: Orthopedic Surgery

## 2014-09-04 ENCOUNTER — Inpatient Hospital Stay (HOSPITAL_COMMUNITY): Payer: Medicare Other

## 2014-09-04 ENCOUNTER — Encounter (HOSPITAL_COMMUNITY): Payer: Self-pay | Admitting: Anesthesiology

## 2014-09-04 ENCOUNTER — Inpatient Hospital Stay (HOSPITAL_COMMUNITY): Payer: Medicare Other | Admitting: Anesthesiology

## 2014-09-04 DIAGNOSIS — D72829 Elevated white blood cell count, unspecified: Secondary | ICD-10-CM | POA: Diagnosis present

## 2014-09-04 DIAGNOSIS — M1612 Unilateral primary osteoarthritis, left hip: Secondary | ICD-10-CM | POA: Diagnosis present

## 2014-09-04 DIAGNOSIS — M25552 Pain in left hip: Secondary | ICD-10-CM | POA: Diagnosis present

## 2014-09-04 DIAGNOSIS — I25110 Atherosclerotic heart disease of native coronary artery with unstable angina pectoris: Secondary | ICD-10-CM | POA: Diagnosis present

## 2014-09-04 DIAGNOSIS — Z9049 Acquired absence of other specified parts of digestive tract: Secondary | ICD-10-CM | POA: Diagnosis present

## 2014-09-04 DIAGNOSIS — M25519 Pain in unspecified shoulder: Secondary | ICD-10-CM | POA: Diagnosis present

## 2014-09-04 DIAGNOSIS — I209 Angina pectoris, unspecified: Secondary | ICD-10-CM

## 2014-09-04 DIAGNOSIS — Z951 Presence of aortocoronary bypass graft: Secondary | ICD-10-CM

## 2014-09-04 DIAGNOSIS — Z01812 Encounter for preprocedural laboratory examination: Secondary | ICD-10-CM

## 2014-09-04 DIAGNOSIS — R7989 Other specified abnormal findings of blood chemistry: Secondary | ICD-10-CM

## 2014-09-04 DIAGNOSIS — D62 Acute posthemorrhagic anemia: Secondary | ICD-10-CM | POA: Diagnosis not present

## 2014-09-04 DIAGNOSIS — N179 Acute kidney failure, unspecified: Secondary | ICD-10-CM | POA: Diagnosis not present

## 2014-09-04 DIAGNOSIS — Z955 Presence of coronary angioplasty implant and graft: Secondary | ICD-10-CM | POA: Diagnosis not present

## 2014-09-04 DIAGNOSIS — Z419 Encounter for procedure for purposes other than remedying health state, unspecified: Secondary | ICD-10-CM

## 2014-09-04 DIAGNOSIS — Z09 Encounter for follow-up examination after completed treatment for conditions other than malignant neoplasm: Secondary | ICD-10-CM

## 2014-09-04 DIAGNOSIS — Z79899 Other long term (current) drug therapy: Secondary | ICD-10-CM | POA: Diagnosis not present

## 2014-09-04 DIAGNOSIS — Z87891 Personal history of nicotine dependence: Secondary | ICD-10-CM

## 2014-09-04 DIAGNOSIS — R778 Other specified abnormalities of plasma proteins: Secondary | ICD-10-CM | POA: Diagnosis present

## 2014-09-04 DIAGNOSIS — I214 Non-ST elevation (NSTEMI) myocardial infarction: Secondary | ICD-10-CM | POA: Diagnosis not present

## 2014-09-04 DIAGNOSIS — E785 Hyperlipidemia, unspecified: Secondary | ICD-10-CM | POA: Diagnosis present

## 2014-09-04 DIAGNOSIS — F0390 Unspecified dementia without behavioral disturbance: Secondary | ICD-10-CM | POA: Diagnosis present

## 2014-09-04 DIAGNOSIS — E861 Hypovolemia: Secondary | ICD-10-CM | POA: Diagnosis not present

## 2014-09-04 DIAGNOSIS — D696 Thrombocytopenia, unspecified: Secondary | ICD-10-CM | POA: Diagnosis present

## 2014-09-04 DIAGNOSIS — H9190 Unspecified hearing loss, unspecified ear: Secondary | ICD-10-CM | POA: Diagnosis present

## 2014-09-04 DIAGNOSIS — Z8546 Personal history of malignant neoplasm of prostate: Secondary | ICD-10-CM

## 2014-09-04 DIAGNOSIS — I2510 Atherosclerotic heart disease of native coronary artery without angina pectoris: Secondary | ICD-10-CM | POA: Diagnosis present

## 2014-09-04 HISTORY — PX: TOTAL HIP ARTHROPLASTY: SHX124

## 2014-09-04 HISTORY — DX: Unspecified osteoarthritis, unspecified site: M19.90

## 2014-09-04 LAB — PROTIME-INR
INR: 1.39 (ref 0.00–1.49)
Prothrombin Time: 17.2 seconds — ABNORMAL HIGH (ref 11.6–15.2)

## 2014-09-04 LAB — TYPE AND SCREEN
ABO/RH(D): O POS
Antibody Screen: NEGATIVE

## 2014-09-04 SURGERY — ARTHROPLASTY, HIP, TOTAL, ANTERIOR APPROACH
Anesthesia: General | Site: Hip | Laterality: Left

## 2014-09-04 MED ORDER — TIMOLOL MALEATE 0.5 % OP SOLG
1.0000 [drp] | Freq: Every day | OPHTHALMIC | Status: DC
  Administered 2014-09-05 – 2014-09-10 (×5): 1 [drp] via OPHTHALMIC
  Filled 2014-09-04 (×3): qty 5

## 2014-09-04 MED ORDER — LATANOPROST 0.005 % OP SOLN
1.0000 [drp] | Freq: Every day | OPHTHALMIC | Status: DC
  Administered 2014-09-06 – 2014-09-10 (×5): 1 [drp] via OPHTHALMIC
  Filled 2014-09-04 (×3): qty 2.5

## 2014-09-04 MED ORDER — DEXTROSE 5 % IV SOLN
500.0000 mg | Freq: Four times a day (QID) | INTRAVENOUS | Status: DC | PRN
  Administered 2014-09-04: 500 mg via INTRAVENOUS
  Filled 2014-09-04 (×2): qty 5

## 2014-09-04 MED ORDER — LIDOCAINE HCL (CARDIAC) 20 MG/ML IV SOLN
INTRAVENOUS | Status: DC | PRN
  Administered 2014-09-04: 100 mg via INTRAVENOUS

## 2014-09-04 MED ORDER — ONDANSETRON HCL 4 MG/2ML IJ SOLN: 4.0000 mg | Freq: Once | INTRAMUSCULAR | Status: DC

## 2014-09-04 MED ORDER — DEXAMETHASONE SODIUM PHOSPHATE 4 MG/ML IJ SOLN
4.0000 mg | Freq: Once | INTRAMUSCULAR | Status: DC
  Filled 2014-09-04: qty 1

## 2014-09-04 MED ORDER — TIMOLOL MALEATE (ONCE-DAILY) 0.5 % OP SOLN: 1.0000 [drp] | Freq: Every morning | OPHTHALMIC | Status: DC

## 2014-09-04 MED ORDER — DIPHENHYDRAMINE HCL 12.5 MG/5ML PO ELIX
12.5000 mg | ORAL_SOLUTION | ORAL | Status: DC | PRN
  Administered 2014-09-06: 12.5 mg via ORAL
  Filled 2014-09-04: qty 10

## 2014-09-04 MED ORDER — ASPIRIN EC 325 MG PO TBEC
325.0000 mg | DELAYED_RELEASE_TABLET | Freq: Two times a day (BID) | ORAL | Status: DC
  Administered 2014-09-05 – 2014-09-06 (×3): 325 mg via ORAL
  Filled 2014-09-04 (×4): qty 1

## 2014-09-04 MED ORDER — FINASTERIDE 5 MG PO TABS
5.0000 mg | ORAL_TABLET | Freq: Every morning | ORAL | Status: DC
  Administered 2014-09-05 – 2014-09-11 (×7): 5 mg via ORAL
  Filled 2014-09-04 (×7): qty 1

## 2014-09-04 MED ORDER — LIDOCAINE HCL (CARDIAC) 20 MG/ML IV SOLN
INTRAVENOUS | Status: AC
  Filled 2014-09-04: qty 5

## 2014-09-04 MED ORDER — DEXAMETHASONE SODIUM PHOSPHATE 10 MG/ML IJ SOLN
INTRAMUSCULAR | Status: DC | PRN
  Administered 2014-09-04: 10 mg via INTRAVENOUS

## 2014-09-04 MED ORDER — ACETAMINOPHEN 650 MG RE SUPP: 650.0000 mg | Freq: Four times a day (QID) | RECTAL | Status: DC | PRN

## 2014-09-04 MED ORDER — PROPOFOL 10 MG/ML IV BOLUS
INTRAVENOUS | Status: AC
  Filled 2014-09-04: qty 20

## 2014-09-04 MED ORDER — HYDROMORPHONE HCL 1 MG/ML IJ SOLN: 0.2500 mg | INTRAMUSCULAR | Status: DC | PRN

## 2014-09-04 MED ORDER — ISOSORBIDE MONONITRATE ER 60 MG PO TB24
120.0000 mg | ORAL_TABLET | Freq: Every day | ORAL | Status: DC
  Administered 2014-09-05: 120 mg via ORAL
  Filled 2014-09-04: qty 2

## 2014-09-04 MED ORDER — ACETAMINOPHEN 325 MG PO TABS
650.0000 mg | ORAL_TABLET | Freq: Four times a day (QID) | ORAL | Status: DC | PRN
  Administered 2014-09-06 – 2014-09-09 (×2): 650 mg via ORAL
  Filled 2014-09-04 (×3): qty 2

## 2014-09-04 MED ORDER — METHOCARBAMOL 500 MG PO TABS
500.0000 mg | ORAL_TABLET | Freq: Four times a day (QID) | ORAL | Status: DC | PRN
  Administered 2014-09-05 – 2014-09-10 (×3): 500 mg via ORAL
  Filled 2014-09-04 (×4): qty 1

## 2014-09-04 MED ORDER — FENTANYL CITRATE 0.05 MG/ML IJ SOLN
INTRAMUSCULAR | Status: AC
  Filled 2014-09-04: qty 2

## 2014-09-04 MED ORDER — PHENOL 1.4 % MT LIQD
1.0000 | OROMUCOSAL | Status: DC | PRN
  Filled 2014-09-04: qty 177

## 2014-09-04 MED ORDER — LACTATED RINGERS IV SOLN
INTRAVENOUS | Status: DC | PRN
  Administered 2014-09-04 (×3): via INTRAVENOUS

## 2014-09-04 MED ORDER — NITROGLYCERIN 0.4 MG SL SUBL
0.4000 mg | SUBLINGUAL_TABLET | SUBLINGUAL | Status: DC | PRN
  Administered 2014-09-04 – 2014-09-09 (×13): 0.4 mg via SUBLINGUAL
  Filled 2014-09-04 (×11): qty 1

## 2014-09-04 MED ORDER — SODIUM CHLORIDE 0.9 % IR SOLN
Status: DC | PRN
  Administered 2014-09-04: 2000 mL

## 2014-09-04 MED ORDER — AMLODIPINE BESYLATE 5 MG PO TABS
5.0000 mg | ORAL_TABLET | Freq: Every morning | ORAL | Status: DC
  Administered 2014-09-05 – 2014-09-11 (×7): 5 mg via ORAL
  Filled 2014-09-04 (×7): qty 1

## 2014-09-04 MED ORDER — SENNA 8.6 MG PO TABS
1.0000 | ORAL_TABLET | Freq: Two times a day (BID) | ORAL | Status: DC
  Administered 2014-09-04 – 2014-09-11 (×13): 8.6 mg via ORAL
  Filled 2014-09-04 (×12): qty 1

## 2014-09-04 MED ORDER — KETOROLAC TROMETHAMINE 15 MG/ML IJ SOLN
7.5000 mg | Freq: Four times a day (QID) | INTRAMUSCULAR | Status: AC
  Administered 2014-09-04 – 2014-09-05 (×4): 7.5 mg via INTRAVENOUS
  Filled 2014-09-04 (×3): qty 1

## 2014-09-04 MED ORDER — BUPIVACAINE-EPINEPHRINE (PF) 0.25% -1:200000 IJ SOLN
INTRAMUSCULAR | Status: AC
  Filled 2014-09-04: qty 30

## 2014-09-04 MED ORDER — ALBUMIN HUMAN 5 % IV SOLN
INTRAVENOUS | Status: AC
  Filled 2014-09-04: qty 250

## 2014-09-04 MED ORDER — SODIUM CHLORIDE 0.9 % IV SOLN: INTRAVENOUS | Status: DC

## 2014-09-04 MED ORDER — ISOPROPYL ALCOHOL 70 % SOLN
Status: DC | PRN
  Administered 2014-09-04: 1 via TOPICAL

## 2014-09-04 MED ORDER — ONDANSETRON HCL 4 MG PO TABS: 4.0000 mg | ORAL_TABLET | Freq: Four times a day (QID) | ORAL | Status: DC | PRN

## 2014-09-04 MED ORDER — KETOROLAC TROMETHAMINE 30 MG/ML IJ SOLN
INTRAMUSCULAR | Status: DC | PRN
  Administered 2014-09-04: 30 mg via INTRAMUSCULAR

## 2014-09-04 MED ORDER — FENTANYL CITRATE 0.05 MG/ML IJ SOLN
INTRAMUSCULAR | Status: DC | PRN
  Administered 2014-09-04: 50 ug via INTRAVENOUS
  Administered 2014-09-04: 25 ug via INTRAVENOUS
  Administered 2014-09-04: 75 ug via INTRAVENOUS

## 2014-09-04 MED ORDER — CHLORHEXIDINE GLUCONATE 4 % EX LIQD: 60.0000 mL | Freq: Once | CUTANEOUS | Status: DC

## 2014-09-04 MED ORDER — HYDROCODONE-ACETAMINOPHEN 5-325 MG PO TABS
1.0000 | ORAL_TABLET | ORAL | Status: DC | PRN
  Administered 2014-09-04 – 2014-09-06 (×3): 1 via ORAL
  Administered 2014-09-07 – 2014-09-11 (×3): 2 via ORAL
  Filled 2014-09-04: qty 2
  Filled 2014-09-04: qty 1
  Filled 2014-09-04: qty 2
  Filled 2014-09-04 (×2): qty 1
  Filled 2014-09-04: qty 2

## 2014-09-04 MED ORDER — EPHEDRINE SULFATE 50 MG/ML IJ SOLN
INTRAMUSCULAR | Status: DC | PRN
  Administered 2014-09-04: 10 mg via INTRAVENOUS
  Administered 2014-09-04: 5 mg via INTRAVENOUS
  Administered 2014-09-04: 10 mg via INTRAVENOUS
  Administered 2014-09-04: 5 mg via INTRAVENOUS
  Administered 2014-09-04: 10 mg via INTRAVENOUS

## 2014-09-04 MED ORDER — ONDANSETRON HCL 4 MG/2ML IJ SOLN: 4.0000 mg | Freq: Four times a day (QID) | INTRAMUSCULAR | Status: DC | PRN

## 2014-09-04 MED ORDER — MEMANTINE HCL ER 21 MG PO CP24
1.0000 | ORAL_CAPSULE | Freq: Every day | ORAL | Status: DC
  Administered 2014-09-05: 1 via ORAL
  Filled 2014-09-04 (×3): qty 1

## 2014-09-04 MED ORDER — SUCCINYLCHOLINE CHLORIDE 20 MG/ML IJ SOLN
INTRAMUSCULAR | Status: DC | PRN
  Administered 2014-09-04: 100 mg via INTRAVENOUS

## 2014-09-04 MED ORDER — VITAMIN D 1000 UNITS PO TABS
1000.0000 [IU] | ORAL_TABLET | Freq: Every morning | ORAL | Status: DC
  Administered 2014-09-05 – 2014-09-11 (×7): 1000 [IU] via ORAL
  Filled 2014-09-04 (×7): qty 1

## 2014-09-04 MED ORDER — KETOROLAC TROMETHAMINE 30 MG/ML IJ SOLN
INTRAMUSCULAR | Status: AC
  Filled 2014-09-04: qty 1

## 2014-09-04 MED ORDER — BUPIVACAINE-EPINEPHRINE 0.25% -1:200000 IJ SOLN
INTRAMUSCULAR | Status: DC | PRN
  Administered 2014-09-04: 30 mL

## 2014-09-04 MED ORDER — METOCLOPRAMIDE HCL 5 MG/ML IJ SOLN: 5.0000 mg | Freq: Three times a day (TID) | INTRAMUSCULAR | Status: DC | PRN

## 2014-09-04 MED ORDER — CEFAZOLIN SODIUM-DEXTROSE 2-3 GM-% IV SOLR
2.0000 g | INTRAVENOUS | Status: AC
  Administered 2014-09-04: 2 g via INTRAVENOUS

## 2014-09-04 MED ORDER — CLOPIDOGREL BISULFATE 75 MG PO TABS
75.0000 mg | ORAL_TABLET | Freq: Every morning | ORAL | Status: DC
  Administered 2014-09-05 – 2014-09-11 (×7): 75 mg via ORAL
  Filled 2014-09-04 (×7): qty 1

## 2014-09-04 MED ORDER — PROPOFOL 10 MG/ML IV BOLUS
INTRAVENOUS | Status: DC | PRN
  Administered 2014-09-04: 150 mg via INTRAVENOUS

## 2014-09-04 MED ORDER — MORPHINE SULFATE 10 MG/ML IJ SOLN: 2.0000 mg | INTRAMUSCULAR | Status: DC | PRN

## 2014-09-04 MED ORDER — CEFAZOLIN SODIUM-DEXTROSE 2-3 GM-% IV SOLR
INTRAVENOUS | Status: AC
Start: 2014-09-04 — End: 2014-09-04
  Filled 2014-09-04: qty 50

## 2014-09-04 MED ORDER — CEFAZOLIN SODIUM 1-5 GM-% IV SOLN
1.0000 g | Freq: Four times a day (QID) | INTRAVENOUS | Status: AC
  Administered 2014-09-04 – 2014-09-05 (×2): 1 g via INTRAVENOUS
  Filled 2014-09-04 (×2): qty 50

## 2014-09-04 MED ORDER — METOPROLOL TARTRATE 25 MG PO TABS
75.0000 mg | ORAL_TABLET | Freq: Two times a day (BID) | ORAL | Status: DC
  Administered 2014-09-04 – 2014-09-07 (×7): 75 mg via ORAL
  Filled 2014-09-04 (×2): qty 1
  Filled 2014-09-04 (×3): qty 3
  Filled 2014-09-04: qty 1
  Filled 2014-09-04 (×2): qty 3
  Filled 2014-09-04 (×2): qty 1

## 2014-09-04 MED ORDER — SODIUM CHLORIDE 0.9 % IJ SOLN
INTRAMUSCULAR | Status: AC
  Filled 2014-09-04: qty 50

## 2014-09-04 MED ORDER — DOCUSATE SODIUM 100 MG PO CAPS
100.0000 mg | ORAL_CAPSULE | Freq: Two times a day (BID) | ORAL | Status: DC
  Administered 2014-09-04 – 2014-09-11 (×14): 100 mg via ORAL
  Filled 2014-09-04 (×13): qty 1

## 2014-09-04 MED ORDER — MENTHOL 3 MG MT LOZG
1.0000 | LOZENGE | OROMUCOSAL | Status: DC | PRN
  Filled 2014-09-04: qty 9

## 2014-09-04 MED ORDER — ROCURONIUM BROMIDE 100 MG/10ML IV SOLN
INTRAVENOUS | Status: DC | PRN
  Administered 2014-09-04 (×2): 10 mg via INTRAVENOUS
  Administered 2014-09-04: 40 mg via INTRAVENOUS

## 2014-09-04 MED ORDER — DEXAMETHASONE SODIUM PHOSPHATE 10 MG/ML IJ SOLN
INTRAMUSCULAR | Status: AC
Start: 2014-09-04 — End: 2014-09-04
  Filled 2014-09-04: qty 1

## 2014-09-04 MED ORDER — 0.9 % SODIUM CHLORIDE (POUR BTL) OPTIME
TOPICAL | Status: DC | PRN
  Administered 2014-09-04: 1000 mL

## 2014-09-04 MED ORDER — ROCURONIUM BROMIDE 100 MG/10ML IV SOLN
INTRAVENOUS | Status: AC
  Filled 2014-09-04: qty 1

## 2014-09-04 MED ORDER — ISOPROPYL ALCOHOL 70 % SOLN
Status: AC
  Filled 2014-09-04: qty 480

## 2014-09-04 MED ORDER — SODIUM CHLORIDE 0.9 % IV SOLN
1000.0000 mg | INTRAVENOUS | Status: DC
  Filled 2014-09-04: qty 10

## 2014-09-04 MED ORDER — GLYCOPYRROLATE 0.2 MG/ML IJ SOLN
INTRAMUSCULAR | Status: DC | PRN
  Administered 2014-09-04: 0.4 mg via INTRAVENOUS

## 2014-09-04 MED ORDER — ALBUMIN HUMAN 5 % IV SOLN
INTRAVENOUS | Status: DC | PRN
  Administered 2014-09-04: 15:00:00 via INTRAVENOUS

## 2014-09-04 MED ORDER — ONDANSETRON HCL 4 MG/2ML IJ SOLN: 4.0000 mg | Freq: Once | INTRAMUSCULAR | Status: DC | PRN

## 2014-09-04 MED ORDER — FENTANYL CITRATE 0.05 MG/ML IJ SOLN
25.0000 ug | INTRAMUSCULAR | Status: DC | PRN
  Administered 2014-09-04: 50 ug via INTRAVENOUS

## 2014-09-04 MED ORDER — NEOSTIGMINE METHYLSULFATE 10 MG/10ML IV SOLN
INTRAVENOUS | Status: DC | PRN
Start: 2014-09-04 — End: 2014-09-04
  Administered 2014-09-04: 3 mg via INTRAVENOUS

## 2014-09-04 MED ORDER — FENTANYL CITRATE 0.05 MG/ML IJ SOLN
INTRAMUSCULAR | Status: AC
Start: 2014-09-04 — End: 2014-09-05
  Filled 2014-09-04: qty 2

## 2014-09-04 MED ORDER — HYDROGEN PEROXIDE 3 % EX SOLN
CUTANEOUS | Status: AC
  Filled 2014-09-04: qty 473

## 2014-09-04 MED ORDER — LACTATED RINGERS IV SOLN
INTRAVENOUS | Status: DC
  Administered 2014-09-04 – 2014-09-05 (×2): via INTRAVENOUS

## 2014-09-04 MED ORDER — TRANEXAMIC ACID 100 MG/ML IV SOLN
2000.0000 mg | Freq: Once | INTRAVENOUS | Status: DC
  Filled 2014-09-04: qty 20

## 2014-09-04 MED ORDER — ONDANSETRON HCL 4 MG/2ML IJ SOLN
INTRAMUSCULAR | Status: DC | PRN
  Administered 2014-09-04 (×2): 4 mg via INTRAVENOUS

## 2014-09-04 MED ORDER — SODIUM CHLORIDE 0.9 % IR SOLN
Status: DC | PRN
  Administered 2014-09-04: 1000 mL

## 2014-09-04 MED ORDER — ACETAMINOPHEN 10 MG/ML IV SOLN
1000.0000 mg | Freq: Once | INTRAVENOUS | Status: AC
  Administered 2014-09-04: 1000 mg via INTRAVENOUS
  Filled 2014-09-04: qty 100

## 2014-09-04 MED ORDER — TRANEXAMIC ACID 100 MG/ML IV SOLN: 2000.0000 mg | Freq: Once | INTRAVENOUS | Status: DC

## 2014-09-04 MED ORDER — METOCLOPRAMIDE HCL 5 MG PO TABS
5.0000 mg | ORAL_TABLET | Freq: Three times a day (TID) | ORAL | Status: DC | PRN
  Filled 2014-09-04: qty 2

## 2014-09-04 MED ORDER — ONDANSETRON HCL 4 MG/2ML IJ SOLN
INTRAMUSCULAR | Status: AC
  Filled 2014-09-04: qty 2

## 2014-09-04 SURGICAL SUPPLY — 43 items
BAG ZIPLOCK 12X15 (MISCELLANEOUS) IMPLANT
CAPT HIP TOTAL 2 ×3 IMPLANT
CHLORAPREP W/TINT 26ML (MISCELLANEOUS) ×3 IMPLANT
COVER PERINEAL POST (MISCELLANEOUS) ×3 IMPLANT
DRAPE C-ARM 42X120 X-RAY (DRAPES) ×3 IMPLANT
DRAPE STERI IOBAN 125X83 (DRAPES) ×3 IMPLANT
DRAPE U-SHAPE 47X51 STRL (DRAPES) ×9 IMPLANT
DRSG AQUACEL AG ADV 3.5X10 (GAUZE/BANDAGES/DRESSINGS) ×3 IMPLANT
ELECT BLADE TIP CTD 4 INCH (ELECTRODE) ×3 IMPLANT
ELECT PENCIL ROCKER SW 15FT (MISCELLANEOUS) ×3 IMPLANT
ELECT REM PT RETURN 15FT ADLT (MISCELLANEOUS) IMPLANT
ELECT REM PT RETURN 9FT ADLT (ELECTROSURGICAL) ×3
ELECTRODE REM PT RTRN 9FT ADLT (ELECTROSURGICAL) ×1 IMPLANT
FACESHIELD WRAPAROUND (MASK) ×9 IMPLANT
GLOVE BIO SURGEON STRL SZ8.5 (GLOVE) ×6 IMPLANT
GLOVE BIOGEL PI IND STRL 8.5 (GLOVE) ×1 IMPLANT
GLOVE BIOGEL PI INDICATOR 8.5 (GLOVE) ×2
GOWN SPEC L3 XXLG W/TWL (GOWN DISPOSABLE) ×3 IMPLANT
HANDPIECE INTERPULSE COAX TIP (DISPOSABLE) ×2
HOLDER FOLEY CATH W/STRAP (MISCELLANEOUS) ×3 IMPLANT
HOOD PEEL AWAY FACE SHEILD DIS (HOOD) ×6 IMPLANT
KIT BASIN OR (CUSTOM PROCEDURE TRAY) ×3 IMPLANT
LIQUID BAND (GAUZE/BANDAGES/DRESSINGS) ×6 IMPLANT
NDL SAFETY ECLIPSE 18X1.5 (NEEDLE) ×2 IMPLANT
NEEDLE HYPO 18GX1.5 SHARP (NEEDLE) ×4
NEEDLE SPNL 18GX3.5 QUINCKE PK (NEEDLE) ×3 IMPLANT
PACK TOTAL JOINT (CUSTOM PROCEDURE TRAY) ×3 IMPLANT
SAW OSC TIP CART 19.5X105X1.3 (SAW) ×3 IMPLANT
SEALER BIPOLAR AQUA 6.0 (INSTRUMENTS) ×3 IMPLANT
SET HNDPC FAN SPRY TIP SCT (DISPOSABLE) ×1 IMPLANT
SOL PREP POV-IOD 4OZ 10% (MISCELLANEOUS) ×3 IMPLANT
SUT ETHIBOND NAB CT1 #1 30IN (SUTURE) ×6 IMPLANT
SUT MNCRL AB 3-0 PS2 18 (SUTURE) ×3 IMPLANT
SUT MON AB 2-0 SH 27 (SUTURE) ×4
SUT MON AB 2-0 SH27 (SUTURE) ×2 IMPLANT
SUT VIC AB 1 CT1 36 (SUTURE) ×3 IMPLANT
SUT VIC AB 2-0 CT1 27 (SUTURE) ×2
SUT VIC AB 2-0 CT1 TAPERPNT 27 (SUTURE) ×1 IMPLANT
SUT VLOC 180 0 24IN GS25 (SUTURE) ×3 IMPLANT
SYR 50ML LL SCALE MARK (SYRINGE) ×6 IMPLANT
TOWEL OR 17X26 10 PK STRL BLUE (TOWEL DISPOSABLE) ×3 IMPLANT
TRAY FOLEY CATH 16FRSI W/METER (SET/KITS/TRAYS/PACK) ×3 IMPLANT
WATER STERILE IRR 1500ML POUR (IV SOLUTION) ×3 IMPLANT

## 2014-09-04 NOTE — Op Note (Signed)
OPERATIVE REPORT  SURGEON: Rod Can, MD   ASSISTANT: Molli Barrows, PA-C  PREOPERATIVE DIAGNOSIS: Left hip arthritis.   POSTOPERATIVE DIAGNOSIS: Left hip arthritis.   PROCEDURE: Left total hip arthroplasty, anterior approach.   IMPLANTS: DePuy Tri Lock stem, size 6, hi offset. DePuy Pinnacle Cup, size 60 mm. DePuy Altrx liner, size 60 by 36 mm. DePuy Biolox ceramic head ball, size 36 + 1.5 mm.  ANESTHESIA:  General  ESTIMATED BLOOD LOSS: 350 cc.  ANTIBIOTICS: 2g ancef.  DRAINS: None.  COMPLICATIONS: None   CONDITION: PACU - hemodynamically stable.   BRIEF CLINICAL NOTE: [name] is a 81 y.o. male with a long-standing history of Left hip arthritis. After failing conservative management, the patient was indicated for total hip arthroplasty. The risks, benefits, and alternatives to the procedure were explained, and the patient elected to proceed.  PROCEDURE IN DETAIL: Surgical site was marked by myself. Once inside the operative room, general anesthesia was induced, and a foley catheter was inserted. The patient was then positioned on the Hana table. All bony prominences were well padded. The hip was prepped and draped in the normal sterile surgical fashion. A time-out was called verifying side and site of surgery. The patient received IV antibiotics within 60 minutes of beginning the procedure.  The direct anterior approach to the hip was performed through the Hueter interval. Lateral femoral circumflex vessels were treated with the Auqumantys. The anterior capsule was exposed and an inverted T capsulotomy was made.The femoral neck cut was made to the level of the templated cut. A corkscrew was placed into the head and the head was removed. The femoral head was found to have eburnated bone. The head was passed to the back table and was measured.  Acetabular exposure was achieved, and the pulvinar and labrum were excised. Sequental reaming of the acetabulum  was then performed up to a size 59 mm reamer. A 60 mm cup was then opened and impacted into place at approximately 40 degrees of abduction and 20 degrees of anteversion. The final polyethylene liner was impacted into place and acetabular osteophytes were removed.   I then gained femoral exposure taking care to protect the abductors and greater trochanter. This was performed using standard external rotation, extension, and adduction. The capsule was peeled off the inner aspect of the greater trochanter, taking care to preserve the short external rotators. A cookie cutter was used to enter the femoral canal, and then the femoral canal finder was placed. Sequential broaching was performed up to a size 6. Calcar planer was used on the femoral neck remnant. I paced a hi offset neck and a trial head ball. The hip was reduced. Leg lengths and offset were checked fluoroscopically. The hip was dislocated and trial components were removed. The final implants were placed, and the hip was reduced.  Fluoroscopy was used to confirm component position and leg lengths. At 90 degrees of external rotation and full extension, the hip was stable to an anterior directed force.  The wound was copiously irrigated with a dilute betadine solution followed by normal saline. Marcaine solution was injected into the periarticular soft tissue. The wound was closed in layers using #1 Vicryl and V-Loc for the fascia, 2-0 Vicryl for the subcutaneous fat, 2-0 Monocryl for the deep dermal layer, 3-0 running Monocryl subcuticular stitch, and Dermabond for the skin. Once the glue was fully dried, an Aquacell Ag dressing was applied. The patient was transported to the recovery room in stable ondition. Sponge, needle,  and instrument counts were correct at the end of the case x2. The patient tolerated the procedure well and there were no known complications.  Please note that a surgical assistant was a medical necessity for this  procedure to perform it in a safe and expeditious manner. Assistant was necessary to provide appropriate retraction of vital neurovascular structures, to prevent femoral fracture, and to allow for anatomic placement of the prosthesis.

## 2014-09-04 NOTE — Interval H&P Note (Signed)
History and Physical Interval Note:  09/04/2014 12:48 PM  [name]  has presented today for surgery, with the diagnosis of LEFT HIP OA  The various methods of treatment have been discussed with the patient and family. After consideration of risks, benefits and other options for treatment, the patient has consented to  Procedure(s): LEFT TOTAL HIP ARTHROPLASTY ANTERIOR APPROACH (Left) as a surgical intervention .  The patient's history has been reviewed, patient examined, no change in status, stable for surgery.  I have reviewed the patient's chart and labs.  Questions were answered to the patient's satisfaction.     Elsi Stelzer, Horald Pollen

## 2014-09-04 NOTE — Addendum Note (Signed)
Addendum  created 09/04/14 1707 by Lavina Hamman, CRNA   Modules edited: Charges VN

## 2014-09-04 NOTE — Progress Notes (Signed)
Pt has new light red bloody discharge from rectum.  Stopped plavix as ordered preop.  Has taken Nitrostat in past 2 weeks. Drs Lyla Glassing and Tamala Julian made aware.

## 2014-09-04 NOTE — Anesthesia Preprocedure Evaluation (Addendum)
Anesthesia Evaluation  Patient identified by MRN, date of birth, ID band Patient awake    Reviewed: Allergy & Precautions, NPO status , Patient's Chart, lab work & pertinent test results  Airway        Dental   Pulmonary former smoker,          Cardiovascular + CAD, + Cardiac Stents and + CABG     Neuro/Psych  Neuromuscular disease    GI/Hepatic   Endo/Other    Renal/GU      Musculoskeletal   Abdominal   Peds  Hematology   Anesthesia Other Findings Hx colon CA   Reproductive/Obstetrics                            Anesthesia Physical Anesthesia Plan  ASA: III  Anesthesia Plan: General   Post-op Pain Management:    Induction: Intravenous  Airway Management Planned: Oral ETT  Additional Equipment:   Intra-op Plan:   Post-operative Plan: Extubation in OR  Informed Consent: I have reviewed the patients History and Physical, chart, labs and discussed the procedure including the risks, benefits and alternatives for the proposed anesthesia with the patient or authorized representative who has indicated his/her understanding and acceptance.   Dental advisory given  Plan Discussed with: CRNA, Anesthesiologist and Surgeon  Anesthesia Plan Comments:         Anesthesia Quick Evaluation

## 2014-09-04 NOTE — Progress Notes (Signed)
Utilization review completed.  

## 2014-09-04 NOTE — Anesthesia Postprocedure Evaluation (Signed)
  Anesthesia Post-op Note  Patient: [name]  Procedure(s) Performed: Procedure(s): LEFT TOTAL HIP ARTHROPLASTY ANTERIOR APPROACH (Left)  Patient Location: PACU  Anesthesia Type:General  Level of Consciousness: awake, alert , oriented and patient cooperative  Airway and Oxygen Therapy: Patient Spontanous Breathing  Post-op Pain: none  Post-op Assessment: Post-op Vital signs reviewed, Patient's Cardiovascular Status Stable, Respiratory Function Stable, Patent Airway, No signs of Nausea or vomiting and Pain level controlled  Post-op Vital Signs: stable  Last Vitals:  Filed Vitals:   09/04/14 1630  BP: 105/49  Pulse: 58  Temp:   Resp: 13    Complications: No apparent anesthesia complications

## 2014-09-04 NOTE — Transfer of Care (Signed)
Immediate Anesthesia Transfer of Care Note  Patient: [name]  Procedure(s) Performed: Procedure(s): LEFT TOTAL HIP ARTHROPLASTY ANTERIOR APPROACH (Left)  Patient Location: PACU  Anesthesia Type:General  Level of Consciousness: awake, alert  and oriented  Airway & Oxygen Therapy: Patient Spontanous Breathing and Patient connected to face mask oxygen  Post-op Assessment: Report given to RN  Post vital signs: Reviewed and stable  Last Vitals:  Filed Vitals:   09/04/14 1043  BP: 129/67  Pulse: 52  Temp: 36.3 C  Resp: 16    Complications: No apparent anesthesia complications

## 2014-09-05 ENCOUNTER — Encounter (HOSPITAL_COMMUNITY): Payer: Self-pay | Admitting: Orthopedic Surgery

## 2014-09-05 DIAGNOSIS — R7989 Other specified abnormal findings of blood chemistry: Secondary | ICD-10-CM | POA: Diagnosis present

## 2014-09-05 DIAGNOSIS — Z951 Presence of aortocoronary bypass graft: Secondary | ICD-10-CM

## 2014-09-05 DIAGNOSIS — I209 Angina pectoris, unspecified: Secondary | ICD-10-CM

## 2014-09-05 DIAGNOSIS — R778 Other specified abnormalities of plasma proteins: Secondary | ICD-10-CM | POA: Diagnosis present

## 2014-09-05 LAB — TROPONIN I
TROPONIN I: 0.76 ng/mL — AB (ref <0.031)
Troponin I: 0.12 ng/mL — ABNORMAL HIGH (ref <0.031)
Troponin I: 0.26 ng/mL — ABNORMAL HIGH (ref <0.031)
Troponin I: 0.49 ng/mL — ABNORMAL HIGH (ref <0.031)

## 2014-09-05 LAB — BASIC METABOLIC PANEL
Anion gap: 9 (ref 5–15)
BUN: 20 mg/dL (ref 6–23)
CO2: 23 mmol/L (ref 19–32)
Calcium: 8.2 mg/dL — ABNORMAL LOW (ref 8.4–10.5)
Chloride: 109 mmol/L (ref 96–112)
Creatinine, Ser: 1.46 mg/dL — ABNORMAL HIGH (ref 0.50–1.35)
GFR calc Af Amer: 49 mL/min — ABNORMAL LOW (ref >90)
GFR calc non Af Amer: 42 mL/min — ABNORMAL LOW (ref >90)
GLUCOSE: 149 mg/dL — AB (ref 70–99)
Potassium: 4 mmol/L (ref 3.5–5.1)
Sodium: 141 mmol/L (ref 135–145)

## 2014-09-05 LAB — CBC WITH DIFFERENTIAL/PLATELET
BASOS PCT: 0 % (ref 0–1)
Basophils Absolute: 0 10*3/uL (ref 0.0–0.1)
EOS ABS: 0 10*3/uL (ref 0.0–0.7)
EOS PCT: 0 % (ref 0–5)
HCT: 28 % — ABNORMAL LOW (ref 39.0–52.0)
HEMOGLOBIN: 9.1 g/dL — AB (ref 13.0–17.0)
LYMPHS ABS: 1.6 10*3/uL (ref 0.7–4.0)
Lymphocytes Relative: 10 % — ABNORMAL LOW (ref 12–46)
MCH: 28.4 pg (ref 26.0–34.0)
MCHC: 32.5 g/dL (ref 30.0–36.0)
MCV: 87.5 fL (ref 78.0–100.0)
Monocytes Absolute: 2.1 10*3/uL — ABNORMAL HIGH (ref 0.1–1.0)
Monocytes Relative: 13 % — ABNORMAL HIGH (ref 3–12)
NEUTROS ABS: 12.6 10*3/uL — AB (ref 1.7–7.7)
NEUTROS PCT: 77 % (ref 43–77)
Platelets: 112 10*3/uL — ABNORMAL LOW (ref 150–400)
RBC: 3.2 MIL/uL — AB (ref 4.22–5.81)
RDW: 15.7 % — ABNORMAL HIGH (ref 11.5–15.5)
WBC: 16.3 10*3/uL — ABNORMAL HIGH (ref 4.0–10.5)

## 2014-09-05 LAB — CBC
HEMATOCRIT: 29 % — AB (ref 39.0–52.0)
Hemoglobin: 9.8 g/dL — ABNORMAL LOW (ref 13.0–17.0)
MCH: 28.8 pg (ref 26.0–34.0)
MCHC: 33.8 g/dL (ref 30.0–36.0)
MCV: 85.3 fL (ref 78.0–100.0)
Platelets: 105 10*3/uL — ABNORMAL LOW (ref 150–400)
RBC: 3.4 MIL/uL — ABNORMAL LOW (ref 4.22–5.81)
RDW: 15.5 % (ref 11.5–15.5)
WBC: 22.7 10*3/uL — ABNORMAL HIGH (ref 4.0–10.5)

## 2014-09-05 LAB — SODIUM, URINE, RANDOM: SODIUM UR: 76 meq/L

## 2014-09-05 LAB — CREATININE, URINE, RANDOM: CREATININE, URINE: 206.2 mg/dL

## 2014-09-05 MED ORDER — NITROGLYCERIN IN D5W 200-5 MCG/ML-% IV SOLN
0.0000 ug/min | INTRAVENOUS | Status: DC
  Administered 2014-09-05: 5 ug/min via INTRAVENOUS
  Administered 2014-09-06: 35 ug/min via INTRAVENOUS
  Filled 2014-09-05 (×2): qty 250

## 2014-09-05 MED ORDER — SODIUM CHLORIDE 0.9 % IV SOLN
INTRAVENOUS | Status: AC
  Administered 2014-09-05: 10:00:00 via INTRAVENOUS

## 2014-09-05 MED ORDER — NITROGLYCERIN 2 % TD OINT
1.0000 [in_us] | TOPICAL_OINTMENT | Freq: Four times a day (QID) | TRANSDERMAL | Status: DC
  Administered 2014-09-05: 1 [in_us] via TOPICAL
  Filled 2014-09-05: qty 30

## 2014-09-05 NOTE — Care Management Note (Signed)
    Page 1 of 2   09/08/2014     4:15:37 PM CARE MANAGEMENT NOTE 09/08/2014  Patient:  [name], [name]   Account Number:  [id number]  Date Initiated:  09/05/2014  Documentation initiated by:  [facility]  Subjective/Objective Assessment:   adm: Left total hip arthroplasty, anterior approach.     Action/Plan:   discharge planning   Anticipated DC Date:  09/10/2014   Anticipated DC Plan:  [place]  CM consult      Choice offered to / List presented to:  C-1 Patient   DME arranged  [place]      DME agency  [place] arranged  HH-1 RN  [place] agency  [facility]   Status of service:  In process, will continue to follow Medicare Important Message given?   (If response is "NO", the following Medicare IM given date fields will be blank) Date Medicare IM given:   Medicare IM given by:   Date Additional Medicare IM given:   Additional Medicare IM given by:    Discharge Disposition:    Per UR Regulation:  Reviewed for med. necessity/level of care/duration of stay  If discussed at [place] of Stay Meetings, dates discussed:    Comments:  09/08/14 Dessa Phi RN BSN NCM [address] [place] following. h[facility] orders already placed. dme home orders placed-[facility] dme rep Lecretia aware.  09/07/2014 1800 HH arranged with [facility]. Waiting final recommendations for home. Jonnie Finner RN CCM Case Mgmt phone [phone]   09/05/14 08:00 CM met with pt in room to confirm [facility] as home health choice for HHPT.  Pt very confused.  RN aware. CM will follow at a later time to discuss disposition. Mariane Masters, BSN, CM [phone].

## 2014-09-05 NOTE — Progress Notes (Signed)
OT Cancellation Note  Patient Details Name: KEEFER SOULLIERE MRN: [id number] DOB: 1926-07-19   Cancelled Treatment:    Reason Eval/Treat Not Completed: Other (comment).  Noted troponins trending up.  Will check back.  Avian Konigsberg 09/05/2014, 9:13 AM  Lesle Chris, OTR/L [phone] 09/05/2014

## 2014-09-05 NOTE — Progress Notes (Signed)
Transferred patient to stepdown after having episode of severe  Left chest pain, relieved by nitroglycerine SL tab x3. Cardiologist Dr. Clayborne Artist was made aware. Ordered to start nitro drip. Vital signs remained stable. Pain level right now is 3/10.

## 2014-09-05 NOTE — Progress Notes (Signed)
Dr Olevia Bowens paged with troponin results, made aware of patient having angina pain, relieved by nitro  Orders received D Mateo Flow RN

## 2014-09-05 NOTE — Progress Notes (Signed)
Report called to natasha RN patient transferring tom [place]

## 2014-09-05 NOTE — Progress Notes (Signed)
Pt complains of pain in his shoulders and left arm. Notified rapid response nurse for evaluation. Vital signs are stable. EKG done. Called on-call PA. Troponin lab ordered. Will continue to monitor.

## 2014-09-05 NOTE — Consult Note (Addendum)
[facility] Medical Consultation  [name] ZOX:[id number] DOB: 25-Mar-1927 DOA: 09/04/2014 PCP: Precious Reel, MD   Requesting physician: Dr. Lyla Glassing Date of consultation: 3.4.2016 Reason for consultation: Angina pectoris  Impression/Recommendations Osteoarthritis of left hip - Management per orthopedic status post a hip replacement on 09/04/2014. - Pain is control having regular bowel movements.  Unstable angina pectoris/ Elevated troponin/S/P CABG (coronary artery bypass graft): - He relates he gets these chest pains/shoulder pains occasionally when he is out walking or when he's out in the cold weather, a resolve after he comes in from the cold weather or after nitroglycerin. - Had episode of chest pain overnight resolved after his third nitroglycerin initial cardiac enzymes were done which were mildly elevated, his EKG is unchanged from the preop EKG. his hemoglobin dropped from 13.5-9.8 which is probably contributing anginal. - He's had 2 episodes of chest pain while laying in bed, Continue aspirin, Plavix and metoprolol. Continue to cycle troponins. - Consult cardiology.  AKI: - Baseline creatinine less than 1.0. - likely pre-renal, check Urine Sodium and cr. - start IV fluids. On no nephrotoxic medications.  Leukocytosis: - His hemoglobin dropped from 13-9.8, and he also has an elevated white count of 22. He has remained afebrile. - I will go ahead and consult cardiology. - Check a CBC in the morning.   I will followup again tomorrow. Please contact me if I can be of assistance in the meanwhile. Thank you for this consultation.  Chief Complaint: Shoulder pain  HPI:  There is an 81 year old with past medical history of coronary artery C status post CABG/PCI and redo CABG, repeated PCI with stenting and chronic stable angina for the past year described as shoulder pain that comes it for hip replacement due to osteoarthritis of the left hip status post hip replacement  on 09/04/2014 on aspirin, Plavix and metoprolol the night after surgery he started having shoulder pain that was relieved after his third nitroglycerin. EKG was done which is unchanged from previous, his first set of troponins was positive so we were consulted for further evaluation. He relates it feels like his regular angina, his angina most of the time as a bifascicular third nitroglycerin. Tolerating his diet eating and laying comfortably in bed.   Review of Systems:  Constitutional:  No weight loss, night sweats, Fevers, chills, fatigue.  HEENT:  No headaches, Difficulty swallowing,Tooth/dental problems,Sore throat,  No sneezing, itching, ear ache, nasal congestion, post nasal drip,  Cardio-vascular:  No chest pain, Orthopnea, PND, swelling in lower extremities, anasarca, dizziness, palpitations  GI:  No heartburn, indigestion, abdominal pain, nausea, vomiting, diarrhea, change in bowel habits, loss of appetite  Resp:  No shortness of breath with exertion or at rest. No excess mucus, no productive cough, No non-productive cough, No coughing up of blood.No change in color of mucus.No wheezing.No chest wall deformity  Skin:  no rash or lesions.  GU:  no dysuria, change in color of urine, no urgency or frequency. No flank pain.  Musculoskeletal:  No joint pain or swelling. No decreased range of motion. No back pain.  Psych:  No change in mood or affect. No depression or anxiety. No memory loss.   Past Medical History  Diagnosis Date  . H/O prostate cancer     Prior cryoablation   . Hyperlipidemia 06/26/2012  . S/P CABG (coronary artery bypass graft)   . Villous adenoma of rectum   . Anxiety   . Cancer     HX PROSTATE CANCER TREATED  WITH CRYOABLATION; HX OF VILLOUS ADENOMA / PARTIAL COLECTOMY  . Pain     SEVERE BACK PAIN WITH NUMBNESS LOWER EXTREMITIES - HAS SPINAL STENOSIS  . Anginal pain     MILD CHEST PAIN - OCCASIONAL NTG  . CAD (coronary artery disease) 06/26/2012    PTCA  RCA 1995 CABG w. LIMA to LAD, SVG to dx 1, PD and PL March 2000 Dr. Servando Snare for 3VD Cypher stent SVG to RCA  2005 redo CABG 07/2005 with SVG to OM and SVG to PD-PL   DR. TILLEY IS PT'S CARDIOLOGIST  . Memory difficulties   . Hearing loss     bilateral  . Arthritis    Past Surgical History  Procedure Laterality Date  . Coronary artery bypass graft  2000, 2007    LIMA and SVGs to Diag 1 PDA PLR March 2000,  SVG to OM and SVG to PDA-PL 07/2005  . Partial colectomy  2007  . Prostate cryoablation    . Hernia repair    . Nasal septum surgery    . Lumbar laminectomy/decompression microdiscectomy N/A 02/19/2014    Procedure: MICRO LUMBAR DECOMPRESSION L4-5/L3-4;  Surgeon: Johnn Hai, MD;  Location: [facility] ORS;  Service: Orthopedics;  Laterality: N/A;  . Left heart catheterization with coronary/graft angiogram N/A 06/29/2012    Procedure: LEFT HEART CATHETERIZATION WITH Beatrix Fetters;  Surgeon: Sinclair Grooms, MD;  Location: [facility] CATH LAB;  Service: Cardiovascular;  Laterality: N/A;  . Percutaneous coronary stent intervention (pci-s) N/A 07/03/2012    Procedure: PERCUTANEOUS CORONARY STENT INTERVENTION (PCI-S);  Surgeon: Sinclair Grooms, MD;  Location: [facility] CATH LAB;  Service: Cardiovascular;  Laterality: N/A;  . Left heart catheterization with coronary/graft angiogram N/A 02/28/2013    Procedure: LEFT HEART CATHETERIZATION WITH Beatrix Fetters;  Surgeon: Jacolyn Reedy, MD;  Location: [facility] C[facility] LAB;  Service: Cardiovascular;  Laterality: N/A;  . Cataract Bilateral     08-13-14   Social History:  reports that he has quit smoking. He has never used smokeless tobacco. He reports that he drinks about 0.6 oz of alcohol per week. He reports that he does not use illicit drugs.  Allergies  Allergen Reactions  . Dilaudid [Hydromorphone Hcl]     Prefers not to have"causes paranoia"  . Fenofibrate Other (See Comments)    Reaction unknown  . Gemfibrozil Other (See Comments)     Reaction unknown  . Ranexa [Ranolazine] Other (See Comments)    Reaction unknown   . Red Yeast Rice [Cholestin] Other (See Comments)    Reaction unknown   . Statins Other (See Comments)    Muscle pain  . Zetia [Ezetimibe] Other (See Comments)    Reaction unknown    Family History  Problem Relation Age of Onset  . Prostate cancer Father   . Lung cancer Mother   . CVA Brother   . Brain cancer Daughter     Prior to Admission medications   Medication Sig Start Date End Date Taking? Authorizing Provider  amLODipine (NORVASC) 5 MG tablet Take 5 mg by mouth every morning.    Yes Historical Provider, MD  bimatoprost (LUMIGAN) 0.03 % ophthalmic solution Place 1 drop into both eyes at bedtime.    Yes Historical Provider, MD  cholecalciferol (VITAMIN D) 1000 UNITS tablet Take 1,000 Units by mouth every morning.    Yes Historical Provider, MD  clopidogrel (PLAVIX) 75 MG tablet Take 75 mg by mouth every morning.    Yes Historical Provider, MD  finasteride (  PROSCAR) 5 MG tablet Take 5 mg by mouth every morning.    Yes Historical Provider, MD  GARLIC PO Take 1 tablet by mouth every morning.    Yes Historical Provider, MD  Ibuprofen-Diphenhydramine Cit 200-38 MG TABS Take 1 tablet by mouth at bedtime as needed.   Yes Historical Provider, MD  isosorbide mononitrate (IMDUR) 120 MG 24 hr tablet Take 120 mg by mouth daily with lunch.    Yes Historical Provider, MD  Memantine HCl ER 21 MG CP24 Take 1 capsule by mouth daily.   Yes Historical Provider, MD  metoprolol (LOPRESSOR) 50 MG tablet Take 75 mg by mouth 2 (two) times daily.   Yes Historical Provider, MD  OVER THE COUNTER MEDICATION Take 1 capsule by mouth every morning. Mega Red Omega-3 Krill Oil   Yes Historical Provider, MD  Timolol Maleate (ISTALOL) 0.5 % (DAILY) SOLN Place 1 drop into both eyes every morning.    Yes Historical Provider, MD  docusate sodium (COLACE) 100 MG capsule Take 1 capsule (100 mg total) by mouth 2 (two) times daily as  needed for mild constipation. Patient not taking: Reported on 08/27/2014 02/19/14   Johnn Hai, MD  HYDROcodone-acetaminophen (NORCO/VICODIN) 5-325 MG per tablet Take 1 tablet by mouth every 4 (four) hours as needed. Patient not taking: Reported on 08/27/2014 02/19/14   Johnn Hai, MD  ibuprofen (ADVIL,MOTRIN) 200 MG tablet Take 400-600 mg by mouth every 6 (six) hours as needed for mild pain or moderate pain.    Historical Provider, MD  nitroGLYCERIN (NITROSTAT) 0.4 MG SL tablet Place 0.4 mg under the tongue every 5 (five) minutes as needed for chest pain.     Historical Provider, MD   Physical Exam: Blood pressure 137/63, pulse 91, temperature 98.2 F (36.8 C), temperature source Oral, resp. rate 16, height 5\' 7"  (1.702 m), weight 78.472 kg (173 lb), SpO2 96 %. Filed Vitals:   09/05/14 0506  BP: 137/63  Pulse: 91  Temp: 98.2 F (36.8 C)  Resp: 16     General:  Awake alert and oriented 3 in no acute distress  Eyes: Anicteric no pallor  ENT: Moist mucous membrane  Neck: Midline trachea no JVD  Cardiovascular: Regular rate and rhythm  Respiratory: Good air movement clear to auscultation  Abdomen: Bowel sounds nontender nondistended soft  Skin: Dressing on the left hip with a scar that is clean.  Musculoskeletal: Intact  Psychiatric: Appropriate  Neurologic: Nonfocal  Labs on Admission:  Basic Metabolic Panel:  Recent Labs Lab 09/05/14 0435  NA 141  K 4.0  CL 109  CO2 23  GLUCOSE 149*  BUN 20  CREATININE 1.46*  CALCIUM 8.2*   Liver Function Tests: No results for input(s): AST, ALT, ALKPHOS, BILITOT, PROT, ALBUMIN in the last 168 hours. No results for input(s): LIPASE, AMYLASE in the last 168 hours. No results for input(s): AMMONIA in the last 168 hours. CBC:  Recent Labs Lab 09/05/14 0435  WBC 22.7*  HGB 9.8*  HCT 29.0*  MCV 85.3  PLT 105*   Cardiac Enzymes:  Recent Labs Lab 09/05/14 0435  TROPONINI 0.12*   BNP: Invalid input(s):  POCBNP CBG: No results for input(s): GLUCAP in the last 168 hours.  Radiological Exams on Admission: Dg C-arm 1-60 Min-no Report  09/04/2014   CLINICAL DATA: surg   C-ARM 1-60 MINUTES  Fluoroscopy was utilized by the requesting physician.  No radiographic  interpretation.    Dg Hip Unilat With Pelvis 1v Left  09/04/2014  CLINICAL DATA:  Left hip replacement  EXAM: DG C-ARM 1-60 MIN - NRPT [facility]; LEFT HIP (WITH PELVIS) 1 VIEW  COMPARISON:  None.  FLUOROSCOPY TIME:  If the device does not provide the exposure index:  Fluoroscopy Time:  1 minutes 3 second  Number of Acquired Images:  2  FINDINGS: Two intraoperative fluoroscopic spot images are provided. Left total hip arthroplasty. No obvious dislocation. Normal alignment.  IMPRESSION: Left hip arthroplasty.   Electronically Signed   By: Kathreen Devoid   On: 09/04/2014 15:17   Dg Hip Port Unilat With Pelvis 1v Left  09/04/2014   CLINICAL DATA:  81 year old male status post left total hip arthroplasty.  EXAM: LEFT HIP (WITH PELVIS) 1 VIEW PORTABLE  COMPARISON:  09/04/2014 at 2:50 p.m.  FINDINGS: Postoperative changes of left total hip arthroplasty are noted. The femoral and acetabular components of the prosthesis appear properly seated without definite periprosthetic fracture or other acute complicating features. The prosthetic femoral head is properly located within the prosthetic acetabular cup. Visualized portions of the bony pelvis and right proximal femur are intact. Moderate degenerative changes of osteoarthritis are noted in the right hip joint.  IMPRESSION: 1. Status post left total hip arthroplasty without complicating features, as above.   Electronically Signed   By: Vinnie Langton M.D.   On: 09/04/2014 16:25    EKG: Independently reviewed. Sinus rhythm left bundle branch block T wave inversions in V1 and V2 unchanged from previous, no other ST segment changes.  Time spent: 85 minutes  Charlynne Cousins [facility] Pager  [phone]  If 7PM-7AM, please contact night-coverage [email or web address] Password [facility] 09/05/2014, 8:23 AM

## 2014-09-05 NOTE — Progress Notes (Signed)
Echocardiogram 2D Echocardiogram has been performed.  [name] 09/05/2014, 9:54 AM

## 2014-09-05 NOTE — Progress Notes (Signed)
CRITICAL VALUE ALERT  Critical value received:  Troponin .64  Date of notification:  09/05/2014  Time of notification:  2000  Critical value read back:Yes.    Nurse who received alert:  L. Vergel de Larkin Ina RN  MD notified (1st page):  Dr. Clayborne Artist (cardiologist)  Time of first page:  2049  MD notified (2nd page): Dr. Clayborne Artist  Time of second page: 2115  Responding MD:  Dr. Clayborne Artist  Time MD responded:  2117

## 2014-09-05 NOTE — Evaluation (Signed)
Physical Therapy Evaluation Patient Details Name: [name] MRN: [id number] DOB: June 03, 1927 Today's Date: 09/05/2014   History of Present Illness  L THR  Clinical Impression  Pt s/p L THR presents with decreased L LE strength/ROM and post op pain limiting functional mobility.  Pt should progress to dc home with family assist and HHPT follow up.    Follow Up Recommendations Home health PT    Equipment Recommendations  None recommended by PT    Recommendations for Other Services OT consult     Precautions / Restrictions Precautions Precautions: Fall Restrictions Weight Bearing Restrictions: No Other Position/Activity Restrictions: WBAT      Mobility  Bed Mobility Overal bed mobility: Needs Assistance Bed Mobility: Supine to Sit     Supine to sit: Min assist     General bed mobility comments: cues for sequence and use of R LE to self assist  Transfers Overall transfer level: Needs assistance Equipment used: Rolling walker (2 wheeled) Transfers: Sit to/from Stand Sit to Stand: Min assist         General transfer comment: cues for LE management and use of UEs to self assist  Ambulation/Gait Ambulation/Gait assistance: Min assist;Min guard Ambulation Distance (Feet): 200 Feet Assistive device: Rolling walker (2 wheeled) Gait Pattern/deviations: Step-to pattern;Step-through pattern;Decreased step length - right;Decreased step length - left;Shuffle;Trunk flexed Gait velocity: mod pace   General Gait Details: cues for posture, position from RW, saftey on turns and initial sequence  Stairs            Wheelchair Mobility    Modified Rankin (Stroke Patients Only)       Balance                                             Pertinent Vitals/Pain Pain Assessment: 0-10 Pain Score: 2  Pain Location: L hip Pain Descriptors / Indicators: Sore Pain Intervention(s): Limited activity within patient's tolerance;Monitored during session     Home Living Family/patient expects to be discharged to:: Private residence Living Arrangements: Spouse/significant other Available Help at Discharge: Family Type of Home: House Home Access: Stairs to enter Entrance Stairs-Rails: Right Entrance Stairs-Number of Steps: 3 Home Layout: Two level Home Equipment: Environmental consultant - 2 wheels;Shower seat Additional Comments: Pt reports level entry into den and 3 steps with R rail up to rest of house.    Prior Function Level of Independence: Independent               Hand Dominance        Extremity/Trunk Assessment   Upper Extremity Assessment: Overall WFL for tasks assessed           Lower Extremity Assessment: LLE deficits/detail   LLE Deficits / Details: 3-/5 hip strength with AAROM at hip to 90 flex and 20 abd  Cervical / Trunk Assessment: Normal  Communication   Communication: No difficulties  Cognition Arousal/Alertness: Awake/alert Behavior During Therapy: WFL for tasks assessed/performed Overall Cognitive Status: Within Functional Limits for tasks assessed                      General Comments      Exercises Total Joint Exercises Ankle Circles/Pumps: AROM;Both;15 reps;Supine Quad Sets: AROM;Both;10 reps;Supine Heel Slides: 20 reps;Left;Supine;AAROM;AROM Hip ABduction/ADduction: AAROM;Left;10 reps;Supine      Assessment/Plan    PT Assessment Patient needs continued PT services  PT Diagnosis  Difficulty walking   PT Problem List Decreased strength;Decreased range of motion;Decreased activity tolerance;Decreased mobility;Decreased knowledge of use of DME;Pain  PT Treatment Interventions DME instruction;Gait training;Stair training;Functional mobility training;Therapeutic activities;Therapeutic exercise;Patient/family education   PT Goals (Current goals can be found in the Care Plan section) Acute Rehab PT Goals Patient Stated Goal: resume previous lifestyle with decreased pain PT Goal Formulation: With  patient Time For Goal Achievement: 09/12/14 Potential to Achieve Goals: Good    Frequency 7X/week   Barriers to discharge        Co-evaluation               End of Session Equipment Utilized During Treatment: Gait belt Activity Tolerance: Patient tolerated treatment well Patient left: in chair;with call bell/phone within reach;with family/visitor present Nurse Communication: Mobility status         Time: 4580-9983 PT Time Calculation (min) (ACUTE ONLY): 40 min   Charges:   PT Evaluation $Initial PT Evaluation Tier I: 1 Procedure PT Treatments $Gait Training: 8-22 mins $Therapeutic Exercise: 8-22 mins   PT G Codes:        Shirline Kendle 2014/09/26, 4:47 PM

## 2014-09-05 NOTE — Progress Notes (Signed)
   Subjective:  Patient reports pain as mild.  Had CP o/n, resolved with nitro. Currently denies N/V/CP/SOB.  Objective:   VITALS:   Filed Vitals:   09/04/14 2134 09/04/14 2205 09/05/14 0100 09/05/14 0506  BP: 131/74 116/76 126/59 137/63  Pulse: 96 99 92 91  Temp: 98.1 F (36.7 C)  98.2 F (36.8 C) 98.2 F (36.8 C)  TempSrc: Oral  Oral Oral  Resp: 16  16 16   Height:      Weight:      SpO2: 97%  95% 96%    Neurologically intact ABD soft Sensation intact distally Intact pulses distally Dorsiflexion/Plantar flexion intact Incision: dressing C/D/I   Lab Results  Component Value Date   WBC 22.7* 09/05/2014   HGB 9.8* 09/05/2014   HCT 29.0* 09/05/2014   MCV 85.3 09/05/2014   PLT 105* 09/05/2014   BMET    Component Value Date/Time   NA 141 09/05/2014 0435   K 4.0 09/05/2014 0435   CL 109 09/05/2014 0435   CO2 23 09/05/2014 0435   GLUCOSE 149* 09/05/2014 0435   BUN 20 09/05/2014 0435   CREATININE 1.46* 09/05/2014 0435   CALCIUM 8.2* 09/05/2014 0435   GFRNONAA 42* 09/05/2014 0435   GFRAA 49* 09/05/2014 0435     Assessment/Plan: 1 Day Post-Op   Principal Problem:   Osteoarthritis of left hip Active Problems:   Primary osteoarthritis of left hip  WBAT with walker IV ancef x23 hrs DVT ppx: ASA 325 mg PO BID, plavix, SCDs, TEDs Chest pain: resolved, will consult hospitalist given elevated troponin Advance diet Up with therapy Dispo: pending PT/OT eval and hospitalist recs   Emmylou Bieker, Horald Pollen 09/05/2014, 7:26 AM   Rod Can, MD Cell [phone]

## 2014-09-05 NOTE — Consult Note (Signed)
Cardiology Consult Note  Admit date: 09/04/2014 Name: [name] 81 y.o.  male DOB:  1927/05/09 MRN:  [id number]  Today's date:  09/05/2014  Referring Physician:    [facility]  Reason for Consultation:  Prolonged anginal pain with abnormal troponins  IMPRESSIONS: 1.  Postoperative anginal pain in a patient with known coronary artery disease and multiple percutaneous interventions.  I suspect that the delay in getting his nitroglycerin lead to prolonged angina and the increased troponin.  It would be helpful if he had nitroglycerin to use at the bedside so that he would not have to wait such a long time to take it. 2.  Coronary artery disease with previous stents and bypass grafting 3.  Dementia 4.  Hyperlipidemia with statin intolerance   RECOMMENDATION: 1.  I would not discharge patient tomorrow but would continue to cycle troponins.  I would be sure that he is stable from a chest pain viewpoint and then his troponins are on a downward trend prior to any discharge 2.  Add nitroglycerin paste while he is in the hospital. 3.  Watch for further anemia and transfuse if he become significantly anemic 4.  Reduce IV fluids to KVO 5.  Consider discontinuation of Foley catheter 6.  If he continues to have frequent episodes of angina would consider institution of intravenous nitroglycerin and systemic heparinization.  HISTORY: This 81 year old male has a history of significant coronary artery disease with previous bypass grafting twice and has had multiple previous percutaneous interventions.  His last catheterization was done in August 2014 when he continued to have angina.  At that time he had a previously patent left main stent, a 70-80% angulated diagonal stenosis with occlusion of the vein graft, collateral filling of the distal right coronary circulation left coronary artery, occluded distal LAD, patent circumflex, patent proximal right coronary artery stent with occlusion of the distal  vessel.  He had a vein graft to the diagonal that was occluded at that time and a patent mammary graft.  Since that time he has continued to have angina oftentimes occurring at rest that he will use nitroglycerin for at home and is on maximum medical therapy.  He normally gets along well but does have angina on a weekly basis.  He has a mild element of dementia.  He had some back surgery that he tolerated in 2015.  He was admitted with severe hip pain and underwent an anterior hip replacement.  He had some angina yesterday he requiring nitroglycerin.  Since he was not allowed to have nitroglycerin the bedside it was about 10 minutes before he could get a pill and by that time he did become fairly severe.  Troponins were cycled and have been rising somewhat but at a low level.  There were no changes on his EKG.  He is currently pain-free and feels okay at the present time.  Past Medical History  Diagnosis Date  . H/O prostate cancer     Prior cryoablation   . Hyperlipidemia 06/26/2012  . S/P CABG (coronary artery bypass graft)   . Villous adenoma of rectum   . Anxiety   . CAD (coronary artery disease) 06/26/2012    PTCA RCA 1995 CABG w. LIMA to LAD, SVG to dx 1, PD and PL March 2000 Dr. Servando Snare for 3VD Cypher stent SVG to RCA  2005 redo CABG 07/2005 with SVG to OM and SVG to PD-PL   DR. Sacha Radloff IS PT'S CARDIOLOGIST  . Memory difficulties   .  Hearing loss     bilateral  . Arthritis       Past Surgical History  Procedure Laterality Date  . Coronary artery bypass graft  2000, 2007    LIMA and SVGs to Diag 1 PDA PLR March 2000,  SVG to OM and SVG to PDA-PL 07/2005  . Partial colectomy  2007  . Prostate cryoablation    . Hernia repair    . Nasal septum surgery    . Lumbar laminectomy/decompression microdiscectomy N/A 02/19/2014    Procedure: MICRO LUMBAR DECOMPRESSION L4-5/L3-4;  Surgeon: Johnn Hai, MD;  Location: [facility];  Service: Orthopedics;  Laterality: N/A;  . Left heart catheterization  with coronary/graft angiogram N/A 06/29/2012    Procedure: LEFT HEART CATHETERIZATION WITH Beatrix Fetters;  Surgeon: Sinclair Grooms, MD;  Location: [facility] CATH LAB;  Service: Cardiovascular;  Laterality: N/A;  . Percutaneous coronary stent intervention (pci-s) N/A 07/03/2012    Procedure: PERCUTANEOUS CORONARY STENT INTERVENTION (PCI-S);  Surgeon: Sinclair Grooms, MD;  Location: [facility] CATH LAB;  Service: Cardiovascular;  Laterality: N/A;  . Left heart catheterization with coronary/graft angiogram N/A 02/28/2013    Procedure: LEFT HEART CATHETERIZATION WITH Beatrix Fetters;  Surgeon: Jacolyn Reedy, MD;  Location: [facility] CATH LAB;  Service: Cardiovascular;  Laterality: N/A;  . Cataract Bilateral     08-13-14  . Total hip arthroplasty Left 09/04/2014    Procedure: LEFT TOTAL HIP ARTHROPLASTY ANTERIOR APPROACH;  Surgeon: Elie Goody, MD;  Location: [facility];  Service: Orthopedics;  Laterality: Left;     Allergies:  is allergic to dilaudid; fenofibrate; gemfibrozil; ranexa; red yeast rice; statins; and zetia.   Medications: Prior to Admission medications   Medication Sig Start Date End Date Taking? Authorizing Provider  amLODipine (NORVASC) 5 MG tablet Take 5 mg by mouth every morning.    Yes Historical Provider, MD  bimatoprost (LUMIGAN) 0.03 % ophthalmic solution Place 1 drop into both eyes at bedtime.    Yes Historical Provider, MD  cholecalciferol (VITAMIN D) 1000 UNITS tablet Take 1,000 Units by mouth every morning.    Yes Historical Provider, MD  clopidogrel (PLAVIX) 75 MG tablet Take 75 mg by mouth every morning.    Yes Historical Provider, MD  finasteride (PROSCAR) 5 MG tablet Take 5 mg by mouth every morning.    Yes Historical Provider, MD  GARLIC PO Take 1 tablet by mouth every morning.    Yes Historical Provider, MD  Ibuprofen-Diphenhydramine Cit 200-38 MG TABS Take 1 tablet by mouth at bedtime as needed.   Yes Historical Provider, MD  isosorbide mononitrate (IMDUR)  120 MG 24 hr tablet Take 120 mg by mouth daily with lunch.    Yes Historical Provider, MD  Memantine HCl ER 21 MG CP24 Take 1 capsule by mouth daily.   Yes Historical Provider, MD  metoprolol (LOPRESSOR) 50 MG tablet Take 75 mg by mouth 2 (two) times daily.   Yes Historical Provider, MD  OVER THE COUNTER MEDICATION Take 1 capsule by mouth every morning. Mega Red Omega-3 Krill Oil   Yes Historical Provider, MD  Timolol Maleate (ISTALOL) 0.5 % (DAILY) SOLN Place 1 drop into both eyes every morning.    Yes Historical Provider, MD  docusate sodium (COLACE) 100 MG capsule Take 1 capsule (100 mg total) by mouth 2 (two) times daily as needed for mild constipation. Patient not taking: Reported on 08/27/2014 02/19/14   Johnn Hai, MD  HYDROcodone-acetaminophen (NORCO/VICODIN) 5-325 MG per tablet Take 1 tablet  by mouth every 4 (four) hours as needed. Patient not taking: Reported on 08/27/2014 02/19/14   Johnn Hai, MD  ibuprofen (ADVIL,MOTRIN) 200 MG tablet Take 400-600 mg by mouth every 6 (six) hours as needed for mild pain or moderate pain.    Historical Provider, MD  nitroGLYCERIN (NITROSTAT) 0.4 MG SL tablet Place 0.4 mg under the tongue every 5 (five) minutes as needed for chest pain.     Historical Provider, MD    Family History: Family Status  Relation Status Death Age  . Father Deceased 19  . Mother Deceased 72  . Brother Deceased 28  . Sister Alive   . Daughter      history of brain cancer    Social History:   reports that he has quit smoking. He has never used smokeless tobacco. He reports that he drinks about 0.6 oz of alcohol per week. He reports that he does not use illicit drugs.   History   Social History Narrative   Retired, worked in Engineer, water     Review of Systems: Other than as noted above the remainder of the review of systems is unremarkable.  Physical Exam: BP 132/57 mmHg  Pulse 74  Temp(Src) 98.4 F (36.9 C) (Oral)   Resp 18  Ht 5\' 7"  (1.702 m)  Wt 78.472 kg (173 lb)  BMI 27.09 kg/m2  SpO2 94%  General appearance: Elderly pleasant male mildly confused at times Neck: no adenopathy, no carotid bruit, no JVD and supple, symmetrical, trachea midline Lungs: clear to auscultation bilaterally Heart: regular rate and rhythm, S1, S2 normal, no murmur, click, rub or gallop Abdomen: soft, non-tender; bowel sounds normal; no masses,  no organomegaly Rectal: deferred Extremities: extremities normal, atraumatic, no cyanosis or edema Pulses: 2+ and symmetric Neurologic: Grossly normal  Labs: CBC  Recent Labs  09/05/14 0435  WBC 22.7*  RBC 3.40*  HGB 9.8*  HCT 29.0*  PLT 105*  MCV 85.3  MCH 28.8  MCHC 33.8  RDW 15.5   CMP   Recent Labs  09/05/14 0435  NA 141  K 4.0  CL 109  CO2 23  GLUCOSE 149*  BUN 20  CREATININE 1.46*  CALCIUM 8.2*  GFRNONAA 42*  GFRAA 49*   Cardiac Panel (last 3 results)  Recent Labs  09/05/14 0435 09/05/14 0805 09/05/14 1355  TROPONINI 0.12* 0.26* 0.49*     EKG: RBBB, sinus rhythm, no ischemic ST changes  Signed:  W. Doristine Church MD [facility]   Cardiology Consultant  09/05/2014, 5:39 PM

## 2014-09-05 NOTE — Progress Notes (Signed)
PT Cancellation Note  Patient Details Name: [name] MRN: [id number] DOB: [birth year]   Cancelled Treatment:     PT deferred this am - pt with cardiac symptoms and transferring to telemetry.   Will follow.   Kaien Pezzullo 09/05/2014, 12:14 PM

## 2014-09-06 DIAGNOSIS — N179 Acute kidney failure, unspecified: Secondary | ICD-10-CM

## 2014-09-06 DIAGNOSIS — M1612 Unilateral primary osteoarthritis, left hip: Principal | ICD-10-CM

## 2014-09-06 DIAGNOSIS — I214 Non-ST elevation (NSTEMI) myocardial infarction: Secondary | ICD-10-CM | POA: Insufficient documentation

## 2014-09-06 LAB — BASIC METABOLIC PANEL
Anion gap: 4 — ABNORMAL LOW (ref 5–15)
BUN: 13 mg/dL (ref 6–23)
CO2: 24 mmol/L (ref 19–32)
Calcium: 7.6 mg/dL — ABNORMAL LOW (ref 8.4–10.5)
Chloride: 110 mmol/L (ref 96–112)
Creatinine, Ser: 1.16 mg/dL (ref 0.50–1.35)
GFR calc Af Amer: 64 mL/min — ABNORMAL LOW (ref >90)
GFR calc non Af Amer: 56 mL/min — ABNORMAL LOW (ref >90)
Glucose, Bld: 106 mg/dL — ABNORMAL HIGH (ref 70–99)
POTASSIUM: 3.5 mmol/L (ref 3.5–5.1)
Sodium: 138 mmol/L (ref 135–145)

## 2014-09-06 LAB — PREPARE RBC (CROSSMATCH)

## 2014-09-06 LAB — HEPARIN LEVEL (UNFRACTIONATED): Heparin Unfractionated: <0.1 IU/mL — ABNORMAL LOW (ref 0.30–0.70)

## 2014-09-06 LAB — CBC
HEMATOCRIT: 25.9 % — AB (ref 39.0–52.0)
Hemoglobin: 8.5 g/dL — ABNORMAL LOW (ref 13.0–17.0)
MCH: 27.9 pg (ref 26.0–34.0)
MCHC: 32.8 g/dL (ref 30.0–36.0)
MCV: 84.9 fL (ref 78.0–100.0)
Platelets: 65 10*3/uL — ABNORMAL LOW (ref 150–400)
RBC: 3.05 MIL/uL — ABNORMAL LOW (ref 4.22–5.81)
RDW: 15.6 % — AB (ref 11.5–15.5)
WBC: 10.3 10*3/uL (ref 4.0–10.5)

## 2014-09-06 LAB — TROPONIN I
TROPONIN I: 0.95 ng/mL — AB (ref <0.031)
Troponin I: 1.86 ng/mL (ref <0.031)

## 2014-09-06 MED ORDER — HEPARIN (PORCINE) IN NACL 100-0.45 UNIT/ML-% IJ SOLN
1050.0000 [IU]/h | INTRAMUSCULAR | Status: DC
  Administered 2014-09-07: 1050 [IU]/h via INTRAVENOUS
  Filled 2014-09-06 (×2): qty 250

## 2014-09-06 MED ORDER — HEPARIN (PORCINE) IN NACL 100-0.45 UNIT/ML-% IJ SOLN
950.0000 [IU]/h | INTRAMUSCULAR | Status: DC
Start: 2014-09-06 — End: 2014-09-06
  Administered 2014-09-06: 950 [IU]/h via INTRAVENOUS
  Filled 2014-09-06 (×2): qty 250

## 2014-09-06 MED ORDER — ASPIRIN EC 81 MG PO TBEC
81.0000 mg | DELAYED_RELEASE_TABLET | Freq: Every day | ORAL | Status: DC
  Administered 2014-09-07 – 2014-09-11 (×5): 81 mg via ORAL
  Filled 2014-09-06 (×5): qty 1

## 2014-09-06 MED ORDER — MEMANTINE HCL ER 7 MG PO CP24
21.0000 mg | ORAL_CAPSULE | Freq: Every day | ORAL | Status: DC
  Administered 2014-09-06 – 2014-09-11 (×6): 21 mg via ORAL
  Filled 2014-09-06 (×7): qty 1

## 2014-09-06 MED ORDER — SODIUM CHLORIDE 0.9 % IV SOLN
Freq: Once | INTRAVENOUS | Status: AC
Start: 2014-09-06 — End: 2014-09-06
  Administered 2014-09-06: 13:00:00 via INTRAVENOUS

## 2014-09-06 MED ORDER — HEPARIN BOLUS VIA INFUSION
4000.0000 [IU] | Freq: Once | INTRAVENOUS | Status: AC
  Administered 2014-09-06: 4000 [IU] via INTRAVENOUS
  Filled 2014-09-06: qty 4000

## 2014-09-06 NOTE — Progress Notes (Signed)
CRITICAL VALUE ALERT  Critical value received:  Troponin 1.86  Date of notification: 09/06/2014  Time of notification:  0730  Critical value read back:Yes.    Nurse who received alert:  Hale Bogus.  MD notified (1st page): Dr. Wynonia Lawman  Time of first page:  [phone]  MD notified (2nd page): Dr. Thurman Coyer on-call MD  Time of second page: [phone]  Responding MD:  Arlee Muslim  Time MD responded:  [phone].  Call advised that Dr. Caryl Comes would round on patient.

## 2014-09-06 NOTE — Progress Notes (Signed)
[facility] CONSULTPROGRESS NOTE  Assessment/Plan: Unstable angina pectoris/  S/P CABG (coronary artery bypass graft): - I agree with cardiology, now on nitro and heparin. Cont plavix and beta blockers. - Echo showed no wall motion. - cardiac enzymes cont to rise, monitor Hbg keep > 9.0. - I will sign off.  AKI: - resolved with hydration likely due to hypovolemia.  Leukocytosis: - improving, afebrile. Likely reactive. - cont to hold antibiotics.  Osteoarthritis of left hip - Management per orthopedic status post a hip replacement on 09/04/2014. - Pain is control having regular bowel movements.   HPI/Subjective: No further CP  Objective: Filed Vitals:   09/06/14 0200 09/06/14 0300 09/06/14 0400 09/06/14 0600  BP: 127/50 137/53 129/66 133/75  Pulse: 72 87 88 90  Temp:   98.8 F (37.1 C)   TempSrc:   Oral   Resp: 18 14 14 28   Height:      Weight:      SpO2: 99% 93% 95% 98%    Intake/Output Summary (Last 24 hours) at 09/06/14 0734 Last data filed at 09/06/14 0700  Gross per 24 hour  Intake 1418.33 ml  Output   1550 ml  Net -131.67 ml   Filed Weights   09/04/14 1715  Weight: 78.472 kg (173 lb)    Exam:  General: Alert, awake, oriented x3, in no acute distress.  HEENT: No bruits, no goiter.  Heart: Regular rate and rhythm. Lungs: Good air movement, clear Abdomen: Soft, nontender, nondistended, positive bowel sounds.  Neuro: Grossly intact, nonfocal.   Data Reviewed: Basic Metabolic Panel:  Recent Labs Lab 09/05/14 0435 09/06/14 0640  NA 141 138  K 4.0 3.5  CL 109 110  CO2 23 24  GLUCOSE 149* 106*  BUN 20 13  CREATININE 1.46* 1.16  CALCIUM 8.2* 7.6*   Liver Function Tests: No results for input(s): AST, ALT, ALKPHOS, BILITOT, PROT, ALBUMIN in the last 168 hours. No results for input(s): LIPASE, AMYLASE in the last 168 hours. No results for input(s): AMMONIA in the last 168 hours. CBC:  Recent Labs Lab 09/05/14 0435 09/05/14 1850  09/06/14 0640  WBC 22.7* 16.3* 10.3  NEUTROABS  --  12.6*  --   HGB 9.8* 9.1* 8.5*  HCT 29.0* 28.0* 25.9*  MCV 85.3 87.5 84.9  PLT 105* 112* 65*   Cardiac Enzymes:  Recent Labs Lab 09/05/14 0805 09/05/14 1355 09/05/14 1850 09/06/14 0050 09/06/14 0640  TROPONINI 0.26* 0.49* 0.76* 0.95* 1.86*   BNP (last 3 results) No results for input(s): BNP in the last 8760 hours.  ProBNP (last 3 results) No results for input(s): PROBNP in the last 8760 hours.  CBG: No results for input(s): GLUCAP in the last 168 hours.  Recent Results (from the past 240 hour(s))  Surgical pcr screen     Status: None   Collection Time: 08/28/14 12:00 PM  Result Value Ref Range Status   MRSA, PCR NEGATIVE NEGATIVE Final   Staphylococcus aureus NEGATIVE NEGATIVE Final    Comment:        The Xpert SA Assay (FDA approved for NASAL specimens in patients over 60 years of age), is one component of a comprehensive surveillance program.  Test performance has been validated by [facility] for patients greater than or equal to 68 year old. It is not intended to diagnose infection nor to guide or monitor treatment.      Studies: Dg C-arm 1-60 Min-no Report  09/04/2014   CLINICAL DATA: surg   C-ARM 1-60  MINUTES  Fluoroscopy was utilized by the requesting physician.  No radiographic  interpretation.    Dg Hip Unilat With Pelvis 1v Left  09/04/2014   CLINICAL DATA:  Left hip replacement  EXAM: DG C-ARM 1-60 MIN - NRPT [facility]; LEFT HIP (WITH PELVIS) 1 VIEW  COMPARISON:  None.  FLUOROSCOPY TIME:  If the device does not provide the exposure index:  Fluoroscopy Time:  1 minutes 3 second  Number of Acquired Images:  2  FINDINGS: Two intraoperative fluoroscopic spot images are provided. Left total hip arthroplasty. No obvious dislocation. Normal alignment.  IMPRESSION: Left hip arthroplasty.   Electronically Signed   By: Kathreen Devoid   On: 09/04/2014 15:17   Dg Hip Port Unilat With Pelvis 1v Left  09/04/2014    CLINICAL DATA:  81 year old male status post left total hip arthroplasty.  EXAM: LEFT HIP (WITH PELVIS) 1 VIEW PORTABLE  COMPARISON:  09/04/2014 at 2:50 p.m.  FINDINGS: Postoperative changes of left total hip arthroplasty are noted. The femoral and acetabular components of the prosthesis appear properly seated without definite periprosthetic fracture or other acute complicating features. The prosthetic femoral head is properly located within the prosthetic acetabular cup. Visualized portions of the bony pelvis and right proximal femur are intact. Moderate degenerative changes of osteoarthritis are noted in the right hip joint.  IMPRESSION: 1. Status post left total hip arthroplasty without complicating features, as above.   Electronically Signed   By: Vinnie Langton M.D.   On: 09/04/2014 16:25    Scheduled Meds: . amLODipine  5 mg Oral q morning - 10a  . aspirin EC  325 mg Oral BID PC  . cholecalciferol  1,000 Units Oral q morning - 10a  . clopidogrel  75 mg Oral q morning - 10a  . docusate sodium  100 mg Oral BID  . finasteride  5 mg Oral q morning - 10a  . latanoprost  1 drop Both Eyes QHS  . Memantine HCl ER  1 capsule Oral Daily  . metoprolol  75 mg Oral BID  . senna  1 tablet Oral BID  . timolol  1 drop Both Eyes Daily   Continuous Infusions: . nitroGLYCERIN 35 mcg/min (09/05/14 2309)     Charlynne Cousins  [facility] Pager [phone]. If 7PM-7AM, please contact night-coverage at [email or web address], password [facility] 09/06/2014, 7:34 AM  LOS: 2 days

## 2014-09-06 NOTE — Progress Notes (Signed)
PT Cancellation Note  Patient Details Name: [name] MRN: [id number] DOB: 05-Feb-1927   Cancelled Treatment:     RN reported to PT Tech, PT cancelled due to medical issue elevated Troponin and transferred to ICU from [place].     Nathanial Rancher 09/06/2014, 9:09 AM

## 2014-09-06 NOTE — Progress Notes (Signed)
Patient Name: [name]      SUBJECTIVE:81 yo admitted with hip apin and underwent anterior hip replacement.  Postoperative he had anginal pain with + Tn;  Seen yday by Dr Wynonia Lawman who knows him well Tn unfortunately have continued to rise. His thoughts were, if this turned out to be the case, to treat with Heparin, presuming ok from surgery, and IV NTG;  He is already on clopidigrel      He has a history of significant coronary artery disease with previous bypass grafting twice and has had multiple previous percutaneous interventions. His last catheterization was done in August 2014 when he continued to have angina. At that time he had a previously patent left main stent, a 70-80% angulated diagonal stenosis with occlusion of the vein graft, collateral filling of the distal right coronary circulation left coronary artery, occluded distal LAD, patent circumflex, patent proximal right coronary artery stent with occlusion of the distal vessel. He had a vein graft to the diagonal that was occluded at that time and a patent mammary graft. Since that time he has continued to have angina oftentimes occurring at rest that he will use nitroglycerin for at home and is on maximum medical therapy. He normally gets along well but does have angina on a weekly basis.    Past Medical History  Diagnosis Date  . H/O prostate cancer     Prior cryoablation   . Hyperlipidemia 06/26/2012  . S/P CABG (coronary artery bypass graft)   . Villous adenoma of rectum   . Anxiety   . CAD (coronary artery disease) 06/26/2012    PTCA RCA 1995 CABG w. LIMA to LAD, SVG to dx 1, PD and PL March 2000 Dr. Servando Snare for 3VD Cypher stent SVG to RCA  2005 redo CABG 07/2005 with SVG to OM and SVG to PD-PL   DR. TILLEY IS PT'S CARDIOLOGIST  . Memory difficulties   . Hearing loss     bilateral  . Arthritis     Scheduled Meds:  Scheduled Meds: . amLODipine  5 mg Oral q morning - 10a  . aspirin EC  325 mg  Oral BID PC  . cholecalciferol  1,000 Units Oral q morning - 10a  . clopidogrel  75 mg Oral q morning - 10a  . docusate sodium  100 mg Oral BID  . finasteride  5 mg Oral q morning - 10a  . latanoprost  1 drop Both Eyes QHS  . Memantine HCl ER  1 capsule Oral Daily  . metoprolol  75 mg Oral BID  . senna  1 tablet Oral BID  . timolol  1 drop Both Eyes Daily   Continuous Infusions: . nitroGLYCERIN 35 mcg/min (09/05/14 2309)   acetaminophen **OR** acetaminophen, diphenhydrAMINE, HYDROcodone-acetaminophen, menthol-cetylpyridinium **OR** phenol, methocarbamol **OR** methocarbamol (ROBAXIN)  IV, metoCLOPramide **OR** metoCLOPramide (REGLAN) injection, morphine injection, nitroGLYCERIN, ondansetron **OR** ondansetron (ZOFRAN) IV    PHYSICAL EXAM Filed Vitals:   09/06/14 0300 09/06/14 0400 09/06/14 0600 09/06/14 0800  BP: 137/53 129/66 133/75   Pulse: 87 88 90   Temp:  98.8 F (37.1 C)    TempSrc:  Oral    Resp: 14 14 28 16   Height:      Weight:      SpO2: 93% 95% 98% 95%  Well developed and nourished in no acute distress HENT normal Neck supple with JVP-flat Clear Regular rate and rhythm, no murmurs or gallops Abd-soft with active BS No Clubbing cyanosis  edema Skin-warm and dry A & Oriented  Grossly normal sensory and motor function  TELEMETRY: Reviewed telemetry pt in *sinus tach   Intake/Output Summary (Last 24 hours) at 09/06/14 0853 Last data filed at 09/06/14 0700  Gross per 24 hour  Intake 1058.33 ml  Output   1550 ml  Net -491.67 ml    LABS: Basic Metabolic Panel:  Recent Labs Lab 09/05/14 0435 09/06/14 0640  NA 141 138  K 4.0 3.5  CL 109 110  CO2 23 24  GLUCOSE 149* 106*  BUN 20 13  CREATININE 1.46* 1.16  CALCIUM 8.2* 7.6*   Cardiac Enzymes:  Recent Labs  09/05/14 1850 09/06/14 0050 09/06/14 0640  TROPONINI 0.76* 0.95* 1.86*   CBC:  Recent Labs Lab 09/05/14 0435 09/05/14 1850 09/06/14 0640  WBC 22.7* 16.3* 10.3  NEUTROABS  --  12.6*   --   HGB 9.8* 9.1* 8.5*  HCT 29.0* 28.0* 25.9*  MCV 85.3 87.5 84.9  PLT 105* 112* 65*   PROTIME:  Recent Labs  09/04/14 1130  LABPROT 17.2*  INR 1.39     ASSESSMENT AND PLAN:  Principal Problem:   Osteoarthritis of left hip Active Problems:   CAD (coronary artery disease)   S/P CABG (coronary artery bypass graft)   Primary osteoarthritis of left hip   Angina pectoris   Elevated troponin  He should be transfused with evidence of ongoing ischemia Will begin heparin and continue IV NTG Discussed with  Ortho ok for heparin Decrease ASA  tachycardia likely related to anemia, if still up tomorrow will increase bb  Signed, Virl Axe MD  09/06/2014

## 2014-09-06 NOTE — Progress Notes (Signed)
Subjective: 2 Days Post-Op Procedure(s) (LRB): LEFT TOTAL HIP ARTHROPLASTY ANTERIOR APPROACH (Left) Patient reports pain as mild.  No c/o CP, SOB.  No BM yet.  Tolerating regular diet.  Cards recs reviewed.  Objective: Vital signs in last 24 hours: Temp:  [97.9 F (36.6 C)-99.3 F (37.4 C)] 98.8 F (37.1 C) (03/05 0400) Pulse Rate:  [70-101] 90 (03/05 0600) Resp:  [14-28] 16 (03/05 0800) BP: (106-168)/(39-84) 133/75 mmHg (03/05 0600) SpO2:  [92 %-99 %] 95 % (03/05 0800)  Intake/Output from previous day: 03/04 0701 - 03/05 0700 In: 1418.3 [P.O.:840; I.V.:578.3] Out: 1550 [Urine:1550] Intake/Output this shift:     Recent Labs  09/05/14 0435 09/05/14 1850 09/06/14 0640  HGB 9.8* 9.1* 8.5*    Recent Labs  09/05/14 1850 09/06/14 0640  WBC 16.3* 10.3  RBC 3.20* 3.05*  HCT 28.0* 25.9*  PLT 112* 65*    Recent Labs  09/05/14 0435 09/06/14 0640  NA 141 138  K 4.0 3.5  CL 109 110  CO2 23 24  BUN 20 13  CREATININE 1.46* 1.16  GLUCOSE 149* 106*  CALCIUM 8.2* 7.6*    Recent Labs  09/04/14 1130  INR 1.39    PE:  wn wd male in nad.  L hip wound dressed and dry.  NVI at L LE.  Assessment/Plan: 2 Days Post-Op Procedure(s) (LRB): LEFT TOTAL HIP ARTHROPLASTY ANTERIOR APPROACH (Left) Up with therapy  Transfuse 2 u PRBCs per cards recs.  Appreciate Dr. Olin Pia assistance.  Wylene Simmer 09/06/2014, 9:16 AM

## 2014-09-06 NOTE — Progress Notes (Signed)
ANTICOAGULATION CONSULT NOTE - Initial Consult  Pharmacy Consult for Heparin Indication: ACS  Allergies  Allergen Reactions  . Dilaudid [Hydromorphone Hcl]     Prefers not to have"causes paranoia"  . Fenofibrate Other (See Comments)    Reaction unknown  . Gemfibrozil Other (See Comments)    Reaction unknown  . Ranexa [Ranolazine] Other (See Comments)    Reaction unknown   . Red Yeast Rice [Cholestin] Other (See Comments)    Reaction unknown   . Statins Other (See Comments)    Muscle pain  . Zetia [Ezetimibe] Other (See Comments)    Reaction unknown     Patient Measurements: Height: 5\' 7"  (170.2 cm) Weight: 173 lb (78.472 kg) IBW/kg (Calculated) : 66.1 Heparin Dosing Weight: actual weight  Vital Signs: Temp: 98.8 F (37.1 C) (03/05 0400) Temp Source: Oral (03/05 0400) BP: 133/75 mmHg (03/05 0600) Pulse Rate: 90 (03/05 0600)  Labs:  Recent Labs  09/04/14 1130  09/05/14 0435  09/05/14 1850 09/06/14 0050 09/06/14 0640  HGB  --   < > 9.8*  --  9.1*  --  8.5*  HCT  --   --  29.0*  --  28.0*  --  25.9*  PLT  --   --  105*  --  112*  --  65*  LABPROT 17.2*  --   --   --   --   --   --   INR 1.39  --   --   --   --   --   --   CREATININE  --   --  1.46*  --   --   --  1.16  TROPONINI  --   --  0.12*  < > 0.76* 0.95* 1.86*  < > = values in this interval not displayed.  Estimated Creatinine Clearance: 43.5 mL/min (by C-G formula based on Cr of 1.16).   Medical History: Past Medical History  Diagnosis Date  . H/O prostate cancer     Prior cryoablation   . Hyperlipidemia 06/26/2012  . S/P CABG (coronary artery bypass graft)   . Villous adenoma of rectum   . Anxiety   . CAD (coronary artery disease) 06/26/2012    PTCA RCA 1995 CABG w. LIMA to LAD, SVG to dx 1, PD and PL March 2000 Dr. Servando Snare for 3VD Cypher stent SVG to RCA  2005 redo CABG 07/2005 with SVG to OM and SVG to PD-PL   DR. TILLEY IS PT'S CARDIOLOGIST  . Memory difficulties   . Hearing loss    bilateral  . Arthritis     Medications:  Scheduled:  . sodium chloride   Intravenous Once  . amLODipine  5 mg Oral q morning - 10a  . aspirin EC  325 mg Oral BID PC  . cholecalciferol  1,000 Units Oral q morning - 10a  . clopidogrel  75 mg Oral q morning - 10a  . docusate sodium  100 mg Oral BID  . finasteride  5 mg Oral q morning - 10a  . latanoprost  1 drop Both Eyes QHS  . Memantine HCl ER  1 capsule Oral Daily  . metoprolol  75 mg Oral BID  . senna  1 tablet Oral BID  . timolol  1 drop Both Eyes Daily   Infusions:  . nitroGLYCERIN 35 mcg/min (09/05/14 2309)    Assessment: 51 yoM admitted on 3/3 for Left total hip arthroplasty, anterior approach.  PMH includes CAD s/p CABG/PCI, stenting, and chronic stable  angina.  Post-op he developed angina and has elevated troponins.  Cardiology has consulted pharmacy to dose heparin drip.  Per notes, ortho agrees with IV heparin.   CBC: Hgb 8.5 and Plt 65 are low and decreased post-op.  Rn confirms no active bleeding noted.  Planning transfusion of 2 units PRBC today.  Troponin: 0.26, 0.49, 0.76, 0.95, 1.86  SCr 1.16, CrCl ~ 43 ml/min  Baseline INR 1.39 (3/3) and aPTT 30 (2/25)  Goal of Therapy:  Heparin level 0.3-0.7 units/ml Monitor platelets by anticoagulation protocol: Yes   Plan:   Give heparin 4000 units bolus IV x 1  Start heparin IV infusion at 950 units/hr  Heparin level 8 hours after starting  Daily heparin level and CBC  Continue to monitor H&H and platelets  Gretta Arab PharmD, BCPS Pager [phone] 09/06/2014 9:49 AM

## 2014-09-06 NOTE — Progress Notes (Signed)
OT Cancellation Note  Patient Details Name: [name] MRN: [id number] DOB: 10-Sep-1926   Cancelled Treatment:    Reason Eval/Treat Not Completed: Medical issues which prohibited therapy (elevated troponin, transferred to ICU)  [name] A 09/06/2014, 9:14 AM

## 2014-09-07 DIAGNOSIS — D696 Thrombocytopenia, unspecified: Secondary | ICD-10-CM

## 2014-09-07 LAB — HEMOGLOBIN AND HEMATOCRIT, BLOOD
HCT: 32 % — ABNORMAL LOW (ref 39.0–52.0)
Hemoglobin: 10.8 g/dL — ABNORMAL LOW (ref 13.0–17.0)

## 2014-09-07 LAB — CBC
HCT: 30.9 % — ABNORMAL LOW (ref 39.0–52.0)
Hemoglobin: 10.1 g/dL — ABNORMAL LOW (ref 13.0–17.0)
MCH: 27.9 pg (ref 26.0–34.0)
MCHC: 32.7 g/dL (ref 30.0–36.0)
MCV: 85.4 fL (ref 78.0–100.0)
Platelets: 68 10*3/uL — ABNORMAL LOW (ref 150–400)
RBC: 3.62 MIL/uL — AB (ref 4.22–5.81)
RDW: 15.2 % (ref 11.5–15.5)
WBC: 9.9 10*3/uL (ref 4.0–10.5)

## 2014-09-07 LAB — HEPARIN LEVEL (UNFRACTIONATED): Heparin Unfractionated: <0.1 IU/mL — ABNORMAL LOW (ref 0.30–0.70)

## 2014-09-07 LAB — TROPONIN I: Troponin I: 1.53 ng/mL (ref <0.031)

## 2014-09-07 MED ORDER — SODIUM CHLORIDE 0.9 % IJ SOLN
3.0000 mL | Freq: Two times a day (BID) | INTRAMUSCULAR | Status: DC
  Administered 2014-09-07 – 2014-09-10 (×6): 3 mL via INTRAVENOUS

## 2014-09-07 MED ORDER — HEPARIN (PORCINE) IN NACL 100-0.45 UNIT/ML-% IJ SOLN
1550.0000 [IU]/h | INTRAMUSCULAR | Status: DC
  Administered 2014-09-07 (×2): 1550 [IU]/h via INTRAVENOUS
  Filled 2014-09-07 (×3): qty 250

## 2014-09-07 MED ORDER — HEPARIN (PORCINE) IN NACL 100-0.45 UNIT/ML-% IJ SOLN
1300.0000 [IU]/h | INTRAMUSCULAR | Status: DC
  Administered 2014-09-07: 1300 [IU]/h via INTRAVENOUS
  Filled 2014-09-07 (×2): qty 250

## 2014-09-07 MED ORDER — HEPARIN BOLUS VIA INFUSION
2000.0000 [IU] | Freq: Once | INTRAVENOUS | Status: AC
  Administered 2014-09-07: 2000 [IU] via INTRAVENOUS
  Filled 2014-09-07: qty 2000

## 2014-09-07 MED ORDER — MAGNESIUM CITRATE PO SOLN
1.0000 | Freq: Once | ORAL | Status: AC
  Administered 2014-09-07: 1 via ORAL
  Filled 2014-09-07: qty 296

## 2014-09-07 NOTE — Progress Notes (Signed)
ANTICOAGULATION CONSULT NOTE - Follow Up Consult  Pharmacy Consult for Heparin Indication: chest pain/ACS  Allergies  Allergen Reactions  . Dilaudid [Hydromorphone Hcl]     Prefers not to have"causes paranoia"  . Fenofibrate Other (See Comments)    Reaction unknown  . Gemfibrozil Other (See Comments)    Reaction unknown  . Ranexa [Ranolazine] Other (See Comments)    Reaction unknown   . Red Yeast Rice [Cholestin] Other (See Comments)    Reaction unknown   . Statins Other (See Comments)    Muscle pain  . Zetia [Ezetimibe] Other (See Comments)    Reaction unknown     Patient Measurements: Height: 5\' 7"  (170.2 cm) Weight: 173 lb (78.472 kg) IBW/kg (Calculated) : 66.1 Heparin Dosing Weight:   Vital Signs: Temp: 98.3 F (36.8 C) (03/06 0000) Temp Source: Oral (03/06 0000) BP: 119/71 mmHg (03/06 0300) Pulse Rate: 67 (03/06 0300)  Labs:  Recent Labs  09/04/14 1130  09/05/14 0435  09/05/14 1850 09/06/14 0050 09/06/14 0640 09/06/14 2010 09/07/14 0400  HGB  --   < > 9.8*  --  9.1*  --  8.5*  --  10.1*  HCT  --   < > 29.0*  --  28.0*  --  25.9*  --  30.9*  PLT  --   < > 105*  --  112*  --  65*  --  68*  LABPROT 17.2*  --   --   --   --   --   --   --   --   INR 1.39  --   --   --   --   --   --   --   --   HEPARINUNFRC  --   --   --   --   --   --   --  <0.10*  --   CREATININE  --   --  1.46*  --   --   --  1.16  --   --   TROPONINI  --   --  0.12*  < > 0.76* 0.95* 1.86*  --   --   < > = values in this interval not displayed.  Estimated Creatinine Clearance: 43.5 mL/min (by C-G formula based on Cr of 1.16).   Medications:  Infusions:  . heparin 1,050 Units/hr (09/07/14 0300)  . nitroGLYCERIN 35 mcg/min (09/07/14 0300)    Assessment: Patient with low heparin level.  Patient did recently finish blood transfusion ~2hr prior to level.  This might have reduced level.  Goal of Therapy:  Heparin level 0.3-0.7 units/ml Monitor platelets by anticoagulation  protocol: Yes   Plan:  Increase heparin to 1050 units/hr Recheck level at [place] 09/07/2014,4:16 AM

## 2014-09-07 NOTE — Progress Notes (Signed)
09/07/2014 1800 HH arranged with [facility]. Waiting final recommendations for home. Jonnie Finner RN CCM Case Mgmt phone [phone]

## 2014-09-07 NOTE — Progress Notes (Addendum)
Shift Event: RN notified that pt with 2 episodes of chest pain, relieved by SL NTG. Record reviewed, pt with unstable angina perctoris placed on Heparin gtt and IV NTG, which was then discontinued due to hypotension.  VSS. At bedside, pt is NAD, denies any cp, sob, n/v/ or diaphoresis. Heart-RRR. Lungs-CTAB. ABD- SNTND, +BS. EKG- no acute ST/T wave abnormalities, unhanged from previous. Trop x1 trending down to 0.69, will continue to cycle.   Sebree [facility] [phone]

## 2014-09-07 NOTE — Progress Notes (Signed)
ANTICOAGULATION CONSULT NOTE - Follow Up  Pharmacy Consult for Heparin Indication: ACS  Allergies  Allergen Reactions  . Dilaudid [Hydromorphone Hcl]     Prefers not to have"causes paranoia"  . Fenofibrate Other (See Comments)    Reaction unknown  . Gemfibrozil Other (See Comments)    Reaction unknown  . Ranexa [Ranolazine] Other (See Comments)    Reaction unknown   . Red Yeast Rice [Cholestin] Other (See Comments)    Reaction unknown   . Statins Other (See Comments)    Muscle pain  . Zetia [Ezetimibe] Other (See Comments)    Reaction unknown     Patient Measurements: Height: 5\' 7"  (170.2 cm) Weight: 173 lb (78.472 kg) IBW/kg (Calculated) : 66.1 Heparin Dosing Weight: actual weight  Vital Signs: Temp: 97.3 F (36.3 C) (03/06 0800) Temp Source: Oral (03/06 0800) BP: 146/55 mmHg (03/06 0700) Pulse Rate: 71 (03/06 0700)  Labs:  Recent Labs  09/04/14 1130  09/05/14 0435  09/05/14 1850 09/06/14 0050 09/06/14 0640 09/06/14 2010 09/07/14 0400 09/07/14 0818  HGB  --   < > 9.8*  --  9.1*  --  8.5*  --  10.1*  --   HCT  --   < > 29.0*  --  28.0*  --  25.9*  --  30.9*  --   PLT  --   < > 105*  --  112*  --  65*  --  68*  --   LABPROT 17.2*  --   --   --   --   --   --   --   --   --   INR 1.39  --   --   --   --   --   --   --   --   --   HEPARINUNFRC  --   --   --   --   --   --   --  <0.10*  --  <0.10*  CREATININE  --   --  1.46*  --   --   --  1.16  --   --   --   TROPONINI  --   --  0.12*  < > 0.76* 0.95* 1.86*  --   --  1.53*  < > = values in this interval not displayed.  Estimated Creatinine Clearance: 43.5 mL/min (by C-G formula based on Cr of 1.16).   Medical History: Past Medical History  Diagnosis Date  . H/O prostate cancer     Prior cryoablation   . Hyperlipidemia 06/26/2012  . S/P CABG (coronary artery bypass graft)   . Villous adenoma of rectum   . Anxiety   . CAD (coronary artery disease) 06/26/2012    PTCA RCA 1995 CABG w. LIMA to LAD,  SVG to dx 1, PD and PL March 2000 Dr. Servando Snare for 3VD Cypher stent SVG to RCA  2005 redo CABG 07/2005 with SVG to OM and SVG to PD-PL   DR. TILLEY IS PT'S CARDIOLOGIST  . Memory difficulties   . Hearing loss     bilateral  . Arthritis     Medications:  Scheduled:  . amLODipine  5 mg Oral q morning - 10a  . aspirin EC  81 mg Oral Daily  . cholecalciferol  1,000 Units Oral q morning - 10a  . clopidogrel  75 mg Oral q morning - 10a  . docusate sodium  100 mg Oral BID  . finasteride  5 mg Oral q morning -  10a  . latanoprost  1 drop Both Eyes QHS  . memantine  21 mg Oral Daily  . metoprolol  75 mg Oral BID  . senna  1 tablet Oral BID  . timolol  1 drop Both Eyes Daily   Infusions:  . heparin 1,050 Units/hr (09/07/14 0700)  . nitroGLYCERIN 35 mcg/min (09/07/14 0700)    Assessment: 59 yoM admitted on 3/3 for Left total hip arthroplasty, anterior approach.  PMH includes CAD s/p CABG/PCI, stenting, and chronic stable angina.  Post-op he developed angina and has elevated troponins.  Cardiology has consulted pharmacy to dose heparin drip.  Per notes, ortho agrees with IV heparin.   CBC: Hgb improved s/p blood yesterday, plts up slightly to 68 from 65k.  Rn confirms no active bleeding noted.  Troponin: 0.26, 0.49, 0.76, 0.95, 1.86, 1.53  SCr 1.16, CrCl ~ 43 ml/min  Baseline INR 1.39 (3/3) and aPTT 30 (2/25)  Heparin level undetectable despite increase in heparin rate last night  Goal of Therapy:  Heparin level 0.3-0.7 units/ml Monitor platelets by anticoagulation protocol: Yes   Plan:  1) Give IV heparin bolus of 2000 units 2) Increase heparin rate from 1050 units/hr to 1300 units/hr 3) Recheck heparin level 8 hours after bolus and rate increase  Adrian Saran, PharmD, BCPS Pager [phone] 09/07/2014 9:23 AM

## 2014-09-07 NOTE — Progress Notes (Signed)
ANTICOAGULATION CONSULT NOTE - Follow Up  Pharmacy Consult for Heparin Indication: ACS  Infusions:  . heparin 1,300 Units/hr (09/07/14 1700)  . nitroGLYCERIN Stopped (09/07/14 1548)    Assessment: 20 yoM admitted on 3/3 for Left total hip arthroplasty, anterior approach.  PMH includes CAD s/p CABG/PCI, stenting, and chronic stable angina.  Post-op he developed angina and has elevated troponins.  Cardiology has consulted pharmacy to dose heparin drip.  Per notes, ortho agrees with IV heparin.  3/6 1800 Lab interface with [facility] is currently down and not able to report electronically. Lab verbally confirmed 1800 Heparin level 0.18, subtherapeutic, but increased from previously undetectable levels.  RN reports no IV issues, bleeding, or complications.  Goal of Therapy:  Heparin level 0.3-0.7 units/ml Monitor platelets by anticoagulation protocol: Yes   Plan:   Give heparin 2000 units bolus IV x 1  Increase to heparin IV infusion at 1550 units/hr (15.5 ml/hr)  Heparin level 8 hours after rate change  Daily heparin level and CBC   Gretta Arab PharmD, BCPS Pager [phone] 09/07/2014 7:56 PM

## 2014-09-07 NOTE — Progress Notes (Addendum)
   Subjective:  Patient reports pain as mild.  Denies N/V/CP/SOB. On nitro and heparin gtt in ICU.  Objective:   VITALS:   Filed Vitals:   09/07/14 0500 09/07/14 0600 09/07/14 0700 09/07/14 0800  BP: 136/63 132/52 146/55   Pulse: 70 72 71   Temp:    97.3 F (36.3 C)  TempSrc:    Oral  Resp: 21 17 21    Height:      Weight:      SpO2: 96% 97% 96%     Neurologically intact ABD soft Sensation intact distally Intact pulses distally Dorsiflexion/Plantar flexion intact Incision: dressing C/D/I No hematoma thigh soft  Lab Results  Component Value Date   WBC 9.9 09/07/2014   HGB 10.1* 09/07/2014   HCT 30.9* 09/07/2014   MCV 85.4 09/07/2014   PLT 68* 09/07/2014   BMET    Component Value Date/Time   NA 138 09/06/2014 0640   K 3.5 09/06/2014 0640   CL 110 09/06/2014 0640   CO2 24 09/06/2014 0640   GLUCOSE 106* 09/06/2014 0640   BUN 13 09/06/2014 0640   CREATININE 1.16 09/06/2014 0640   CALCIUM 7.6* 09/06/2014 0640   GFRNONAA 56* 09/06/2014 0640   GFRAA 64* 09/06/2014 0640     Assessment/Plan: 3 Days Post-Op   Principal Problem:   Osteoarthritis of left hip Active Problems:   CAD (coronary artery disease)   S/P CABG (coronary artery bypass graft)   Thrombocytopenia   Primary osteoarthritis of left hip   Angina pectoris   Elevated troponin   NSTEMI (non-ST elevated myocardial infarction)  WBAT with walker DVT ppx: on heparin gtt for NSTEMI, ASA 81, plavix Up with therapy today, troponins trending down per cardiology Dispo: d/c home when clears PT and medically cleared   Cullin Dishman, Horald Pollen 09/07/2014, 9:31 AM   Rod Can, MD Cell [phone]

## 2014-09-07 NOTE — Progress Notes (Addendum)
Patient up with PT. States he started to feel dizzy. Sat patient down and BP 61/19. Nitro gtt turned off. Got patient back to room and within 30 min BP up to 138/43. Paged cardiology, told to leave Nitro gtt off for now.

## 2014-09-07 NOTE — Progress Notes (Signed)
Physical Therapy Treatment Patient Details Name: [name] MRN: [id number] DOB: Oct 15, 1926 Today's Date: 09/07/2014    History of Present Illness L THR    PT Comments    Pt seen this pm with RN stating approved by cardiology.  Pt v5ery cooperative but c/o sudden onset weakness while ambulating.  Pt sat in chair and RN alerted and assessing - BP 61/35.  Pt transported back to room in recliner with RN attending    Follow Up Recommendations  Home health PT     Equipment Recommendations  None recommended by PT    Recommendations for Other Services OT consult     Precautions / Restrictions Precautions Precautions: Fall Restrictions Weight Bearing Restrictions: Yes LLE Weight Bearing: Weight bearing as tolerated    Mobility  Bed Mobility Overal bed mobility: Needs Assistance Bed Mobility: Supine to Sit     Supine to sit: Min assist     General bed mobility comments: cues for sequence and use of R LE to self assist  Transfers Overall transfer level: Needs assistance Equipment used: Rolling walker (2 wheeled) Transfers: Sit to/from Stand Sit to Stand: Min assist         General transfer comment: cues for LE management and use of UEs to self assist  Ambulation/Gait Ambulation/Gait assistance: Min assist Ambulation Distance (Feet): 74 Feet Assistive device: Rolling walker (2 wheeled) Gait Pattern/deviations: Step-to pattern;Step-through pattern;Decreased step length - right;Decreased step length - left;Shuffle;Trunk flexed Gait velocity: mod pace   General Gait Details: cues for posture, position from RW, and initial sequence   Stairs            Wheelchair Mobility    Modified Rankin (Stroke Patients Only)       Balance                                    Cognition Arousal/Alertness: Awake/alert Behavior During Therapy: WFL for tasks assessed/performed Overall Cognitive Status: Within Functional Limits for tasks assessed                      Exercises Total Joint Exercises Ankle Circles/Pumps: AROM;Both;15 reps;Supine Quad Sets: AROM;Both;10 reps;Supine Heel Slides: 20 reps;Left;Supine;AAROM;AROM Hip ABduction/ADduction: AAROM;Left;Supine;15 reps    General Comments        Pertinent Vitals/Pain Pain Assessment: 0-10 Pain Score: 6  (With WB, min c/o pain at rest) Pain Location: L hip/groin Pain Descriptors / Indicators: Aching;Sore Pain Intervention(s): Limited activity within patient's tolerance;Monitored during session;Premedicated before session    Home Living                      Prior Function            PT Goals (current goals can now be found in the care plan section) Acute Rehab PT Goals Patient Stated Goal: resume previous lifestyle with decreased pain PT Goal Formulation: With patient Time For Goal Achievement: 09/12/14 Potential to Achieve Goals: Good Progress towards PT goals: Progressing toward goals    Frequency  7X/week    PT Plan Current plan remains appropriate    Co-evaluation             End of Session Equipment Utilized During Treatment: Gait belt Activity Tolerance: Patient limited by fatigue;Other (comment) Patient left: in chair;with call bell/phone within reach;with family/visitor present     Time: 1696-7893 PT Time Calculation (min) (ACUTE ONLY): 29 min  Charges:  $Gait Training: 8-22 mins $Therapeutic Exercise: 8-22 mins                    G Codes:      [name] 09/28/2014, 4:20 PM

## 2014-09-07 NOTE — Progress Notes (Signed)
Pt has now had two episodes of chest pain each relieved by one SL NTG.  Notified NP with hospitalist service.  EKG unchanged, H&H ordered per order.  NP will assess at bedside when available.

## 2014-09-07 NOTE — Progress Notes (Signed)
OT Cancellation Note  Patient Details Name: [name] MRN: [id number] DOB: 1926/10/21   Cancelled Treatment:    Reason Eval/Treat Not Completed: Medical issues which prohibited therapy -- no new troponin level, await downward trend to initiate OT.  [name] A 09/07/2014, 9:18 AM

## 2014-09-07 NOTE — Progress Notes (Signed)
Patient Name: [name]      SUBJECTIVE:81 yo admitted with hip apin and underwent anterior hip replacement.  Postoperative he had anginal pain with + Tn;  Seen yday by Dr Wynonia Lawman who knows him well Tn unfortunately have continued to rise. His thoughts were, if this turned out to be the case, to treat with Heparin, presuming ok from surgery, and IV NTG;  He is already on clopidigrel  He looks much better today and says feels about the same without chest pain now for 48 hrs or sob    He has a history of significant coronary artery disease with previous bypass grafting twice and has had multiple previous percutaneous interventions. His last catheterization was done in August 2014 when he continued to have angina. At that time he had a previously patent left main stent, a 70-80% angulated diagonal stenosis with occlusion of the vein graft, collateral filling of the distal right coronary circulation left coronary artery, occluded distal LAD, patent circumflex, patent proximal right coronary artery stent with occlusion of the distal vessel. He had a vein graft to the diagonal that was occluded at that time and a patent mammary graft. Since that time he has continued to have angina oftentimes occurring at rest that he will use nitroglycerin for at home and is on maximum medical therapy. He normally gets along well but does have angina on a weekly basis.    Past Medical History  Diagnosis Date  . H/O prostate cancer     Prior cryoablation   . Hyperlipidemia 06/26/2012  . S/P CABG (coronary artery bypass graft)   . Villous adenoma of rectum   . Anxiety   . CAD (coronary artery disease) 06/26/2012    PTCA RCA 1995 CABG w. LIMA to LAD, SVG to dx 1, PD and PL March 2000 Dr. Servando Snare for 3VD Cypher stent SVG to RCA  2005 redo CABG 07/2005 with SVG to OM and SVG to PD-PL   DR. TILLEY IS PT'S CARDIOLOGIST  . Memory difficulties   . Hearing loss     bilateral  . Arthritis      Scheduled Meds:  Scheduled Meds: . amLODipine  5 mg Oral q morning - 10a  . aspirin EC  81 mg Oral Daily  . cholecalciferol  1,000 Units Oral q morning - 10a  . clopidogrel  75 mg Oral q morning - 10a  . docusate sodium  100 mg Oral BID  . finasteride  5 mg Oral q morning - 10a  . latanoprost  1 drop Both Eyes QHS  . memantine  21 mg Oral Daily  . metoprolol  75 mg Oral BID  . senna  1 tablet Oral BID  . timolol  1 drop Both Eyes Daily   Continuous Infusions: . heparin 1,050 Units/hr (09/07/14 0700)  . nitroGLYCERIN 35 mcg/min (09/07/14 0700)   acetaminophen **OR** acetaminophen, diphenhydrAMINE, HYDROcodone-acetaminophen, menthol-cetylpyridinium **OR** phenol, methocarbamol **OR** methocarbamol (ROBAXIN)  IV, metoCLOPramide **OR** metoCLOPramide (REGLAN) injection, morphine injection, nitroGLYCERIN, ondansetron **OR** ondansetron (ZOFRAN) IV    PHYSICAL EXAM Filed Vitals:   09/07/14 0400 09/07/14 0500 09/07/14 0600 09/07/14 0700  BP: 129/55 136/63 132/52 146/55  Pulse: 71 70 72 71  Temp:      TempSrc:      Resp: 23 21 17 21   Height:      Weight:      SpO2: 97% 96% 97% 96%  Well developed and nourished in no acute distress HENT normal  Neck supple with JVP-flat Clear Regular rate and rhythm, no murmurs or gallops Abd-soft with active BS No Clubbing cyanosis edema Skin-warm and dry A & Oriented  Grossly normal sensory and motor function  TELEMETRY: Reviewed telemetry pt in *sinus tach   Intake/Output Summary (Last 24 hours) at 09/07/14 0831 Last data filed at 09/07/14 0700  Gross per 24 hour  Intake 1265.2 ml  Output   2350 ml  Net -1084.8 ml    LABS: Basic Metabolic Panel:  Recent Labs Lab 09/05/14 0435 09/06/14 0640  NA 141 138  K 4.0 3.5  CL 109 110  CO2 23 24  GLUCOSE 149* 106*  BUN 20 13  CREATININE 1.46* 1.16  CALCIUM 8.2* 7.6*   Cardiac Enzymes:  Recent Labs  09/05/14 1850 09/06/14 0050 09/06/14 0640  TROPONINI 0.76* 0.95* 1.86*    CBC:  Recent Labs Lab 09/05/14 0435 09/05/14 1850 09/06/14 0640 09/07/14 0400  WBC 22.7* 16.3* 10.3 9.9  NEUTROABS  --  12.6*  --   --   HGB 9.8* 9.1* 8.5* 10.1*  HCT 29.0* 28.0* 25.9* 30.9*  MCV 85.3 87.5 84.9 85.4  PLT 105* 112* 65* 68*   PROTIME:  Recent Labs  09/04/14 1130  LABPROT 17.2*  INR 1.39     ASSESSMENT AND PLAN:  Principal Problem:   Osteoarthritis of left hip Active Problems:   CAD (coronary artery disease)   S/P CABG (coronary artery bypass graft)   Thrombocytopenia   Primary osteoarthritis of left hip   Angina pectoris   Elevated troponin   NSTEMI (non-ST elevated myocardial infarction)  Tolerated transfusion Thrombocytopenia  Has recurred  Began prior to  heparin continue IV NTG continue heparin for 24 hrs Decrease ASA  tachycardia improved following transfusion  Will check one more troponin to make sure we have reached [place] Then Mobilization per ortho  Signed, Virl Axe MD  09/07/2014

## 2014-09-08 LAB — HEPARIN LEVEL (UNFRACTIONATED)
HEPARIN UNFRACTIONATED: 0.18 [IU]/mL — AB (ref 0.30–0.70)
Heparin Unfractionated: 0.28 IU/mL — ABNORMAL LOW (ref 0.30–0.70)

## 2014-09-08 LAB — CBC
HEMATOCRIT: 32.9 % — AB (ref 39.0–52.0)
Hemoglobin: 10.8 g/dL — ABNORMAL LOW (ref 13.0–17.0)
MCH: 27.9 pg (ref 26.0–34.0)
MCHC: 32.8 g/dL (ref 30.0–36.0)
MCV: 85 fL (ref 78.0–100.0)
Platelets: 80 10*3/uL — ABNORMAL LOW (ref 150–400)
RBC: 3.87 MIL/uL — ABNORMAL LOW (ref 4.22–5.81)
RDW: 15.3 % (ref 11.5–15.5)
WBC: 9.9 10*3/uL (ref 4.0–10.5)

## 2014-09-08 LAB — TROPONIN I
Troponin I: 0.69 ng/mL (ref <0.031)
Troponin I: 0.78 ng/mL (ref <0.031)
Troponin I: 0.67 ng/mL (ref <0.031)

## 2014-09-08 MED ORDER — METOPROLOL TARTRATE 50 MG PO TABS
100.0000 mg | ORAL_TABLET | Freq: Two times a day (BID) | ORAL | Status: DC
  Administered 2014-09-08 – 2014-09-11 (×7): 100 mg via ORAL
  Filled 2014-09-08: qty 2
  Filled 2014-09-08 (×2): qty 4
  Filled 2014-09-08: qty 2
  Filled 2014-09-08 (×4): qty 4

## 2014-09-08 MED ORDER — ISOSORBIDE MONONITRATE ER 30 MG PO TB24
120.0000 mg | ORAL_TABLET | Freq: Every day | ORAL | Status: DC
  Administered 2014-09-08 – 2014-09-11 (×4): 120 mg via ORAL
  Filled 2014-09-08 (×3): qty 2
  Filled 2014-09-08: qty 4

## 2014-09-08 MED ORDER — HEPARIN BOLUS VIA INFUSION
1000.0000 [IU] | Freq: Once | INTRAVENOUS | Status: AC
  Administered 2014-09-08: 1000 [IU] via INTRAVENOUS
  Filled 2014-09-08: qty 1000

## 2014-09-08 MED ORDER — HEPARIN (PORCINE) IN NACL 100-0.45 UNIT/ML-% IJ SOLN
1600.0000 [IU]/h | INTRAMUSCULAR | Status: DC
  Administered 2014-09-08 – 2014-09-09 (×2): 1600 [IU]/h via INTRAVENOUS
  Filled 2014-09-08 (×2): qty 250

## 2014-09-08 NOTE — Progress Notes (Signed)
   Subjective:  Patient reports pain as mild.  Denies N/V/CP/SOB. On heparin gtt in ICU. Hypotension with PT session yesterday, nitro gtt now held.  Objective:   VITALS:   Filed Vitals:   09/08/14 0000 09/08/14 0200 09/08/14 0400 09/08/14 0600  BP: 128/50 144/42 144/57 141/55  Pulse: 65 74 67 67  Temp: 98 F (36.7 C)  98.2 F (36.8 C)   TempSrc:      Resp: 27 20 20 16   Height:      Weight:      SpO2: 97% 98% 98% 96%    Neurologically intact ABD soft Sensation intact distally Intact pulses distally Dorsiflexion/Plantar flexion intact Incision: dressing C/D/I No hematoma thigh soft  Lab Results  Component Value Date   WBC 9.9 09/08/2014   HGB 10.8* 09/08/2014   HCT 32.9* 09/08/2014   MCV 85.0 09/08/2014   PLT 80* 09/08/2014   BMET    Component Value Date/Time   NA 138 09/06/2014 0640   K 3.5 09/06/2014 0640   CL 110 09/06/2014 0640   CO2 24 09/06/2014 0640   GLUCOSE 106* 09/06/2014 0640   BUN 13 09/06/2014 0640   CREATININE 1.16 09/06/2014 0640   CALCIUM 7.6* 09/06/2014 0640   GFRNONAA 56* 09/06/2014 0640   GFRAA 64* 09/06/2014 0640     Assessment/Plan: 4 Days Post-Op   Principal Problem:   Osteoarthritis of left hip Active Problems:   CAD (coronary artery disease)   S/P CABG (coronary artery bypass graft)   Thrombocytopenia   Primary osteoarthritis of left hip   Angina pectoris   Elevated troponin   NSTEMI (non-ST elevated myocardial infarction)  WBAT with walker DVT ppx: on heparin gtt for NSTEMI, ASA 81, plavix Up with therapy Dispo: d/c home when clears PT and medically cleared   Nastassia Bazaldua, Horald Pollen 09/08/2014, 7:16 AM   Rod Can, MD Cell [phone]

## 2014-09-08 NOTE — Progress Notes (Signed)
Physical Therapy Treatment Patient Details Name: [name] MRN: [id number] DOB: [birth year] Today's Date: 09/08/2014    History of Present Illness L THR    PT Comments    POD # 4 am session pt seen in ICU.  Closely monitoring vitals throughout session: Supine:   BP 112/49(70), HR 72, RA 96% EOB:      BP 101/80(84), HR 83, RA 96% Standing:BP 156/62(82), HR 76, RA 98% amb 50" BP 135/63(82), HR 88, RA 99% Pt tolerated session well. NO c/o chest pain. Feeling "better".  NO c/o hip pain just "sore".  Pt tolerated amb a great distance then performed THR TE's while in recliner followed by ICE.  Follow Up Recommendations  Home health PT     Equipment Recommendations  None recommended by PT    Recommendations for Other Services       Precautions / Restrictions Precautions Precautions: Fall Precaution Comments: monitor BP's Restrictions Weight Bearing Restrictions: No LLE Weight Bearing: Weight bearing as tolerated    Mobility  Bed Mobility Overal bed mobility: Needs Assistance Bed Mobility: Supine to Sit     Supine to sit: Min guard;Min assist     General bed mobility comments: cues for sequence and use of R LE to self assist plus increased time   Transfers Overall transfer level: Needs assistance Equipment used: Rolling walker (2 wheeled) Transfers: Sit to/from Stand Sit to Stand: Min assist         General transfer comment: cues for LE management and use of UEs to self assist  Ambulation/Gait Ambulation/Gait assistance: +2 safety/equipment;Min assist Ambulation Distance (Feet): 138 Feet Assistive device: Rolling walker (2 wheeled) Gait Pattern/deviations: Step-to pattern;Step-through pattern;Trunk flexed Gait velocity: functional   General Gait Details: cues for posture, position from RW, and initial sequence.  closely monitoring BP's   Stairs            Wheelchair Mobility    Modified Rankin (Stroke Patients Only)       Balance                                     Cognition Arousal/Alertness: Awake/alert Behavior During Therapy: WFL for tasks assessed/performed Overall Cognitive Status: Within Functional Limits for tasks assessed                      Exercises   Total Hip Replacement TE's 10 reps ankle pumps 10 reps knee presses 10 reps heel slides 10 reps SAQ's 10 reps ABD Followed by ICE     General Comments        Pertinent Vitals/Pain Pain Assessment: No/denies pain Pain Location: L groin "sore" Pain Descriptors / Indicators: Aching;Sore Pain Intervention(s): Monitored during session;Repositioned;Ice applied    Home Living                      Prior Function            PT Goals (current goals can now be found in the care plan section) Progress towards PT goals: Progressing toward goals    Frequency       PT Plan Current plan remains appropriate    Co-evaluation             End of Session Equipment Utilized During Treatment: Gait belt Activity Tolerance: Patient tolerated treatment well Patient left: in chair;with call bell/phone within reach     Time: [phone]  PT Time Calculation (min) (ACUTE ONLY): 25 min  Charges:  $Gait Training: 8-22 mins $Therapeutic Exercise: 8-22 mins                    G Codes:      Rica Koyanagi  PTA [facility]  Acute  Rehab Pager      [phone]

## 2014-09-08 NOTE — Progress Notes (Addendum)
ANTICOAGULATION CONSULT NOTE - Follow Up  Pharmacy Consult for Heparin Indication: ACS  Allergies  Allergen Reactions  . Dilaudid [Hydromorphone Hcl]     Prefers not to have"causes paranoia"  . Fenofibrate Other (See Comments)    Reaction unknown  . Gemfibrozil Other (See Comments)    Reaction unknown  . Ranexa [Ranolazine] Other (See Comments)    Reaction unknown   . Red Yeast Rice [Cholestin] Other (See Comments)    Reaction unknown   . Statins Other (See Comments)    Muscle pain  . Zetia [Ezetimibe] Other (See Comments)    Reaction unknown     Patient Measurements: Height: 5\' 7"  (170.2 cm) Weight: 173 lb (78.472 kg) IBW/kg (Calculated) : 66.1 Heparin Dosing Weight: actual weight  Vital Signs: Temp: 98.1 F (36.7 C) (03/07 0800) Temp Source: Oral (03/07 0800) BP: 144/68 mmHg (03/07 0800) Pulse Rate: 81 (03/07 0800)  Labs:  Recent Labs  09/06/14 0640  09/07/14 0400 09/07/14 0818 09/07/14 1806 09/07/14 2215 09/08/14 0109 09/08/14 0428 09/08/14 0715  HGB 8.5*  --  10.1*  --   --  10.8*  --  10.8*  --   HCT 25.9*  --  30.9*  --   --  32.0*  --  32.9*  --   PLT 65*  --  68*  --   --   --   --  80*  --   HEPARINUNFRC  --   < >  --  <0.10* 0.18*  --   --  0.28*  --   CREATININE 1.16  --   --   --   --   --   --   --   --   TROPONINI 1.86*  --   --  1.53*  --   --  0.69*  --  0.78*  < > = values in this interval not displayed.  Estimated Creatinine Clearance: 43.5 mL/min (by C-G formula based on Cr of 1.16).   Medical History: Past Medical History  Diagnosis Date  . H/O prostate cancer     Prior cryoablation   . Hyperlipidemia 06/26/2012  . S/P CABG (coronary artery bypass graft)   . Villous adenoma of rectum   . Anxiety   . CAD (coronary artery disease) 06/26/2012    PTCA RCA 1995 CABG w. LIMA to LAD, SVG to dx 1, PD and PL March 2000 Dr. Servando Snare for 3VD Cypher stent SVG to RCA  2005 redo CABG 07/2005 with SVG to OM and SVG to PD-PL   DR. TILLEY IS  PT'S CARDIOLOGIST  . Memory difficulties   . Hearing loss     bilateral  . Arthritis     Medications:  Scheduled:  . amLODipine  5 mg Oral q morning - 10a  . aspirin EC  81 mg Oral Daily  . cholecalciferol  1,000 Units Oral q morning - 10a  . clopidogrel  75 mg Oral q morning - 10a  . docusate sodium  100 mg Oral BID  . finasteride  5 mg Oral q morning - 10a  . latanoprost  1 drop Both Eyes QHS  . memantine  21 mg Oral Daily  . metoprolol  100 mg Oral BID  . senna  1 tablet Oral BID  . sodium chloride  3 mL Intravenous Q12H  . timolol  1 drop Both Eyes Daily   Infusions:  . heparin 1,550 Units/hr (09/07/14 2057)    Assessment: 87 yoM admitted on 3/3 for Left total  hip arthroplasty, anterior approach.  PMH includes CAD s/p CABG/PCI, stenting, and chronic stable angina.  Post-op he developed angina and has elevated troponins.  Cardiology has consulted pharmacy to dose heparin drip.  Per notes, ortho agrees with IV heparin.  Baseline INR 1.39 (3/3) and aPTT 30 (2/25) Baseline Plt low (60s-110s) Already on ASA/Plavix for CAD Hx  Today, 09/08/2014:  CBC: Txfused 2 units PRBC post-op.  Hgb sl low but stable.  Plt low at baseline, currently stable  Troponin: peaked at 1.86 on 3/5, now trending down to 0.78  SCr 1.16 (3/5), CrCl ~ 43 ml/min  HL just below therapeutic range on 1550 units/hr   No bleeding or line issues noted by nursing or patient  Did have episode of angina this morning  Goal of Therapy:  Heparin level 0.3-0.7 units/ml Monitor platelets by anticoagulation protocol: Yes   Plan:   Heparin 1000 units bolus from bag x 1  Increase heparin rate to 1600 units/hr (16 ml/hr).  Will check level with daily labs tomorrow AM  Daily CBC, HL.  Monitor for signs of bleeding   Reuel Boom, PharmD Pager: [phone] 09/08/2014, 9:26 AM

## 2014-09-08 NOTE — Progress Notes (Signed)
OT Cancellation Note  Patient Details Name: [name] MRN: [id number] DOB: 1927/06/05   Cancelled Treatment:    Reason Eval/Treat Not Completed: Fatigue/lethargy limiting ability to participate  Pt had just gotten to bed. Will check on pt next day.  Betsy Pries 09/08/2014, 2:13 PM

## 2014-09-08 NOTE — Progress Notes (Signed)
Subjective:  1 episode of chest discomfort this morning.  Blood pressure is somewhat elevated.  Objective:  Vital Signs in the last 24 hours: BP 144/68 mmHg  Pulse 81  Temp(Src) 98.1 F (36.7 C) (Oral)  Resp 14  Ht 5\' 7"  (1.702 m)  Wt 78.472 kg (173 lb)  BMI 27.09 kg/m2  SpO2 94%  Physical Exam: Pleasant elderly male in no acute distress Lungs:  Clear Cardiac:  Regular rhythm, normal S1 and S2, no S3, 2/6 murmur Abdomen:  Soft, nontender, no masses Extremities:  No edema present  Intake/Output from previous day: 03/06 0701 - 03/07 0700 In: 1111.6 [P.O.:660; I.V.:451.6] Out: 650 [Urine:650]  Weight Filed Weights   09/04/14 1715  Weight: 78.472 kg (173 lb)    Lab Results: Basic Metabolic Panel:  Recent Labs  09/06/14 0640  NA 138  K 3.5  CL 110  CO2 24  GLUCOSE 106*  BUN 13  CREATININE 1.16   CBC:  Recent Labs  09/05/14 1850  09/07/14 0400 09/07/14 2215 09/08/14 0428  WBC 16.3*  < > 9.9  --  9.9  NEUTROABS 12.6*  --   --   --   --   HGB 9.1*  < > 10.1* 10.8* 10.8*  HCT 28.0*  < > 30.9* 32.0* 32.9*  MCV 87.5  < > 85.4  --  85.0  PLT 112*  < > 68*  --  80*  < > = values in this interval not displayed. Cardiac Panel (last 3 results)  Recent Labs  09/07/14 0818 09/08/14 0109 09/08/14 0715  TROPONINI 1.53* 0.69* 0.78*    Telemetry: Currently normal sinus rhythm  Assessment/Plan:  1.  Non-STEMI occurring in the postoperative setting-she continues to have episodes of angina however.  Last episode was this morning.  The episode on Friday associated with ST depression laterally. 2.  Severe coronary artery disease with previous redo bypass grafting in several stenting procedures 3.  Otherwise he appears to be stable  following his hip surgery.    Recommendations:  He had an episode of angina this morning.  I will increase his beta blockers and continue heparin.  If he continues to have episodes of chest pain despite heparin and increase beta  blockers may need to relook at the previously placed stents in his left main.  There were patent when they were last looked at however.  I will restart his isosorbide mononitrate.  This has helped him in the past.  He is currently off of it   W. Tollie Eth, Brooke Bonito.  MD [facility]  09/08/2014, 9:20 AM

## 2014-09-09 LAB — CBC
HEMATOCRIT: 26.5 % — AB (ref 39.0–52.0)
Hemoglobin: 8.6 g/dL — ABNORMAL LOW (ref 13.0–17.0)
MCH: 27.7 pg (ref 26.0–34.0)
MCHC: 32.5 g/dL (ref 30.0–36.0)
MCV: 85.5 fL (ref 78.0–100.0)
Platelets: 96 10*3/uL — ABNORMAL LOW (ref 150–400)
RBC: 3.1 MIL/uL — ABNORMAL LOW (ref 4.22–5.81)
RDW: 15 % (ref 11.5–15.5)
WBC: 8.4 10*3/uL (ref 4.0–10.5)

## 2014-09-09 LAB — HEPARIN LEVEL (UNFRACTIONATED): Heparin Unfractionated: 0.25 IU/mL — ABNORMAL LOW (ref 0.30–0.70)

## 2014-09-09 LAB — PREPARE RBC (CROSSMATCH)

## 2014-09-09 MED ORDER — FUROSEMIDE 10 MG/ML IJ SOLN
20.0000 mg | Freq: Once | INTRAMUSCULAR | Status: AC
  Administered 2014-09-09: 20 mg via INTRAVENOUS
  Filled 2014-09-09: qty 2

## 2014-09-09 MED ORDER — HEPARIN (PORCINE) IN NACL 100-0.45 UNIT/ML-% IJ SOLN
1750.0000 [IU]/h | INTRAMUSCULAR | Status: DC
  Filled 2014-09-09: qty 250

## 2014-09-09 MED ORDER — ENOXAPARIN SODIUM 40 MG/0.4ML ~~LOC~~ SOLN
40.0000 mg | SUBCUTANEOUS | Status: DC
  Administered 2014-09-10: 40 mg via SUBCUTANEOUS
  Filled 2014-09-09 (×2): qty 0.4

## 2014-09-09 MED ORDER — ENOXAPARIN SODIUM 40 MG/0.4ML ~~LOC~~ SOLN: 40.0000 mg | SUBCUTANEOUS | Status: DC

## 2014-09-09 MED ORDER — SODIUM CHLORIDE 0.9 % IV SOLN
Freq: Once | INTRAVENOUS | Status: AC
  Administered 2014-09-09: 11:00:00 via INTRAVENOUS

## 2014-09-09 NOTE — Progress Notes (Signed)
OT Cancellation Note  Patient Details Name: [name] MRN: [id number] DOB: 1927-05-29   Cancelled Treatment:    Reason Eval/Treat Not Completed: Other (comment).  Noted plan is for 2 units of blood today.  Will check back later today, if schedule permits or tomorrow  Lillard Bailon 09/09/2014, 12:33 PM  Lesle Chris, OTR/L [phone] 09/09/2014

## 2014-09-09 NOTE — Progress Notes (Signed)
ANTICOAGULATION CONSULT NOTE - Follow Up Consult  Pharmacy Consult for Heparin Indication: ACS  Allergies  Allergen Reactions  . Dilaudid [Hydromorphone Hcl]     Prefers not to have"causes paranoia"  . Fenofibrate Other (See Comments)    Reaction unknown  . Gemfibrozil Other (See Comments)    Reaction unknown  . Ranexa [Ranolazine] Other (See Comments)    Reaction unknown   . Red Yeast Rice [Cholestin] Other (See Comments)    Reaction unknown   . Statins Other (See Comments)    Muscle pain  . Zetia [Ezetimibe] Other (See Comments)    Reaction unknown     Patient Measurements: Height: 5\' 7"  (170.2 cm) Weight: 173 lb (78.472 kg) IBW/kg (Calculated) : 66.1 Heparin Dosing Weight:   Vital Signs: Temp: 98.3 F (36.8 C) (03/08 0400) Temp Source: Oral (03/08 0400) BP: 142/55 mmHg (03/08 0400) Pulse Rate: 77 (03/08 0400)  Labs:  Recent Labs  09/06/14 0640  09/07/14 0400  09/07/14 1806 09/07/14 2215 09/08/14 0109 09/08/14 0428 09/08/14 0715 09/08/14 1340 09/09/14 0415  HGB 8.5*  --  10.1*  --   --  10.8*  --  10.8*  --   --  8.6*  HCT 25.9*  --  30.9*  --   --  32.0*  --  32.9*  --   --  26.5*  PLT 65*  --  68*  --   --   --   --  80*  --   --  96*  HEPARINUNFRC  --   < >  --   < > 0.18*  --   --  0.28*  --   --  0.25*  CREATININE 1.16  --   --   --   --   --   --   --   --   --   --   TROPONINI 1.86*  --   --   < >  --   --  0.69*  --  0.78* 0.67*  --   < > = values in this interval not displayed.  Estimated Creatinine Clearance: 43.5 mL/min (by C-G formula based on Cr of 1.16).   Medications:  Infusions:  . heparin      Assessment: Patient with low heparin level again.  No issues per RN.  Goal of Therapy:  Heparin level 0.3-0.7 units/ml Monitor platelets by anticoagulation protocol: Yes   Plan:  Increase heparin to 1750 units/hr Recheck level at [place], Chyanna Flock [place] 09/09/2014,5:30 AM

## 2014-09-09 NOTE — Progress Notes (Addendum)
Subjective:  Having recurrent chest pain this morning and given nitroglycerin again.  Hemoglobin has dropped back down to 8.6 today that may be responsible for some of the angina.  Objective:  Vital Signs in the last 24 hours: BP 119/30 mmHg  Pulse 72  Temp(Src) 98.3 F (36.8 C) (Oral)  Resp 20  Ht 5\' 7"  (1.702 m)  Wt 78.472 kg (173 lb)  BMI 27.09 kg/m2  SpO2 98%  Physical Exam: Pleasant elderly male in no acute distress Lungs:  Clear Cardiac:  Regular rhythm, normal S1 and S2, no S3, 2/6 murmur Abdomen:  Soft, nontender, no masses Extremities:  No edema present  Intake/Output from previous day: 03/07 0701 - 03/08 0700 In: 745.7 [P.O.:120; I.V.:625.7] Out: 850 [Urine:850]  Weight Filed Weights   09/04/14 1715  Weight: 78.472 kg (173 lb)    Lab Results:  CBC:  Recent Labs  09/08/14 0428 09/09/14 0415  WBC 9.9 8.4  HGB 10.8* 8.6*  HCT 32.9* 26.5*  MCV 85.0 85.5  PLT 80* 96*   Cardiac Panel (last 3 results)  Recent Labs  09/08/14 0109 09/08/14 0715 09/08/14 1340  TROPONINI 0.69* 0.78* 0.67*    Telemetry: Currently normal sinus rhythm  Assessment/Plan:  1.  Non-STEMI occurring in the postoperative setting-he continues to have episodes of angina however.  Last episode was this morning.   2.  Severe coronary artery disease with previous redo bypass grafting in several stenting procedures 3. Postoperative anemia with blood loss again noted today.  This may be worse with the intravenous heparin.    Recommendations:  he has been placed back on isosorbide.  I would transfuse him 2 units to see if this will help the angina.  He has a number of places in his arteries that were not amenable to percutaneous intervention.  I will have the interventional cardiologist look at his old films to determine if a repeat cath be of any value or not.  Hopefully we can get this settled down.  May stop the heparin after he gets some blood to see if he will continue to have  angina or not after his hemoglobin is up.  Kerry Hough  MD [facility]  09/09/2014, 8:55 AM

## 2014-09-09 NOTE — Progress Notes (Signed)
PT Cancellation Note  Patient Details Name: ARSENIY TOOMEY MRN: [id number] DOB: [birth year]   Cancelled Treatment:     will hold off PT am session till after first unit.  Will plan to see this afternoon.   Rica Koyanagi  PTA [facility]  Acute  Rehab Pager      [phone]

## 2014-09-09 NOTE — Evaluation (Signed)
Occupational Therapy Evaluation Patient Details Name: [name] MRN: [id number] DOB: 1927/06/06 Today's Date: 09/09/2014    History of Present Illness L THR w/ post op angina   Clinical Impression   This 81 year old man was admitted for L DA THA.  He developed post op angina and is s/p non-STEMI.  Pt was independent with adls prior to admission.  Wife plans to assist with LB ADLs at home. Will follow in acute to continue education on safe bathroom transfers and further assess DME needs for toilet.    Follow Up Recommendations  Supervision/Assistance - 24 hour    Equipment Recommendations   (to be further assessed for 3:1; pt has high commode)    Recommendations for Other Services       Precautions / Restrictions Precautions Precautions: Fall Precaution Comments: monitor sats Restrictions Weight Bearing Restrictions: No LLE Weight Bearing: Weight bearing as tolerated      Mobility Bed Mobility Overal bed mobility: Needs Assistance Bed Mobility: Sit to Supine     Supine to sit: Min assist     General bed mobility comments: supported LLE  Transfers Overall transfer level: Needs assistance Equipment used: Rolling walker (2 wheeled)   Sit to Stand: Min guard         General transfer comment: cues for UE/LE placement    Balance                                            ADL Overall ADL's : Needs assistance/impaired     Grooming: Wash/dry hands;Wash/dry face;Set up;Sitting   Upper Body Bathing: Minimal assitance;Sitting (to assist getting lines out of way)   Lower Body Bathing: Sit to/from stand;Minimal assistance (pt wearing ted hose (knee high))   Upper Body Dressing : Minimal assistance;Sitting (lines)       Toilet Transfer: Min guard;Stand-pivot (chair to bed)   Toileting- Clothing Manipulation and Hygiene: Min guard;Sit to/from stand         General ADL Comments: completed bath from EOB.  Wife will assist with adls at  home.       Vision     Perception     Praxis      Pertinent Vitals/Pain Pain Assessment: 0-10 Pain Score: 4  Pain Location: L hip Pain Descriptors / Indicators: Sore Pain Intervention(s): Limited activity within patient's tolerance;Monitored during session;Repositioned     Hand Dominance     Extremity/Trunk Assessment Upper Extremity Assessment Upper Extremity Assessment: Overall WFL for tasks assessed           Communication Communication Communication: No difficulties   Cognition Arousal/Alertness: Awake/alert Behavior During Therapy: WFL for tasks assessed/performed Overall Cognitive Status: Within Functional Limits for tasks assessed                     General Comments       Exercises       Shoulder Instructions      Home Living Family/patient expects to be discharged to:: Private residence Living Arrangements: Spouse/significant other Available Help at Discharge: Family Type of Home: House             Bathroom Shower/Tub: Walk-in shower   Bathroom Toilet: Handicapped height     Home Equipment: Environmental consultant - 2 wheels;Shower seat          Prior Functioning/Environment Level of Independence: Independent  OT Diagnosis: Generalized weakness   OT Problem List: Decreased strength;Decreased activity tolerance;Decreased knowledge of use of DME or AE;Pain   OT Treatment/Interventions: Self-care/ADL training;DME and/or AE instruction;Patient/family education    OT Goals(Current goals can be found in the care plan section) Acute Rehab OT Goals Patient Stated Goal: resume previous lifestyle with decreased pain OT Goal Formulation: With patient Time For Goal Achievement: 09/16/14 Potential to Achieve Goals: Good ADL Goals Pt Will Perform Grooming: with supervision;standing Pt Will Transfer to Toilet: with supervision;ambulating (high commode, with use of walker) Pt Will Perform Toileting - Clothing Manipulation and hygiene:  with supervision;sit to/from stand Pt Will Perform Tub/Shower Transfer: Shower transfer;with min guard assist;ambulating;shower seat  OT Frequency: Min 2X/week   Barriers to D/C:            Co-evaluation              End of Session Nurse Communication:  (back with red "pimples")  Activity Tolerance: Patient tolerated treatment well Patient left: in bed;with call bell/phone within reach;with family/visitor present   Time: 1453-1520 OT Time Calculation (min): 27 min Charges:  OT General Charges $OT Visit: 1 Procedure OT Evaluation $Initial OT Evaluation Tier I: 1 Procedure OT Treatments $Self Care/Home Management : 8-22 mins G-Codes:    Bianey Tesoro 10/05/14, 3:37 PM   Lesle Chris, OTR/L [phone] 10-05-14

## 2014-09-09 NOTE — Progress Notes (Signed)
Physical Therapy Treatment Patient Details Name: [name] MRN: [id number] DOB: [birth year] Today's Date: 09/09/2014    History of Present Illness L THR w/ post op angina    PT Comments    POD # 5 am session withheld till after first unit of blood. PM session pt currently receiving 2nd unit of blood.  Assited OOB to amb in hallway when 1/2 way pt needed to turn around to return to the room for a BM.  Assisted in bathroom.Assisted with hygiene then continued to walk in the hallway.  No c/o any chest pains.  No c/o any dizziness.  "feels good to get up and walk".  Spouse [name] (retired Therapist, sports) assisted by following with recliner.  Pt tolerated an increased distance. Returned to room and positioned in chair as lunch tray arrived.    Follow Up Recommendations  Home health PT     Equipment Recommendations  None recommended by PT    Recommendations for Other Services       Precautions / Restrictions Precautions Precautions: Fall Precaution Comments: monitor sats Restrictions Weight Bearing Restrictions: No LLE Weight Bearing: Weight bearing as tolerated    Mobility  Bed Mobility Overal bed mobility: Needs Assistance Bed Mobility: Supine to Sit     Supine to sit: Supervision;Min guard     General bed mobility comments: safety with multiple lines/leads  Transfers Overall transfer level: Needs assistance Equipment used: Rolling walker (2 wheeled)   Sit to Stand: Min guard         General transfer comment: cues for LE management and use of UEs to self assist  Ambulation/Gait Ambulation/Gait assistance: Min guard;+2 safety/equipment Ambulation Distance (Feet): 285 Feet Assistive device: Rolling walker (2 wheeled) Gait Pattern/deviations: Step-to pattern;Step-through pattern;Trunk flexed Gait velocity: functional   General Gait Details: cues for posture, position from RW, and initial sequence.  spoise assisted by following with recliner.   Stairs             Wheelchair Mobility    Modified Rankin (Stroke Patients Only)       Balance                                    Cognition Arousal/Alertness: Awake/alert Behavior During Therapy: WFL for tasks assessed/performed Overall Cognitive Status: Within Functional Limits for tasks assessed                      Exercises      General Comments        Pertinent Vitals/Pain Pain Assessment: 0-10 Pain Score: 4  Pain Location: L hip/groin Pain Descriptors / Indicators: Aching;Sore;Tender Pain Intervention(s): Repositioned;Monitored during session    Home Living                      Prior Function            PT Goals (current goals can now be found in the care plan section) Progress towards PT goals: Progressing toward goals    Frequency  7X/week    PT Plan Current plan remains appropriate    Co-evaluation             End of Session Equipment Utilized During Treatment: Gait belt Activity Tolerance: Patient tolerated treatment well Patient left: in chair;with call bell/phone within reach     Time: 1325-1410 PT Time Calculation (min) (ACUTE ONLY): 45 min  Charges:  $Gait  Training: 8-22 mins $Therapeutic Activity: 23-37 mins                    G Codes:      Rica Koyanagi  PTA [facility]  Acute  Rehab Pager      [phone]

## 2014-09-10 LAB — TYPE AND SCREEN
ABO/RH(D): O POS
Antibody Screen: NEGATIVE
Unit division: 0
Unit division: 0
Unit division: 0
Unit division: 0

## 2014-09-10 LAB — CBC
HCT: 32.1 % — ABNORMAL LOW (ref 39.0–52.0)
HEMOGLOBIN: 10.6 g/dL — AB (ref 13.0–17.0)
MCH: 28.1 pg (ref 26.0–34.0)
MCHC: 33 g/dL (ref 30.0–36.0)
MCV: 85.1 fL (ref 78.0–100.0)
Platelets: 103 10*3/uL — ABNORMAL LOW (ref 150–400)
RBC: 3.77 MIL/uL — ABNORMAL LOW (ref 4.22–5.81)
RDW: 14.9 % (ref 11.5–15.5)
WBC: 11.4 10*3/uL — ABNORMAL HIGH (ref 4.0–10.5)

## 2014-09-10 NOTE — Plan of Care (Signed)
Problem: Phase III Progression Outcomes Goal: Incision clean - minimal/no drainage Outcome: Progressing Incision has a hematoma (per MD); ice bag applied to incisional area.

## 2014-09-10 NOTE — Progress Notes (Signed)
Physical Therapy Treatment Patient Details Name: CELEDONIO SORTINO MRN: [id number] DOB: 1926/11/23 Today's Date: 09/10/2014    History of Present Illness L THR w/ post op angina.  Post-op non-STEMI    PT Comments    POD # 6 not seen am due to room transfer from ICU to 4th floor telemetry.  Seen in pm, assisted OOB to amb around unit twice with RW as spouse followed with recliner just in case.  Avg HR with amb 77.  No c/o chest pains and no nausea.  "Feeling good" stated pt. Positioned in recliner.  Follow Up Recommendations  Home health PT     Equipment Recommendations  None recommended by PT    Recommendations for Other Services       Precautions / Restrictions Precautions Precautions: Fall Restrictions Weight Bearing Restrictions: No LLE Weight Bearing: Weight bearing as tolerated    Mobility  Bed Mobility Overal bed mobility: Modified Independent             General bed mobility comments: increased time  Transfers Overall transfer level: Needs assistance Equipment used: Rolling walker (2 wheeled) Transfers: Sit to/from Stand Sit to Stand: Supervision         General transfer comment: increased time/good use of hands to steady self  Ambulation/Gait Ambulation/Gait assistance: Supervision;Min guard Ambulation Distance (Feet): 700 Feet (around unit twice avg HR 77) Assistive device: Rolling walker (2 wheeled) Gait Pattern/deviations: Step-through pattern Gait velocity: functional   General Gait Details: increased time and and only one VC safety negociating around obsticles   Stairs            Wheelchair Mobility    Modified Rankin (Stroke Patients Only)       Balance                                    Cognition Arousal/Alertness: Awake/alert Behavior During Therapy: WFL for tasks assessed/performed Overall Cognitive Status: Within Functional Limits for tasks assessed                      Exercises      General  Comments        Pertinent Vitals/Pain Pain Assessment: 0-10 Pain Score: 3  Pain Location: L hip Pain Descriptors / Indicators: Sore Pain Intervention(s): Repositioned;Ice applied;Monitored during session    Home Living                      Prior Function            PT Goals (current goals can now be found in the care plan section) Progress towards PT goals: Progressing toward goals    Frequency  7X/week    PT Plan      Co-evaluation             End of Session Equipment Utilized During Treatment: Gait belt Activity Tolerance: Patient tolerated treatment well Patient left: in chair;with call bell/phone within reach     Time: 1440-1510 PT Time Calculation (min) (ACUTE ONLY): 30 min  Charges:  $Gait Training: 8-22 mins $Therapeutic Activity: 8-22 mins                    G Codes:      Rica Koyanagi  PTA [facility]  Acute  Rehab Pager      [phone]

## 2014-09-10 NOTE — Progress Notes (Signed)
   Subjective:  Patient reports pain as mild. Transfused 2U PRBCs yesterday. Heparin gtt stopped. Last chest pain was yesterday am.  Objective:   VITALS:   Filed Vitals:   09/10/14 0300 09/10/14 0400 09/10/14 0500 09/10/14 0700  BP: 129/38  124/39 91/39  Pulse: 80  76 73  Temp:  98.4 F (36.9 C)    TempSrc:  Oral    Resp: 25  18 16   Height:      Weight:      SpO2: 95%  95% 91%    Neurologically intact ABD soft Sensation intact distally Intact pulses distally Dorsiflexion/Plantar flexion intact Incision: dressing C/D/I + Hematoma; thigh compressible   Lab Results  Component Value Date   WBC 11.4* 09/10/2014   HGB 10.6* 09/10/2014   HCT 32.1* 09/10/2014   MCV 85.1 09/10/2014   PLT 103* 09/10/2014   BMET    Component Value Date/Time   NA 138 09/06/2014 0640   K 3.5 09/06/2014 0640   CL 110 09/06/2014 0640   CO2 24 09/06/2014 0640   GLUCOSE 106* 09/06/2014 0640   BUN 13 09/06/2014 0640   CREATININE 1.16 09/06/2014 0640   CALCIUM 7.6* 09/06/2014 0640   GFRNONAA 56* 09/06/2014 0640   GFRAA 64* 09/06/2014 0640     Assessment/Plan: 6 Days Post-Op   Principal Problem:   Osteoarthritis of left hip Active Problems:   CAD (coronary artery disease)   S/P CABG (coronary artery bypass graft)   Thrombocytopenia   Primary osteoarthritis of left hip   Angina pectoris   Elevated troponin   NSTEMI (non-ST elevated myocardial infarction)  WBAT with walker DVT ppx: on lovenox, ASA 81, plavix Up with therapy  L hip hematoma: due to heparin gtt, no active wound drainage, will monitor Dispo: d/c home when clears PT and medically cleared   Zaylee Cornia, Horald Pollen 09/10/2014, 8:00 AM   Rod Can, MD Cell [phone]

## 2014-09-10 NOTE — Progress Notes (Signed)
Subjective:  He has had no angina since he was transfused yesterday.  Was able to participate in physical therapy and walking without angina.  Feels good today and wants to know when he can go home..  Objective:  Vital Signs in the last 24 hours: BP 91/39 mmHg  Pulse 73  Temp(Src) 98.4 F (36.9 C) (Oral)  Resp 16  Ht 5\' 7"  (1.702 m)  Wt 78.472 kg (173 lb)  BMI 27.09 kg/m2  SpO2 91%  Physical Exam: Pleasant elderly male in no acute distress Lungs:  Clear Cardiac:  Regular rhythm, normal S1 and S2, no S3, 2/6 murmur Extremities:  No edema present  Intake/Output from previous day: 03/08 0701 - 03/09 0700 In: 1226.4 [P.O.:480; I.V.:86.4; Blood:660] Out: 1685 [Urine:1685]  Weight Filed Weights   09/04/14 1715  Weight: 78.472 kg (173 lb)    Lab Results:  CBC:  Recent Labs  09/09/14 0415 09/10/14 0401  WBC 8.4 11.4*  HGB 8.6* 10.6*  HCT 26.5* 32.1*  MCV 85.5 85.1  PLT 96* 103*   Cardiac Panel (last 3 results)  Recent Labs  09/08/14 0109 09/08/14 0715 09/08/14 1340  TROPONINI 0.69* 0.78* 0.67*    Telemetry: Currently normal sinus rhythm  Assessment/Plan:  1.  Non-STEMI occurring in the postoperative setting-likely secondary to the stress of surgery and postop anemia.  No angina since transfused yesterday.   2.  Severe coronary artery disease with previous redo bypass grafting in several stenting procedures 3. Postoperative anemia with blood loss -heparin was discontinued yesterday.  He does have a small hematoma and I would not restart heparin.  Hemoglobin is much better today.  Recommendations:  Okay to move to floor.  I reviewed his films yesterday with interventional cardiology and he really doesn't have great targets to go after with percutaneous intervention unless he had refractory angina.  With this in mind I would recommend that we move him to the floor and continue therapy.  I would not heparinize him because of the hematoma.  Could be discharged on  aspirin and Plavix at home.  He also will need to take iron at home.  Discussed with his orthopedic surgeon.  Kerry Hough  MD [facility]  09/10/2014, 8:38 AM

## 2014-09-10 NOTE — Progress Notes (Signed)
Patient received to floor as transfer from ICU.  Patient stable, comfortable, in no pain at time of transfer.  Telemetry placed and confirmed with CMT. Wife at bedside.  Will continue to monitor patient.

## 2014-09-11 MED ORDER — ONDANSETRON HCL 4 MG PO TABS: 4.0000 mg | ORAL_TABLET | Freq: Four times a day (QID) | ORAL | Status: DC | PRN

## 2014-09-11 MED ORDER — ASPIRIN EC 325 MG PO TBEC: 325.0000 mg | DELAYED_RELEASE_TABLET | Freq: Two times a day (BID) | ORAL | Status: DC

## 2014-09-11 MED ORDER — METHOCARBAMOL 500 MG PO TABS: 500.0000 mg | ORAL_TABLET | Freq: Four times a day (QID) | ORAL | Status: DC | PRN

## 2014-09-11 MED ORDER — SENNA 8.6 MG PO TABS: 2.0000 | ORAL_TABLET | Freq: Every day | ORAL | Status: DC

## 2014-09-11 MED ORDER — HYDROCODONE-ACETAMINOPHEN 5-325 MG PO TABS: 1.0000 | ORAL_TABLET | ORAL | Status: DC | PRN

## 2014-09-11 NOTE — Progress Notes (Signed)
Physical Therapy Treatment Patient Details Name: [name] MRN: [id number] DOB: [birth year] Today's Date: 09/11/2014    History of Present Illness L THR w/ post op angina.  Post-op non-STEMI    PT Comments    POD # 7 pt feeling ready to go home.  Assisted amb a great distance in hallway.  Practiced stairs then assist with shower transfer.  Education on Programme researcher, broadcasting/film/video.  Family had many questions.    Follow Up Recommendations  Home health PT     Equipment Recommendations       Recommendations for Other Services       Precautions / Restrictions Precautions Precautions: Fall Precaution Comments: monitor sats Restrictions Weight Bearing Restrictions: No LLE Weight Bearing: Weight bearing as tolerated    Mobility  Bed Mobility Overal bed mobility: Modified Independent                Transfers Overall transfer level: Needs assistance Equipment used: Rolling walker (2 wheeled) Transfers: Sit to/from Stand Sit to Stand: Supervision         General transfer comment: cues for hand placement  Ambulation/Gait Ambulation/Gait assistance: Supervision Ambulation Distance (Feet): 350 Feet Assistive device: Rolling walker (2 wheeled) Gait Pattern/deviations: Step-through pattern Gait velocity: functional       Stairs Stairs: Yes Stairs assistance: Min guard Stair Management: One rail Left;Forwards Number of Stairs: 2 General stair comments: one VC on safety and sequencing  Wheelchair Mobility    Modified Rankin (Stroke Patients Only)       Balance                                    Cognition Arousal/Alertness: Awake/alert Behavior During Therapy: WFL for tasks assessed/performed Overall Cognitive Status: Within Functional Limits for tasks assessed                      Exercises      General Comments        Pertinent Vitals/Pain Pain Assessment: 0-10 Pain Score: 2  Pain Location: L hip Pain Descriptors /  Indicators: Sore Pain Intervention(s): Repositioned    Home Living                      Prior Function            PT Goals (current goals can now be found in the care plan section) Progress towards PT goals: Progressing toward goals    Frequency  7X/week    PT Plan      Co-evaluation             End of Session Equipment Utilized During Treatment: Gait belt Activity Tolerance: Patient tolerated treatment well Patient left: in chair;with call bell/phone within reach     Time: 1005-1044 PT Time Calculation (min) (ACUTE ONLY): 39 min  Charges:  $Gait Training: 8-22 mins $Therapeutic Activity: 8-22 mins $Self Care/Home Management: 8-22                    G Codes:      Rica Koyanagi  PTA [facility]  Acute  Rehab Pager      [phone]

## 2014-09-11 NOTE — Progress Notes (Signed)
   Subjective:  Patient reports pain as mild. No chest pain. Wants to go home.  Objective:   VITALS:   Filed Vitals:   09/10/14 1203 09/10/14 1434 09/10/14 2050 09/11/14 0417  BP: 104/51 101/56 115/60 113/70  Pulse: 63 69 79 76  Temp: 98.2 F (36.8 C) 98 F (36.7 C) 97.9 F (36.6 C) 98.8 F (37.1 C)  TempSrc: Oral Oral Oral Oral  Resp: 20 20 22 18   Height:      Weight:      SpO2: 96% 98% 96% 95%    Neurologically intact ABD soft Sensation intact distally Intact pulses distally Dorsiflexion/Plantar flexion intact Incision: dressing C/D/I + Hematoma - seems improved from yesterday; thigh compressible   Lab Results  Component Value Date   WBC 11.4* 09/10/2014   HGB 10.6* 09/10/2014   HCT 32.1* 09/10/2014   MCV 85.1 09/10/2014   PLT 103* 09/10/2014   BMET    Component Value Date/Time   NA 138 09/06/2014 0640   K 3.5 09/06/2014 0640   CL 110 09/06/2014 0640   CO2 24 09/06/2014 0640   GLUCOSE 106* 09/06/2014 0640   BUN 13 09/06/2014 0640   CREATININE 1.16 09/06/2014 0640   CALCIUM 7.6* 09/06/2014 0640   GFRNONAA 56* 09/06/2014 0640   GFRAA 64* 09/06/2014 0640     Assessment/Plan: 7 Days Post-Op   Principal Problem:   Osteoarthritis of left hip Active Problems:   CAD (coronary artery disease)   S/P CABG (coronary artery bypass graft)   Thrombocytopenia   Primary osteoarthritis of left hip   Angina pectoris   Elevated troponin   NSTEMI (non-ST elevated myocardial infarction)  WBAT with walker DVT ppx: on lovenox, ASA 81, plavix L hip hematoma: improved, due to heparin gtt, no active wound drainage, will monitor Dispo: d/c home today per cardiology   Karelyn Brisby, Horald Pollen 09/11/2014, 7:12 AM   Rod Can, MD Cell [phone]

## 2014-09-11 NOTE — Discharge Summary (Signed)
Physician Discharge Summary  Patient ID: [name] MRN: [id number] DOB/AGE: 79-Feb-1928 79 y.o.  Admit date: 09/04/2014 Discharge date: 09/11/2014  Admission Diagnoses:  Osteoarthritis of left hip  Discharge Diagnoses:  Principal Problem:   Osteoarthritis of left hip Active Problems:   CAD (coronary artery disease)   S/P CABG (coronary artery bypass graft)   Thrombocytopenia   Primary osteoarthritis of left hip   Angina pectoris   Elevated troponin   NSTEMI (non-ST elevated myocardial infarction)   Past Medical History  Diagnosis Date  . H/O prostate cancer     Prior cryoablation   . Hyperlipidemia 06/26/2012  . S/P CABG (coronary artery bypass graft)   . Villous adenoma of rectum   . Anxiety   . CAD (coronary artery disease) 06/26/2012    PTCA RCA 1995 CABG w. LIMA to LAD, SVG to dx 1, PD and PL March 2000 Dr. Servando Snare for 3VD Cypher stent SVG to RCA  2005 redo CABG 07/2005 with SVG to OM and SVG to PD-PL   DR. TILLEY IS PT'S CARDIOLOGIST  . Memory difficulties   . Hearing loss     bilateral  . Arthritis     Surgeries: Procedure(s): LEFT TOTAL HIP ARTHROPLASTY ANTERIOR APPROACH on 09/04/2014   Consultants (if any): Treatment Team:  Jacolyn Reedy, MD  Discharged Condition: Improved  Hospital Course: [name] is an 81 y.o. male who was admitted 09/04/2014 with a diagnosis of Osteoarthritis of left hip and went to the operating room on 09/04/2014 and underwent the above named procedures.    He was given perioperative antibiotics:  Anti-infectives    Start     Dose/Rate Route Frequency Ordered Stop   09/04/14 2000  ceFAZolin (ANCEF) IVPB 1 g/50 mL premix     1 g 100 mL/hr over 30 Minutes Intravenous Every 6 hours 09/04/14 1720 09/05/14 0130   09/04/14 1046  ceFAZolin (ANCEF) IVPB 2 g/50 mL premix     2 g 100 mL/hr over 30 Minutes Intravenous On call to O.R. 09/04/14 1046 09/04/14 1250    .  He was given sequential compression devices, early ambulation,  plavix, and ASA for DVT prophylaxis.  During the night of POD#0, he developed angina that responded to his normal dose of SL nitro. EKG showed no acute changes. Troponins were mildly elevated and then, upon cycling, significantly raised. Cardiology was consulted, and he was moved to the ICU, where he received nitro gtt and heparin gtt. Cardiology recommended PRBCs. After a couple days, his Tn began decreasing. PT/OT was started, and his drips were d/c'd. He developed a small L hip hematoma without drainage. He was switched to lovenox by cardiology with the plan to d/c home on ASA. He was ultimately declared medically stable for d/c home by cardiology.   Recent vital signs:  Filed Vitals:   09/11/14 0417  BP: 113/70  Pulse: 76  Temp: 98.8 F (37.1 C)  Resp: 18    Recent laboratory studies:  Lab Results  Component Value Date   HGB 10.6* 09/10/2014   HGB 8.6* 09/09/2014   HGB 10.8* 09/08/2014   Lab Results  Component Value Date   WBC 11.4* 09/10/2014   PLT 103* 09/10/2014   Lab Results  Component Value Date   INR 1.39 09/04/2014   Lab Results  Component Value Date   NA 138 09/06/2014   K 3.5 09/06/2014   CL 110 09/06/2014   CO2 24 09/06/2014   BUN 13 09/06/2014   CREATININE  1.16 09/06/2014   GLUCOSE 106* 09/06/2014    Discharge Medications:     Medication List    STOP taking these medications        ibuprofen 200 MG tablet  Commonly known as:  ADVIL,MOTRIN     Ibuprofen-Diphenhydramine Cit 200-38 MG Tabs      TAKE these medications        amLODipine 5 MG tablet  Commonly known as:  NORVASC  Take 5 mg by mouth every morning.     aspirin EC 325 MG tablet  Take 1 tablet (325 mg total) by mouth 2 (two) times daily after a meal.     bimatoprost 0.03 % ophthalmic solution  Commonly known as:  LUMIGAN  Place 1 drop into both eyes at bedtime.     cholecalciferol 1000 UNITS tablet  Commonly known as:  VITAMIN D  Take 1,000 Units by mouth every morning.      clopidogrel 75 MG tablet  Commonly known as:  PLAVIX  Take 75 mg by mouth every morning.     docusate sodium 100 MG capsule  Commonly known as:  COLACE  Take 1 capsule (100 mg total) by mouth 2 (two) times daily as needed for mild constipation.     finasteride 5 MG tablet  Commonly known as:  PROSCAR  Take 5 mg by mouth every morning.     GARLIC PO  Take 1 tablet by mouth every morning.     HYDROcodone-acetaminophen 5-325 MG per tablet  Commonly known as:  NORCO/VICODIN  Take 1-2 tablets by mouth every 4 (four) hours as needed (breakthrough pain).     isosorbide mononitrate 120 MG 24 hr tablet  Commonly known as:  IMDUR  Take 120 mg by mouth daily with lunch.     ISTALOL 0.5 % (DAILY) Soln  Generic drug:  Timolol Maleate  Place 1 drop into both eyes every morning.     Memantine HCl ER 21 MG Cp24  Take 1 capsule by mouth daily.     methocarbamol 500 MG tablet  Commonly known as:  ROBAXIN  Take 1 tablet (500 mg total) by mouth every 6 (six) hours as needed for muscle spasms.     metoprolol 50 MG tablet  Commonly known as:  LOPRESSOR  Take 75 mg by mouth 2 (two) times daily.     nitroGLYCERIN 0.4 MG SL tablet  Commonly known as:  NITROSTAT  Place 0.4 mg under the tongue every 5 (five) minutes as needed for chest pain.     ondansetron 4 MG tablet  Commonly known as:  ZOFRAN  Take 1 tablet (4 mg total) by mouth every 6 (six) hours as needed for nausea.     OVER THE COUNTER MEDICATION  Take 1 capsule by mouth every morning. Mega Red Omega-3 Krill Oil     senna 8.6 MG Tabs tablet  Commonly known as:  SENOKOT  Take 2 tablets (17.2 mg total) by mouth at bedtime.        Diagnostic Studies: Dg C-arm 1-60 Min-no Report  09/04/2014   CLINICAL DATA: surg   C-ARM 1-60 MINUTES  Fluoroscopy was utilized by the requesting physician.  No radiographic  interpretation.    Dg Hip Unilat With Pelvis 1v Left  09/04/2014   CLINICAL DATA:  Left hip replacement  EXAM: DG C-ARM 1-60 MIN  - NRPT [facility]; LEFT HIP (WITH PELVIS) 1 VIEW  COMPARISON:  None.  FLUOROSCOPY TIME:  If the device does not provide the exposure  index:  Fluoroscopy Time:  1 minutes 3 second  Number of Acquired Images:  2  FINDINGS: Two intraoperative fluoroscopic spot images are provided. Left total hip arthroplasty. No obvious dislocation. Normal alignment.  IMPRESSION: Left hip arthroplasty.   Electronically Signed   By: Kathreen Devoid   On: 09/04/2014 15:17   Dg Hip Port Unilat With Pelvis 1v Left  09/04/2014   CLINICAL DATA:  81 year old male status post left total hip arthroplasty.  EXAM: LEFT HIP (WITH PELVIS) 1 VIEW PORTABLE  COMPARISON:  09/04/2014 at 2:50 p.m.  FINDINGS: Postoperative changes of left total hip arthroplasty are noted. The femoral and acetabular components of the prosthesis appear properly seated without definite periprosthetic fracture or other acute complicating features. The prosthetic femoral head is properly located within the prosthetic acetabular cup. Visualized portions of the bony pelvis and right proximal femur are intact. Moderate degenerative changes of osteoarthritis are noted in the right hip joint.  IMPRESSION: 1. Status post left total hip arthroplasty without complicating features, as above.   Electronically Signed   By: Vinnie Langton M.D.   On: 09/04/2014 16:25    Disposition: 01-Home or Self Care      Discharge Instructions    Call MD / Call 911    Complete by:  As directed   If you experience chest pain or shortness of breath, CALL 911 and be transported to the hospital emergency room.  If you develope a fever above 101 F, pus (white drainage) or increased drainage or redness at the wound, or calf pain, call your surgeon's office.     Constipation Prevention    Complete by:  As directed   Drink plenty of fluids.  Prune juice may be helpful.  You may use a stool softener, such as Colace (over the counter) 100 mg twice a day.  Use MiraLax (over the counter) for constipation as  needed.     Diet - low sodium heart healthy    Complete by:  As directed      Driving restrictions    Complete by:  As directed   No driving for 6 weeks     Follow the hip precautions as taught in Physical Therapy    Complete by:  As directed      Increase activity slowly as tolerated    Complete by:  As directed      Lifting restrictions    Complete by:  As directed   No lifting for 6 weeks     TED hose    Complete by:  As directed   Use stockings (TED hose) for 3 weeks on both leg(s).  You may remove them at night for sleeping.           Follow-up Information    Follow up with Laureano Hetzer, Horald Pollen, MD. Schedule an appointment as soon as possible for a visit in 11 days.   Specialty:  Orthopedic Surgery   Why:  For wound re-check   Contact information:   [address] [place] [address] [phone]        Signed: Elie Goody 09/11/2014, 7:20 AM

## 2014-09-11 NOTE — Progress Notes (Signed)
Occupational Therapy Treatment Patient Details Name: [name] MRN: [id number] DOB: [birth year] Today's Date: 09/11/2014    History of present illness L THR w/ post op angina.  Post-op non-STEMI   OT comments  Pt moving well:  Cues given for safety for sit to stand and for turns.  Reviewed safety and shower sequence with wife when she arrived.  Follow Up Recommendations  Supervision/Assistance - 24 hour    Equipment Recommendations  None recommended by OT    Recommendations for Other Services      Precautions / Restrictions Precautions Precautions: Fall Restrictions LLE Weight Bearing: Weight bearing as tolerated       Mobility Bed Mobility                  Transfers   Equipment used: Rolling walker (2 wheeled) Transfers: Sit to/from Stand Sit to Stand: Supervision         General transfer comment: cues for hand placement    Balance                                   ADL Overall ADL's : Needs assistance/impaired     Grooming: Oral care;Supervision/safety;Standing                   Toilet Transfer: Min guard;Ambulation;Comfort height toilet;RW       Tub/ Shower Transfer: Walk-in shower;Min guard;Ambulation;3 in 1     General ADL Comments: Pt cued for safety as he tends to turn quickly. Wife arrived as OT was finishing up.  Recommended she cue him. Reviewed sequence for shower transfer:  pt was unable to verbalize this.       Vision                     Perception     Praxis      Cognition   Behavior During Therapy: WFL for tasks assessed/performed Overall Cognitive Status: Within Functional Limits for tasks assessed                       Extremity/Trunk Assessment               Exercises     Shoulder Instructions       General Comments      Pertinent Vitals/ Pain       Pain Score: 2  Pain Location: L hip Pain Descriptors / Indicators: Sore Pain Intervention(s):  Repositioned;Limited activity within patient's tolerance;Monitored during session  Home Living                                          Prior Functioning/Environment              Frequency Min 2X/week     Progress Toward Goals  OT Goals(current goals can now be found in the care plan section)  Progress towards OT goals: Progressing toward goals  Acute Rehab OT Goals Time For Goal Achievement: 09/16/14  Plan Discharge plan remains appropriate    Co-evaluation                 End of Session     Activity Tolerance Patient tolerated treatment well   Patient Left in chair;with call bell/phone within reach;with family/visitor present   Nurse Communication  Time: 8022-3361 OT Time Calculation (min): 11 min  Charges: OT General Charges $OT Visit: 1 Procedure OT Treatments $Self Care/Home Management : 8-22 mins  Lisaann Atha 09/11/2014, 11:58 AM   Lesle Chris, OTR/L [phone] 09/11/2014

## 2014-09-11 NOTE — Care Management Note (Signed)
CARE MANAGEMENT NOTE 09/11/2014  Patient:  [name], [name]   Account Number:  [id number]  Date Initiated:  09/05/2014  Documentation initiated by:  [facility]  Subjective/Objective Assessment:   adm: Left total hip arthroplasty, anterior approach.     Action/Plan:   discharge planning   Anticipated DC Date:  09/11/2014   Anticipated DC Plan:  [place]  CM consult      Choice offered to / List presented to:  C-1 Patient   DME arranged  [place]      DME agency  [place] arranged  HH-1 RN  [place] agency  [facility]   Status of service:  In process, will continue to follow Medicare Important Message given?  YES (If response is "NO", the following Medicare IM given date fields will be blank) Date Medicare IM given:  09/11/2014 Medicare IM given by:  Marney Doctor Date Additional Medicare IM given:   Additional Medicare IM given by:    Discharge Disposition:    Per UR Regulation:  Reviewed for med. necessity/level of care/duration of stay  If discussed at [place] of Stay Meetings, dates discussed:    Comments:  09/11/14 Marney Doctor RN,BSN,NCM [phone] Spoke with pt and wife in room about DC needs. Pt to Dc home today.  Pt and wife state that they have all equipment that they need at home already.  HH orders in and [place] has been following. No additional CM needs identified.  09/08/14 Dessa Phi RN BSN NCM [place] following. hhc orders already placed. dme home orders placed-[facility] dme rep Lecretia aware.  09/07/2014 1800 HH arranged with [facility]. Waiting final recommendations for home. Jonnie Finner RN CCM Case Mgmt phone [phone]   09/05/14 08:00 CM met with pt in room to confirm [facility] as home health choice for HHPT.  Pt very confused.  RN aware. CM will follow at a later time to discuss disposition. Mariane Masters, BSN, CM  [phone].

## 2014-09-11 NOTE — Progress Notes (Signed)
Subjective:  No angina.  Able to do PT.  From a cardiac viewpoint is OK for discharge.    Objective:  Vital Signs in the last 24 hours: BP 113/70 mmHg  Pulse 76  Temp(Src) 98.8 F (37.1 C) (Oral)  Resp 18  Ht 5\' 7"  (1.702 m)  Wt 78.472 kg (173 lb)  BMI 27.09 kg/m2  SpO2 95%  Physical Exam: Pleasant elderly male in no acute distress Lungs:  Clear Cardiac:  Regular rhythm, normal S1 and S2, no S3, 2/6 murmur Extremities:  No edema present  Intake/Output from previous day: 03/09 0701 - 03/10 0700 In: 360 [P.O.:360] Out: 650 [Urine:650]  Weight Filed Weights   09/04/14 1715  Weight: 78.472 kg (173 lb)    Lab Results:  CBC:  Recent Labs  09/09/14 0415 09/10/14 0401  WBC 8.4 11.4*  HGB 8.6* 10.6*  HCT 26.5* 32.1*  MCV 85.5 85.1  PLT 96* 103*   Cardiac Panel (last 3 results)  Recent Labs  09/08/14 1340  TROPONINI 0.67*    Telemetry: Normal sinus rhythm  Assessment/Plan:  1.  Non-STEMI occurring in the postoperative setting-likely secondary to the stress of surgery and postop anemia.  No angina currently  2.  Severe coronary artery disease with previous redo bypass grafting with several stenting procedures-a number of an revascularized areas not good for intervention 3. Postoperative anemia with blood loss -this appears to be currently resolved.    Recommendations:  He may go home today.  I would have him resume his home medications on discharge.  I would need to see him back in the next few weeks once he is able to get out of the house.  Kerry Hough  MD [facility] Card[facility]  09/11/2014, 8:49 AM

## 2014-09-11 NOTE — Discharge Instructions (Signed)
Dr. Rod Can Joint Replacement Specialist [facility] [address] [place], [place] [address] [phone]   TOTAL HIP REPLACEMENT POSTOPERATIVE DIRECTIONS    Hip Rehabilitation, Guidelines Following Surgery  The results of a hip operation are greatly improved after range of motion and muscle strengthening exercises. Follow all safety measures which are given to protect your hip. If any of these exercises cause increased pain or swelling in your joint, decrease the amount until you are comfortable again. Then slowly increase the exercises. Call your caregiver if you have problems or questions.  HOME CARE INSTRUCTIONS  Most of the following instructions are designed to prevent the dislocation of your new hip.  Remove items at home which could result in a fall. This includes throw rugs or furniture in walking pathways.  Continue medications as instructed at time of discharge.  You may have some home medications which will be placed on hold until you complete the course of blood thinner medication.  You may start showering once you are discharged home. Do not remove your dressing. Do not put on socks or shoes without following the instructions of your caregivers.   Sit on chairs with arms. Use the chair arms to help push yourself up when arising.  Arrange for the use of a toilet seat elevator so you are not sitting low.   Walk with walker as instructed.  You may resume a sexual relationship in one month or when given the OK by your caregiver.  Use walker as long as suggested by your caregivers.  You may put full weight on your legs and walk as much as is comfortable. Avoid periods of inactivity such as sitting longer than an hour when not asleep. This helps prevent blood clots.  You may return to work once you are cleared by Engineer, production.  Do not drive a car for 6 weeks or until released by your surgeon.  Do not drive while taking narcotics.  Wear  elastic stockings for two weeks following surgery during the day but you may remove then at night.  Make sure you keep all of your appointments after your operation with all of your doctors and caregivers. You should call the office at the above phone number and make an appointment for approximately two weeks after the date of your surgery. Please pick up a stool softener and laxative for home use as long as you are requiring pain medications.  ICE to the affected hip every three hours for 30 minutes at a time and then as needed for pain and swelling. Continue to use ice on the hip for pain and swelling from surgery. You may notice swelling that will progress down to the foot and ankle.  This is normal after surgery.  Elevate the leg when you are not up walking on it.   It is important for you to complete the blood thinner medication as prescribed by your doctor.  Continue to use the breathing machine which will help keep your temperature down.  It is common for your temperature to cycle up and down following surgery, especially at night when you are not up moving around and exerting yourself.  The breathing machine keeps your lungs expanded and your temperature down.  RANGE OF MOTION AND STRENGTHENING EXERCISES  These exercises are designed to help you keep full movement of your hip joint. Follow your caregiver's or physical therapist's instructions. Perform all exercises about fifteen times, three times per day or as directed. Exercise both  hips, even if you have had only one joint replacement. These exercises can be done on a training (exercise) mat, on the floor, on a table or on a bed. Use whatever works the best and is most comfortable for you. Use music or television while you are exercising so that the exercises are a pleasant break in your day. This will make your life better with the exercises acting as a break in routine you can look forward to.  Lying on your back, slowly slide your foot  toward your buttocks, raising your knee up off the floor. Then slowly slide your foot back down until your leg is straight again.  Lying on your back spread your legs as far apart as you can without causing discomfort.  Lying on your side, raise your upper leg and foot straight up from the floor as far as is comfortable. Slowly lower the leg and repeat.  Lying on your back, tighten up the muscle in the front of your thigh (quadriceps muscles). You can do this by keeping your leg straight and trying to raise your heel off the floor. This helps strengthen the largest muscle supporting your knee.  Lying on your back, tighten up the muscles of your buttocks both with the legs straight and with the knee bent at a comfortable angle while keeping your heel on the floor.   SKILLED REHAB INSTRUCTIONS: If the patient is transferred to a skilled rehab facility following release from the hospital, a list of the current medications will be sent to the facility for the patient to continue.  When discharged from the skilled rehab facility, please have the facility set up the patient's [place] prior to being released. Also, the skilled facility will be responsible for providing the patient with their medications at time of release from the facility to include their pain medication and their blood thinner medication. If the patient is still at the rehab facility at time of the two week follow up appointment, the skilled rehab facility will also need to assist the patient in arranging follow up appointment in our office and any transportation needs.  MAKE SURE YOU:  Understand these instructions.  Will watch your condition.  Will get help right away if you are not doing well or get worse.  Pick up stool softner and laxative for home use following surgery while on pain medications. Do not remove your dressing. The dressing is waterproof--it is OK to take showers. No soaking or submerging. Continue  to use ice for pain and swelling after surgery. Do not use any lotions or creams on the incision until instructed by your surgeon. Total Hip Protocol.

## 2014-10-09 ENCOUNTER — Ambulatory Visit: Payer: Self-pay | Admitting: Orthopedic Surgery

## 2014-10-24 ENCOUNTER — Encounter (HOSPITAL_COMMUNITY): Admission: RE | Payer: Self-pay | Source: Ambulatory Visit

## 2014-10-24 ENCOUNTER — Inpatient Hospital Stay (HOSPITAL_COMMUNITY): Admission: RE | Admit: 2014-10-24 | Payer: Self-pay | Source: Ambulatory Visit | Admitting: Orthopedic Surgery

## 2014-10-24 SURGERY — ARTHROPLASTY, HIP, TOTAL, ANTERIOR APPROACH
Anesthesia: Choice | Laterality: Left

## 2015-02-24 ENCOUNTER — Encounter (HOSPITAL_COMMUNITY): Payer: Self-pay | Admitting: Vascular Surgery

## 2015-02-24 ENCOUNTER — Observation Stay (HOSPITAL_COMMUNITY)
Admission: EM | Admit: 2015-02-24 | Discharge: 2015-02-26 | Disposition: A | Discharge status: Home / Self Care | Payer: Medicare Other | Attending: Internal Medicine | Admitting: Internal Medicine

## 2015-02-24 ENCOUNTER — Emergency Department (HOSPITAL_COMMUNITY): Payer: Medicare Other

## 2015-02-24 DIAGNOSIS — Z7902 Long term (current) use of antithrombotics/antiplatelets: Secondary | ICD-10-CM | POA: Insufficient documentation

## 2015-02-24 DIAGNOSIS — R079 Chest pain, unspecified: Secondary | ICD-10-CM | POA: Diagnosis not present

## 2015-02-24 DIAGNOSIS — Z8546 Personal history of malignant neoplasm of prostate: Secondary | ICD-10-CM

## 2015-02-24 DIAGNOSIS — Z7982 Long term (current) use of aspirin: Secondary | ICD-10-CM | POA: Insufficient documentation

## 2015-02-24 DIAGNOSIS — E785 Hyperlipidemia, unspecified: Secondary | ICD-10-CM | POA: Diagnosis not present

## 2015-02-24 DIAGNOSIS — I2510 Atherosclerotic heart disease of native coronary artery without angina pectoris: Secondary | ICD-10-CM | POA: Diagnosis not present

## 2015-02-24 DIAGNOSIS — H9190 Unspecified hearing loss, unspecified ear: Secondary | ICD-10-CM | POA: Diagnosis not present

## 2015-02-24 DIAGNOSIS — I10 Essential (primary) hypertension: Secondary | ICD-10-CM

## 2015-02-24 DIAGNOSIS — I209 Angina pectoris, unspecified: Secondary | ICD-10-CM | POA: Diagnosis present

## 2015-02-24 DIAGNOSIS — D375 Neoplasm of uncertain behavior of rectum: Secondary | ICD-10-CM | POA: Insufficient documentation

## 2015-02-24 DIAGNOSIS — D696 Thrombocytopenia, unspecified: Secondary | ICD-10-CM | POA: Diagnosis present

## 2015-02-24 DIAGNOSIS — M48061 Spinal stenosis, lumbar region without neurogenic claudication: Secondary | ICD-10-CM | POA: Diagnosis present

## 2015-02-24 DIAGNOSIS — Z87891 Personal history of nicotine dependence: Secondary | ICD-10-CM | POA: Insufficient documentation

## 2015-02-24 DIAGNOSIS — M1612 Unilateral primary osteoarthritis, left hip: Secondary | ICD-10-CM | POA: Diagnosis present

## 2015-02-24 DIAGNOSIS — Z951 Presence of aortocoronary bypass graft: Secondary | ICD-10-CM | POA: Diagnosis not present

## 2015-02-24 DIAGNOSIS — Z79899 Other long term (current) drug therapy: Secondary | ICD-10-CM | POA: Diagnosis not present

## 2015-02-24 DIAGNOSIS — M4806 Spinal stenosis, lumbar region: Secondary | ICD-10-CM | POA: Diagnosis not present

## 2015-02-24 HISTORY — DX: Angina pectoris, unspecified: I20.9

## 2015-02-24 LAB — I-STAT CHEM 8, ED
BUN: 33 mg/dL — AB (ref 6–20)
CALCIUM ION: 1.02 mmol/L — AB (ref 1.13–1.30)
CREATININE: 1.2 mg/dL (ref 0.61–1.24)
Chloride: 109 mmol/L (ref 101–111)
Glucose, Bld: 189 mg/dL — ABNORMAL HIGH (ref 65–99)
HCT: 39 % (ref 39.0–52.0)
Hemoglobin: 13.3 g/dL (ref 13.0–17.0)
Potassium: 6.8 mmol/L (ref 3.5–5.1)
Sodium: 140 mmol/L (ref 135–145)
TCO2: 23 mmol/L (ref 0–100)

## 2015-02-24 LAB — I-STAT TROPONIN, ED: TROPONIN I, POC: 0 ng/mL (ref 0.00–0.08)

## 2015-02-24 LAB — CBC
HCT: 38.4 % — ABNORMAL LOW (ref 39.0–52.0)
Hemoglobin: 12.7 g/dL — ABNORMAL LOW (ref 13.0–17.0)
MCH: 28.2 pg (ref 26.0–34.0)
MCHC: 33.1 g/dL (ref 30.0–36.0)
MCV: 85.1 fL (ref 78.0–100.0)
PLATELETS: 134 10*3/uL — AB (ref 150–400)
RBC: 4.51 MIL/uL (ref 4.22–5.81)
RDW: 15.2 % (ref 11.5–15.5)
WBC: 8.6 10*3/uL (ref 4.0–10.5)

## 2015-02-24 MED ORDER — NITROGLYCERIN 0.4 MG SL SUBL
0.4000 mg | SUBLINGUAL_TABLET | SUBLINGUAL | Status: AC | PRN
  Administered 2015-02-24 (×3): 0.4 mg via SUBLINGUAL
  Filled 2015-02-24: qty 1

## 2015-02-24 NOTE — ED Notes (Signed)
Spoke with mini lab - will run [employer]

## 2015-02-24 NOTE — ED Provider Notes (Signed)
CSN: [id number]     Arrival date & time 02/24/15  2107 History   First MD Initiated Contact with Patient 02/24/15 2124     Chief Complaint  Patient presents with  . Chest Pain     HPI Pt was seen at 2125. Per EMS and pt report, c/o gradual onset and gradual improvement of persistent chest "pain" that began approximately 2000 PTA. Pt states he was sitting watching TV when he developed "my heart pain." Pt describes this as pain between his shoulder blades which radiates into his shoulders then into his chest. Pt states "my heart pain has been in my shoulders like this since my heart surgery." States he has not been taking his ASA/plavix for the past 5 days due to receiving "injections" in his back to tx his chronic back pain. Denies palpitations, no SOB/cough, no abd pain, no N/V/D, no fevers.    Past Medical History  Diagnosis Date  . H/O prostate cancer     Prior cryoablation   . Hyperlipidemia 06/26/2012  . S/P CABG (coronary artery bypass graft)   . Villous adenoma of rectum   . Anxiety   . CAD (coronary artery disease) 06/26/2012    PTCA RCA 1995 CABG w. LIMA to LAD, SVG to dx 1, PD and PL March 2000 Dr. Servando Snare for 3VD Cypher stent SVG to RCA  2005 redo CABG 07/2005 with SVG to OM and SVG to PD-PL   DR. TILLEY IS PT'S CARDIOLOGIST  . Memory difficulties   . Hearing loss     bilateral  . Arthritis   . Angina pectoris    Past Surgical History  Procedure Laterality Date  . Coronary artery bypass graft  2000, 2007    LIMA and SVGs to Diag 1 PDA PLR March 2000,  SVG to OM and SVG to PDA-PL 07/2005  . Partial colectomy  2007  . Prostate cryoablation    . Hernia repair    . Nasal septum surgery    . Lumbar laminectomy/decompression microdiscectomy N/A 02/19/2014    Procedure: MICRO LUMBAR DECOMPRESSION L4-5/L3-4;  Surgeon: Johnn Hai, MD;  Location: [facility] ORS;  Service: Orthopedics;  Laterality: N/A;  . Left heart catheterization with coronary/graft angiogram N/A 06/29/2012   Procedure: LEFT HEART CATHETERIZATION WITH Beatrix Fetters;  Surgeon: Sinclair Grooms, MD;  Location: [facility] C[facility] LAB;  Service: Cardiovascular;  Laterality: N/A;  . Percutaneous coronary stent intervention (pci-s) N/A 07/03/2012    Procedure: PERCUTANEOUS CORONARY STENT INTERVENTION (PCI-S);  Surgeon: Sinclair Grooms, MD;  Location: [facility] CATH LAB;  Service: Cardiovascular;  Laterality: N/A;  . Left heart catheterization with coronary/graft angiogram N/A 02/28/2013    Procedure: LEFT HEART CATHETERIZATION WITH Beatrix Fetters;  Surgeon: Jacolyn Reedy, MD;  Location: [facility] CATH LAB;  Service: Cardiovascular;  Laterality: N/A;  . Cataract Bilateral     08-13-14  . Total hip arthroplasty Left 09/04/2014    Procedure: LEFT TOTAL HIP ARTHROPLASTY ANTERIOR APPROACH;  Surgeon: Elie Goody, MD;  Location: [facility] ORS;  Service: Orthopedics;  Laterality: Left;  . Back surgery    . Hip surgery     Family History  Problem Relation Age of Onset  . Prostate cancer Father   . Lung cancer Mother   . CVA Brother   . Brain cancer Daughter    Social History  Substance Use Topics  . Smoking status: Former Research scientist (life sciences)  . Smokeless tobacco: Never Used     Comment: quit in 1989  . Alcohol  Use: 0.6 oz/week    1 Standard drinks or equivalent per week     Comment: rare    Review of Systems ROS: Statement: All systems negative except as marked or noted in the HPI; Constitutional: Negative for fever and chills. ; ; Eyes: Negative for eye pain, redness and discharge. ; ; ENMT: Negative for ear pain, hoarseness, nasal congestion, sinus pressure and sore throat. ; ; Cardiovascular: +CP. Negative for palpitations, diaphoresis, dyspnea and peripheral edema. ; ; Respiratory: Negative for cough, wheezing and stridor. ; ; Gastrointestinal: Negative for nausea, vomiting, diarrhea, abdominal pain, blood in stool, hematemesis, jaundice and rectal bleeding. . ; ; Genitourinary: Negative for dysuria, flank pain and  hematuria. ; ; Musculoskeletal: Negative for neck pain. Negative for swelling and trauma.; ; Skin: Negative for pruritus, rash, abrasions, blisters, bruising and skin lesion.; ; Neuro: Negative for headache, lightheadedness and neck stiffness. Negative for weakness, altered level of consciousness , altered mental status, extremity weakness, paresthesias, involuntary movement, seizure and syncope.      Allergies  Dilaudid; Fenofibrate; Gemfibrozil; Ranexa; Red yeast rice; Statins; and Zetia  Home Medications   Prior to Admission medications   Medication Sig Start Date End Date Taking? Authorizing Provider  amLODipine (NORVASC) 5 MG tablet Take 5 mg by mouth every morning.    Yes Historical Provider, MD  aspirin EC 325 MG tablet Take 1 tablet (325 mg total) by mouth 2 (two) times daily after a meal. Patient taking differently: Take 325 mg by mouth 2 (two) times daily after a meal. 1 DAILY 09/11/14  Yes Rod Can, MD  bimatoprost (LUMIGAN) 0.03 % ophthalmic solution Place 1 drop into both eyes at bedtime.    Yes Historical Provider, MD  cholecalciferol (VITAMIN D) 1000 UNITS tablet Take 1,000 Units by mouth every morning.    Yes Historical Provider, MD  clopidogrel (PLAVIX) 75 MG tablet Take 75 mg by mouth every morning.    Yes Historical Provider, MD  GARLIC PO Take 1 tablet by mouth every morning.    Yes Historical Provider, MD  isosorbide mononitrate (IMDUR) 120 MG 24 hr tablet Take 120 mg by mouth daily with lunch.    Yes Historical Provider, MD  Memantine HCl ER 21 MG CP24 Take 1 capsule by mouth daily.   Yes Historical Provider, MD  metoprolol (LOPRESSOR) 50 MG tablet Take 75 mg by mouth 2 (two) times daily.   Yes Historical Provider, MD  nitroGLYCERIN (NITROSTAT) 0.4 MG SL tablet Place 0.4 mg under the tongue every 5 (five) minutes as needed for chest pain.    Yes Historical Provider, MD  OVER THE COUNTER MEDICATION Take 1 capsule by mouth every morning. Mega Red Omega-3 Krill Oil   Yes  Historical Provider, MD  ranolazine (RANEXA) 1000 MG SR tablet Take 500 mg by mouth 2 (two) times daily.   Yes Historical Provider, MD  Timolol Maleate (ISTALOL) 0.5 % (DAILY) SOLN Place 1 drop into both eyes every morning.    Yes Historical Provider, MD  docusate sodium (COLACE) 100 MG capsule Take 1 capsule (100 mg total) by mouth 2 (two) times daily as needed for mild constipation. Patient not taking: Reported on 08/27/2014 02/19/14   Susa Day, MD  HYDROcodone-acetaminophen (NORCO/VICODIN) 5-325 MG per tablet Take 1-2 tablets by mouth every 4 (four) hours as needed (breakthrough pain). Patient not taking: Reported on 02/24/2015 09/11/14   Rod Can, MD  methocarbamol (ROBAXIN) 500 MG tablet Take 1 tablet (500 mg total) by mouth every 6 (six)  hours as needed for muscle spasms. Patient not taking: Reported on 02/24/2015 09/11/14   Rod Can, MD  ondansetron (ZOFRAN) 4 MG tablet Take 1 tablet (4 mg total) by mouth every 6 (six) hours as needed for nausea. Patient not taking: Reported on 02/24/2015 09/11/14   Rod Can, MD  senna (SENOKOT) 8.6 MG TABS tablet Take 2 tablets (17.2 mg total) by mouth at bedtime. Patient not taking: Reported on 02/24/2015 09/11/14   Rod Can, MD   BP 133/74 mmHg  Pulse 88  Temp(Src) 97.2 F (36.2 C) (Oral)  Resp 17  Ht '5\' 7"'$  (1.702 m)  Wt 170 lb (77.111 kg)  BMI 26.62 kg/m2  SpO2 97% Physical Exam 2130: Physical examination:  Nursing notes reviewed; Vital signs and O2 SAT reviewed;  Constitutional: Well developed, Well nourished, Well hydrated, In no acute distress; Head:  Normocephalic, atraumatic; Eyes: EOMI, PERRL, No scleral icterus; ENMT: Mouth and pharynx normal, Mucous membranes moist; Neck: Supple, Full range of motion, No lymphadenopathy; Cardiovascular: Regular rate and rhythm, No gallop; Respiratory: Breath sounds clear & equal bilaterally, No wheezes.  Speaking full sentences with ease, Normal respiratory effort/excursion; Chest:  Nontender, Movement normal; Abdomen: Soft, Nontender, Nondistended, Normal bowel sounds; Genitourinary: No CVA tenderness; Extremities: Pulses normal, No tenderness, No edema, No calf edema or asymmetry.; Neuro: AA&Ox3, Major CN grossly intact.  Speech clear. No gross focal motor or sensory deficits in extremities.; Skin: Color normal, Warm, Dry.   ED Course  Procedures (including critical care time) Labs Review   Imaging Review  I have personally reviewed and evaluated these images and lab results as part of my medical decision-making.   EKG Interpretation   Date/Time:  Tuesday February 24 2015 21:13:16 EDT Ventricular Rate:  87 PR Interval:  202 QRS Duration: 150 QT Interval:  415 QTC Calculation: 499 R Axis:   83 Text Interpretation:  Sinus rhythm Right bundle branch block Inferior  infarct, old Baseline wander Artifact When compared with ECG of 09/07/2014  No significant change was found Confirmed by [facility]  MD, Nunzio Cory [phone])  on 02/24/2015 9:25:54 PM      MDM  MDM Reviewed: previous chart, nursing note and vitals Reviewed previous: labs and ECG Interpretation: labs, ECG and x-ray     Results for orders placed or performed during the hospital encounter of 02/24/15  CBC  Result Value Ref Range   WBC 8.6 4.0 - 10.5 K/uL   RBC 4.51 4.22 - 5.81 MIL/uL   Hemoglobin 12.7 (L) 13.0 - 17.0 g/dL   HCT 38.4 (L) 39.0 - 52.0 %   MCV 85.1 78.0 - 100.0 fL   MCH 28.2 26.0 - 34.0 pg   MCHC 33.1 30.0 - 36.0 g/dL   RDW 15.2 11.5 - 15.5 %   Platelets 134 (L) 150 - 400 K/uL  I-stat troponin, ED  Result Value Ref Range   Troponin i, poc 0.00 0.00 - 0.08 ng/mL   Comment 3          I-stat Chem 8, ED  Result Value Ref Range   Sodium 140 135 - 145 mmol/L   Potassium 6.8 (HH) 3.5 - 5.1 mmol/L   Chloride 109 101 - 111 mmol/L   BUN 33 (H) 6 - 20 mg/dL   Creatinine, Ser 1.20 0.61 - 1.24 mg/dL   Glucose, Bld 189 (H) 65 - 99 mg/dL   Calcium, Ion 1.02 (L) 1.13 - 1.30 mmol/L   TCO2 23  0 - 100 mmol/L   Hemoglobin 13.3 13.0 -  17.0 g/dL   HCT 39.0 39.0 - 52.0 %   Comment NOTIFIED PHYSICIAN    Dg Chest 2 View 02/24/2015   CLINICAL DATA:  Chest, back and bilateral shoulder pain.  EXAM: CHEST  2 VIEW  COMPARISON:  None.  FINDINGS: Previous median sternotomy and CABG procedure. The heart size and mediastinal contours are within normal limits. Both lungs are clear. Scoliosis and degenerative disc disease noted within the thoracic spine.  IMPRESSION: No active cardiopulmonary disease.   Electronically Signed   By: Kerby Moors M.D.   On: 02/24/2015 21:45    2345:  I-stat chem with elevated potassium but no peaked T-waves on EKG; will repeat BMP.  Symptoms improved after SL ntg. Pt has significant cardiac hx, will admit. Dx and testing d/w pt and family.  Questions answered.  Verb understanding, agreeable to admit. T/C to [facility] Dr. Blaine Hamper, case discussed, including:  HPI, pertinent PM/SHx, VS/PE, dx testing, ED course and treatment:  Agreeable to admit, requests to write temporary orders, obtain tele bed to team MCAdmits.    Francine Graven, DO 02/27/15 1341

## 2015-02-24 NOTE — H&P (Addendum)
[facility] History and Physical  [name] ZJQ:[id number] DOB: 08/16/1926 DOA: 02/24/2015  Referring physician: ED physician PCP: Erik Reel, MD  Specialists:   Chief Complaint: chest pain  HPI: [name] is a 81 y.o. male with PMH of hypertension, hyperlipidemia, anxiety, prostate cancer, arthritis, CAD, s/p of stent 2013, and s/p of CABG 2000 and 2007, who presents with chest pain.  Patient reports that he started having chest pain when he was sitting watching TV at about 8 PM yesterday. Initially, his pain was located in bilateral shoulder area, then progressed to have involved front chest between shoulders. It was constant, severe sharp pain. It is not pleuritic. He was treated withone dose of aspirin and 3 dose of nitroglycerin, then chest pain has resolved. Currently no chest pain. Patient does not have cough, fever or chills. Does not have abdominal pain, diarrhea, symptoms of UTI or unilateral weakness. Of note, because of back pain, he received local injection by his orthopedic surgeon 2 weeks ago and today, therefore he has not been taking his aspirin and Plavix for 2 weeks.  In ED, patient was found to have negative troponin, WBC 8.0, temperature normal, no tachycardia, potassium 6.8-->4.0 on the repeated test, negative chest x-ray for acute abnormalities. Patient is admitted to inpatient for further evaluation and treatment.  Where does patient live?   At home   Can patient participate in ADLs?  Little  Review of Systems:   General: no fevers, chills, no changes in body weight, has fatigue HEENT: no blurry vision, hearing changes or sore throat Pulm: no dyspnea, coughing, wheezing CV: no chest pain, palpitations Abd: no nausea, vomiting, abdominal pain, diarrhea, constipation GU: no dysuria, burning on urination, increased urinary frequency, hematuria  Ext: no leg edema Neuro: no unilateral weakness, numbness, or tingling, no vision change or hearing loss. Has  back pain Skin: no rash MSK: No muscle spasm, no deformity, no limitation of range of movement in spin Heme: No easy bruising.  Travel history: No recent long distant travel.  Allergy:  Allergies  Allergen Reactions  . Dilaudid [Hydromorphone Hcl]     Prefers not to have"causes paranoia"  . Fenofibrate Other (See Comments)    Reaction unknown  . Gemfibrozil Other (See Comments)    Reaction unknown  . Red Yeast Rice [Cholestin] Other (See Comments)    Reaction unknown   . Statins Other (See Comments)    Muscle pain  . Zetia [Ezetimibe] Other (See Comments)    Reaction unknown     Past Medical History  Diagnosis Date  . H/O prostate cancer     Prior cryoablation   . Hyperlipidemia 06/26/2012  . S/P CABG (coronary artery bypass graft)   . Villous adenoma of rectum   . Anxiety   . CAD (coronary artery disease) 06/26/2012    PTCA RCA 1995 CABG w. LIMA to LAD, SVG to dx 1, PD and PL March 2000 Dr. Servando Snare for 3VD Cypher stent SVG to RCA  2005 redo CABG 07/2005 with SVG to OM and SVG to PD-PL   DR. TILLEY IS PT'S CARDIOLOGIST  . Memory difficulties   . Hearing loss     bilateral  . Arthritis   . Angina pectoris     Past Surgical History  Procedure Laterality Date  . Coronary artery bypass graft  2000, 2007    LIMA and SVGs to Diag 1 PDA PLR March 2000,  SVG to OM and SVG to PDA-PL 07/2005  . Partial colectomy  2007  . Prostate cryoablation    . Hernia repair    . Nasal septum surgery    . Lumbar laminectomy/decompression microdiscectomy N/A 02/19/2014    Procedure: MICRO LUMBAR DECOMPRESSION L4-5/L3-4;  Surgeon: Johnn Hai, MD;  Location: [facility];  Service: Orthopedics;  Laterality: N/A;  . Left heart catheterization with coronary/graft angiogram N/A 06/29/2012    Procedure: LEFT HEART CATHETERIZATION WITH Beatrix Fetters;  Surgeon: Sinclair Grooms, MD;  Location: [facility] CATH LAB;  Service: Cardiovascular;  Laterality: N/A;  . Percutaneous coronary stent  intervention (pci-s) N/A 07/03/2012    Procedure: PERCUTANEOUS CORONARY STENT INTERVENTION (PCI-S);  Surgeon: Sinclair Grooms, MD;  Location: [facility] CATH LAB;  Service: Cardiovascular;  Laterality: N/A;  . Left heart catheterization with coronary/graft angiogram N/A 02/28/2013    Procedure: LEFT HEART CATHETERIZATION WITH Beatrix Fetters;  Surgeon: Jacolyn Reedy, MD;  Location: [facility] CATH LAB;  Service: Cardiovascular;  Laterality: N/A;  . Cataract Bilateral     08-13-14  . Total hip arthroplasty Left 09/04/2014    Procedure: LEFT TOTAL HIP ARTHROPLASTY ANTERIOR APPROACH;  Surgeon: Elie Goody, MD;  Location: [facility];  Service: Orthopedics;  Laterality: Left;  . Back surgery    . Hip surgery      Social History:  reports that he has quit smoking. He has never used smokeless tobacco. He reports that he drinks about 0.6 oz of alcohol per week. He reports that he does not use illicit drugs.  Family History:  Family History  Problem Relation Age of Onset  . Prostate cancer Father   . Lung cancer Mother   . CVA Brother   . Brain cancer Daughter      Prior to Admission medications   Medication Sig Start Date End Date Taking? Authorizing Provider  amLODipine (NORVASC) 5 MG tablet Take 5 mg by mouth every morning.    Yes Historical Provider, MD  aspirin EC 325 MG tablet Take 1 tablet (325 mg total) by mouth 2 (two) times daily after a meal. Patient taking differently: Take 325 mg by mouth 2 (two) times daily after a meal. 1 DAILY 09/11/14  Yes Rod Can, MD  bimatoprost (LUMIGAN) 0.03 % ophthalmic solution Place 1 drop into both eyes at bedtime.    Yes Historical Provider, MD  cholecalciferol (VITAMIN D) 1000 UNITS tablet Take 1,000 Units by mouth every morning.    Yes Historical Provider, MD  clopidogrel (PLAVIX) 75 MG tablet Take 75 mg by mouth every morning.    Yes Historical Provider, MD  GARLIC PO Take 1 tablet by mouth every morning.    Yes Historical Provider, MD   isosorbide mononitrate (IMDUR) 120 MG 24 hr tablet Take 120 mg by mouth daily with lunch.    Yes Historical Provider, MD  Memantine HCl ER 21 MG CP24 Take 1 capsule by mouth daily.   Yes Historical Provider, MD  metoprolol (LOPRESSOR) 50 MG tablet Take 75 mg by mouth 2 (two) times daily.   Yes Historical Provider, MD  nitroGLYCERIN (NITROSTAT) 0.4 MG SL tablet Place 0.4 mg under the tongue every 5 (five) minutes as needed for chest pain.    Yes Historical Provider, MD  OVER THE COUNTER MEDICATION Take 1 capsule by mouth every morning. Mega Red Omega-3 Krill Oil   Yes Historical Provider, MD  ranolazine (RANEXA) 1000 MG SR tablet Take 500 mg by mouth 2 (two) times daily.   Yes Historical Provider, MD  Timolol Maleate (ISTALOL) 0.5 % (DAILY)  SOLN Place 1 drop into both eyes every morning.    Yes Historical Provider, MD  docusate sodium (COLACE) 100 MG capsule Take 1 capsule (100 mg total) by mouth 2 (two) times daily as needed for mild constipation. Patient not taking: Reported on 08/27/2014 02/19/14   Susa Day, MD  HYDROcodone-acetaminophen (NORCO/VICODIN) 5-325 MG per tablet Take 1-2 tablets by mouth every 4 (four) hours as needed (breakthrough pain). Patient not taking: Reported on 02/24/2015 09/11/14   Rod Can, MD  methocarbamol (ROBAXIN) 500 MG tablet Take 1 tablet (500 mg total) by mouth every 6 (six) hours as needed for muscle spasms. Patient not taking: Reported on 02/24/2015 09/11/14   Rod Can, MD  ondansetron (ZOFRAN) 4 MG tablet Take 1 tablet (4 mg total) by mouth every 6 (six) hours as needed for nausea. Patient not taking: Reported on 02/24/2015 09/11/14   Rod Can, MD  senna (SENOKOT) 8.6 MG TABS tablet Take 2 tablets (17.2 mg total) by mouth at bedtime. Patient not taking: Reported on 02/24/2015 09/11/14   Rod Can, MD    Physical Exam: Filed Vitals:   02/24/15 2315 02/24/15 2330 02/25/15 0015 02/25/15 0047  BP: 151/75 133/74 142/69 159/71  Pulse: 88 88 89  89  Temp:    97.8 F (36.6 C)  TempSrc:    Oral  Resp: '16 17 13   '$ Height:    '5\' 7"'$  (1.702 m)  Weight:    74.2 kg (163 lb 9.3 oz)  SpO2: 97% 97% 97% 98%   General: Not in acute distress HEENT:       Eyes: PERRL, EOMI, no scleral icterus.       ENT: No discharge from the ears and nose, no pharynx injection, no tonsillar enlargement.        Neck: No JVD, no bruit, no mass felt. Heme: No neck lymph node enlargement. Cardiac: S1/S2, RRR, No murmurs, No gallops or rubs. Pulm: No rales, wheezing, rhonchi or rubs. Abd: Soft, nondistended, nontender, no rebound pain, no organomegaly, BS present. Ext: No pitting leg edema bilaterally. 2+DP/PT pulse bilaterally. Musculoskeletal: No joint deformities, No joint redness or warmth, no limitation of ROM in spin. Skin: No rashes.  Neuro: Alert, oriented X3, cranial nerves II-XII grossly intact, muscle strength 5/5 in all extremities, sensation to light touch intact.  Psych: Patient is not psychotic, no suicidal or hemocidal ideation.  Labs on Admission:  Basic Metabolic Panel:  Recent Labs Lab 02/24/15 0017 02/24/15 2317 02/25/15 0150  NA 142 140  --   K 4.1 6.8* 4.0  CL 109 109  --   CO2 23  --   --   GLUCOSE 159* 189*  --   BUN 20 33*  --   CREATININE 1.37* 1.20  --   CALCIUM 8.9  --   --    Liver Function Tests: No results for input(s): AST, ALT, ALKPHOS, BILITOT, PROT, ALBUMIN in the last 168 hours. No results for input(s): LIPASE, AMYLASE in the last 168 hours. No results for input(s): AMMONIA in the last 168 hours. CBC:  Recent Labs Lab 02/24/15 2203 02/24/15 2317 02/25/15 0600  WBC 8.6  --  10.6*  HGB 12.7* 13.3 12.6*  HCT 38.4* 39.0 37.7*  MCV 85.1  --  85.5  PLT 134*  --  PENDING   Cardiac Enzymes:  Recent Labs Lab 02/25/15 0150  TROPONINI <0.03    BNP (last 3 results) No results for input(s): BNP in the last 8760 hours.  ProBNP (last 3 results)  No results for input(s): PROBNP in the last 8760  hours.  CBG: No results for input(s): GLUCAP in the last 168 hours.  Radiological Exams on Admission: Dg Chest 2 View  02/24/2015   CLINICAL DATA:  Chest, back and bilateral shoulder pain.  EXAM: CHEST  2 VIEW  COMPARISON:  None.  FINDINGS: Previous median sternotomy and CABG procedure. The heart size and mediastinal contours are within normal limits. Both lungs are clear. Scoliosis and degenerative disc disease noted within the thoracic spine.  IMPRESSION: No active cardiopulmonary disease.   Electronically Signed   By: Kerby Moors M.D.   On: 02/24/2015 21:45    EKG: Independently reviewed.  Abnormal findings:  QTC 499, right bundle blockage which existed in previous EKG on 09/07/14.   Assessment/Plan Principal Problem:   Chest pain Active Problems:   CAD (coronary artery disease)   S/P CABG (coronary artery bypass graft)   Hyperlipidemia   History of prostate cancer   Spinal stenosis of lumbar region at multiple levels   Osteoarthritis of left hip   Essential hypertension  Chest pain and CAD: No pneumonia on chest x-ray. Currently no chest pain, less likely to have PE. Given his significant history of CAD and s/p of CABG, need to r/o ACS  - will admit to Tele bed  - cycle CE q6 x3 and repeat her EKG in the am  - Nitroglycerin, Morphine, Imdure, Metoprolol, renolazine - resume his aspirin, plavix - pt is allergic to statin and Zetia - Risk factor stratification: will check FLP and A1C  -Hx of CABG, HTN, HLD, who presents with chest pain, which resolved after 3 doses of nitroglycerin and ASA-->f/u trop x 3 and 2d echo.  - 2d echo -Card was consulted and will see in am.  Spinal stenosis of lumbar region at multiple levels: received local injection by ortho twice. Now has mild pain. -prn tylnelol  Essential hypertension: -On metoprolol and amlodipine  DVT ppx: SQ Heparin   Code Status: Full code Family Communication:  Yes, patient's wife  at bed side Disposition Plan:  Admit to inpatient   Date of Service 02/25/2015    Ivor Costa [facility] Pager [phone]  If 7PM-7AM, please contact night-coverage [email or web address] Password [facility] 02/25/2015, 6:37 AM

## 2015-02-24 NOTE — ED Notes (Signed)
Pt reports to the ED for eval of CP that began tonight. He arrives via [facility]. He took 2 nitros and his pain went from a 10/10 to a 4/10. He received 324 ASA en route and 1 nitro and the pain resolved. 12 lead unremarkable. Pt is supposed to have a shot in his back and reports he was taken off all of his medication including his blood thinner. Pt denies any SOB, N/V, lightheadedness, or dizziness. Pt has a significant cardiac hx. Pt A&Ox4, resp e/u, and skin warm and dry.

## 2015-02-25 ENCOUNTER — Ambulatory Visit (HOSPITAL_COMMUNITY): Payer: Medicare Other

## 2015-02-25 DIAGNOSIS — I10 Essential (primary) hypertension: Secondary | ICD-10-CM | POA: Diagnosis present

## 2015-02-25 DIAGNOSIS — R072 Precordial pain: Secondary | ICD-10-CM

## 2015-02-25 DIAGNOSIS — I2510 Atherosclerotic heart disease of native coronary artery without angina pectoris: Secondary | ICD-10-CM | POA: Diagnosis not present

## 2015-02-25 DIAGNOSIS — M4806 Spinal stenosis, lumbar region: Secondary | ICD-10-CM | POA: Diagnosis not present

## 2015-02-25 DIAGNOSIS — E785 Hyperlipidemia, unspecified: Secondary | ICD-10-CM | POA: Diagnosis not present

## 2015-02-25 DIAGNOSIS — R079 Chest pain, unspecified: Secondary | ICD-10-CM | POA: Diagnosis not present

## 2015-02-25 DIAGNOSIS — D375 Neoplasm of uncertain behavior of rectum: Secondary | ICD-10-CM | POA: Diagnosis not present

## 2015-02-25 LAB — GLUCOSE, CAPILLARY
GLUCOSE-CAPILLARY: 99 mg/dL (ref 65–99)
GLUCOSE-CAPILLARY: 115 mg/dL — AB (ref 65–99)
Glucose-Capillary: 154 mg/dL — ABNORMAL HIGH (ref 65–99)
Glucose-Capillary: 156 mg/dL — ABNORMAL HIGH (ref 65–99)

## 2015-02-25 LAB — COMPREHENSIVE METABOLIC PANEL
ALK PHOS: 61 U/L (ref 38–126)
ALT: 15 U/L — ABNORMAL LOW (ref 17–63)
ANION GAP: 6 (ref 5–15)
AST: 16 U/L (ref 15–41)
Albumin: 3.4 g/dL — ABNORMAL LOW (ref 3.5–5.0)
BILIRUBIN TOTAL: 0.6 mg/dL (ref 0.3–1.2)
BUN: 17 mg/dL (ref 6–20)
CALCIUM: 8.7 mg/dL — AB (ref 8.9–10.3)
CO2: 24 mmol/L (ref 22–32)
Chloride: 113 mmol/L — ABNORMAL HIGH (ref 101–111)
Creatinine, Ser: 1.14 mg/dL (ref 0.61–1.24)
GFR calc non Af Amer: 56 mL/min — ABNORMAL LOW (ref >60)
Glucose, Bld: 119 mg/dL — ABNORMAL HIGH (ref 65–99)
Potassium: 4 mmol/L (ref 3.5–5.1)
Sodium: 143 mmol/L (ref 135–145)
TOTAL PROTEIN: 6.1 g/dL — AB (ref 6.5–8.1)

## 2015-02-25 LAB — BASIC METABOLIC PANEL
Anion gap: 10 (ref 5–15)
BUN: 20 mg/dL (ref 6–20)
CALCIUM: 8.9 mg/dL (ref 8.9–10.3)
CO2: 23 mmol/L (ref 22–32)
CREATININE: 1.37 mg/dL — AB (ref 0.61–1.24)
Chloride: 109 mmol/L (ref 101–111)
GFR calc Af Amer: 52 mL/min — ABNORMAL LOW (ref >60)
GFR calc non Af Amer: 45 mL/min — ABNORMAL LOW (ref >60)
GLUCOSE: 159 mg/dL — AB (ref 65–99)
Potassium: 4.1 mmol/L (ref 3.5–5.1)
Sodium: 142 mmol/L (ref 135–145)

## 2015-02-25 LAB — LIPID PANEL
CHOL/HDL RATIO: 5.5 ratio
Cholesterol: 171 mg/dL (ref 0–200)
HDL: 31 mg/dL — AB (ref >40)
LDL CALC: 99 mg/dL (ref 0–99)
TRIGLYCERIDES: 205 mg/dL — AB (ref <150)
VLDL: 41 mg/dL — ABNORMAL HIGH (ref 0–40)

## 2015-02-25 LAB — CBC
HEMATOCRIT: 37.7 % — AB (ref 39.0–52.0)
Hemoglobin: 12.6 g/dL — ABNORMAL LOW (ref 13.0–17.0)
MCH: 28.6 pg (ref 26.0–34.0)
MCHC: 33.4 g/dL (ref 30.0–36.0)
MCV: 85.5 fL (ref 78.0–100.0)
Platelets: 112 10*3/uL — ABNORMAL LOW (ref 150–400)
RBC: 4.41 MIL/uL (ref 4.22–5.81)
RDW: 14.8 % (ref 11.5–15.5)
WBC: 10.6 10*3/uL — AB (ref 4.0–10.5)

## 2015-02-25 LAB — TROPONIN I
TROPONIN I: 0.05 ng/mL — AB (ref <0.031)
TROPONIN I: 0.05 ng/mL — AB (ref <0.031)

## 2015-02-25 LAB — APTT: aPTT: 29 seconds (ref 24–37)

## 2015-02-25 LAB — POTASSIUM: Potassium: 4 mmol/L (ref 3.5–5.1)

## 2015-02-25 LAB — PROTIME-INR
INR: 1.35 (ref 0.00–1.49)
Prothrombin Time: 16.8 seconds — ABNORMAL HIGH (ref 11.6–15.2)

## 2015-02-25 MED ORDER — NITROGLYCERIN 0.4 MG SL SUBL: 0.4000 mg | SUBLINGUAL_TABLET | SUBLINGUAL | Status: DC | PRN

## 2015-02-25 MED ORDER — HEPARIN SODIUM (PORCINE) 5000 UNIT/ML IJ SOLN
5000.0000 [IU] | Freq: Three times a day (TID) | INTRAMUSCULAR | Status: DC
  Administered 2015-02-25 – 2015-02-26 (×4): 5000 [IU] via SUBCUTANEOUS
  Filled 2015-02-25 (×4): qty 1

## 2015-02-25 MED ORDER — TIMOLOL MALEATE 0.5 % OP SOLN
1.0000 [drp] | Freq: Every day | OPHTHALMIC | Status: DC
  Administered 2015-02-25 – 2015-02-26 (×2): 1 [drp] via OPHTHALMIC
  Filled 2015-02-25: qty 5

## 2015-02-25 MED ORDER — ISOSORBIDE MONONITRATE ER 60 MG PO TB24
120.0000 mg | ORAL_TABLET | Freq: Every day | ORAL | Status: DC
  Administered 2015-02-25 – 2015-02-26 (×2): 120 mg via ORAL
  Filled 2015-02-25 (×2): qty 2

## 2015-02-25 MED ORDER — VITAMIN D 1000 UNITS PO TABS
1000.0000 [IU] | ORAL_TABLET | Freq: Every morning | ORAL | Status: DC
  Administered 2015-02-25 – 2015-02-26 (×2): 1000 [IU] via ORAL
  Filled 2015-02-25 (×2): qty 1

## 2015-02-25 MED ORDER — AMLODIPINE BESYLATE 5 MG PO TABS
5.0000 mg | ORAL_TABLET | Freq: Every morning | ORAL | Status: DC
  Administered 2015-02-25 – 2015-02-26 (×2): 5 mg via ORAL
  Filled 2015-02-25 (×2): qty 1

## 2015-02-25 MED ORDER — TIMOLOL MALEATE (ONCE-DAILY) 0.5 % OP SOLN: 1.0000 [drp] | Freq: Every morning | OPHTHALMIC | Status: DC

## 2015-02-25 MED ORDER — RANOLAZINE ER 500 MG PO TB12
500.0000 mg | ORAL_TABLET | Freq: Two times a day (BID) | ORAL | Status: DC
  Administered 2015-02-25 – 2015-02-26 (×3): 500 mg via ORAL
  Filled 2015-02-25 (×4): qty 1

## 2015-02-25 MED ORDER — ALUM & MAG HYDROXIDE-SIMETH 200-200-20 MG/5ML PO SUSP: 30.0000 mL | Freq: Four times a day (QID) | ORAL | Status: DC | PRN

## 2015-02-25 MED ORDER — SODIUM CHLORIDE 0.9 % IJ SOLN
3.0000 mL | Freq: Two times a day (BID) | INTRAMUSCULAR | Status: DC
Start: 2015-02-25 — End: 2015-02-26
  Administered 2015-02-25 – 2015-02-26 (×3): 3 mL via INTRAVENOUS

## 2015-02-25 MED ORDER — NITROGLYCERIN 0.4 MG SL SUBL
0.4000 mg | SUBLINGUAL_TABLET | SUBLINGUAL | Status: DC | PRN
  Administered 2015-02-25: 0.4 mg via SUBLINGUAL
  Filled 2015-02-25: qty 1

## 2015-02-25 MED ORDER — MORPHINE SULFATE (PF) 2 MG/ML IV SOLN: 2.0000 mg | INTRAVENOUS | Status: DC | PRN

## 2015-02-25 MED ORDER — ACETAMINOPHEN 650 MG RE SUPP
650.0000 mg | Freq: Four times a day (QID) | RECTAL | Status: DC | PRN
Start: 2015-02-25 — End: 2015-02-26

## 2015-02-25 MED ORDER — SODIUM CHLORIDE 0.9 % IV SOLN
INTRAVENOUS | Status: DC
  Administered 2015-02-25: 01:00:00 via INTRAVENOUS

## 2015-02-25 MED ORDER — GARLIC 100 MG PO TABS: ORAL_TABLET | Freq: Every morning | ORAL | Status: DC

## 2015-02-25 MED ORDER — LATANOPROST 0.005 % OP SOLN
1.0000 [drp] | Freq: Every day | OPHTHALMIC | Status: DC
  Filled 2015-02-25: qty 2.5

## 2015-02-25 MED ORDER — CLOPIDOGREL BISULFATE 75 MG PO TABS
75.0000 mg | ORAL_TABLET | Freq: Every morning | ORAL | Status: DC
  Administered 2015-02-25 – 2015-02-26 (×2): 75 mg via ORAL
  Filled 2015-02-25 (×2): qty 1

## 2015-02-25 MED ORDER — ASPIRIN EC 325 MG PO TBEC
325.0000 mg | DELAYED_RELEASE_TABLET | Freq: Every day | ORAL | Status: DC
  Administered 2015-02-25 – 2015-02-26 (×2): 325 mg via ORAL
  Filled 2015-02-25 (×2): qty 1

## 2015-02-25 MED ORDER — METOPROLOL TARTRATE 50 MG PO TABS
75.0000 mg | ORAL_TABLET | Freq: Two times a day (BID) | ORAL | Status: DC
  Administered 2015-02-25 – 2015-02-26 (×3): 75 mg via ORAL
  Filled 2015-02-25 (×8): qty 1

## 2015-02-25 MED ORDER — ACETAMINOPHEN 325 MG PO TABS: 650.0000 mg | ORAL_TABLET | Freq: Four times a day (QID) | ORAL | Status: DC | PRN

## 2015-02-25 MED ORDER — MEMANTINE HCL ER 14 MG PO CP24
21.0000 mg | ORAL_CAPSULE | Freq: Every day | ORAL | Status: DC
  Administered 2015-02-25 – 2015-02-26 (×2): 21 mg via ORAL
  Filled 2015-02-25 (×2): qty 1

## 2015-02-25 MED ORDER — MEMANTINE HCL ER 21 MG PO CP24: 1.0000 | ORAL_CAPSULE | Freq: Every day | ORAL | Status: DC

## 2015-02-25 NOTE — Progress Notes (Signed)
Pt had 15/10 Chest pain this am after eating breakfast that was relieved by 1 SL nitro. Stat EKG was obtained. Cardiology was notified and reviewed EKG. Pt allowed to ambulate in hallway with PT and had no chest pain with activity. Troponin came back slightly positive at 0.05 and cardiology notified again. Will continue to monitor and cycle troponin.

## 2015-02-25 NOTE — Progress Notes (Addendum)
[facility] PROGRESS NOTE  RODGERS LIKES XBD:[id number] DOB: 1927/05/17 DOA: 02/24/2015 PCP: Precious Reel, MD  Brief Summary  [name] is a 81 y.o. male with PMH of hypertension, hyperlipidemia, anxiety, prostate cancer, arthritis, CAD, s/p of stent 2013, and s/p of CABG 2000 and 2007, who presented with bilateral shoulder area chest pain which progressed to have involved front chest between shoulders. It was constant, severe sharp pain that improved with aspirin and 3 doses of nitroglycerin.  Of note, because of back pain, he received local injection by his orthopedic surgeon 2 weeks ago and therefore he has not been taking his aspirin and Plavix for 2 weeks. Negative troponin, chest x-ray for acute abnormalities, and no ischemic changes on ECG.  Assessment/Plan  Chest pain in setting of known CAD: No pneumonia on chest x-ray.  He had held ALL of his antianginal medications including his beta blocker, CCB, and ranolazine for the last few weeks in addition to his plavix and aspirin and therefore may have been having increased symptoms of stable angina.    -  Third troponin 0.05 > I have added on additional troponins -  Tele:  NSR with PVCs -  Resumed nitroglycerin, Morphine, Imdure, Metoprolol, renolazine - resumed his aspirin, plavix but at risk for complications from injection the other day - pt is allergic to statin and Zetia - Risk factor stratification: will check FLP and A1C - LDL 99 - Appreciate Cards assistance -  Agree that if resuming his home medications relieves his pains that he is stable for discharge tomorrow -  PT was ordered, but he is ambulating in the halls with his rolling walker without problem  Spinal stenosis of lumbar region at multiple levels: received local injection by ortho twice. Now has mild pain. -prn tylnelol -  PT/OT to return later today to work with patient  Essential hypertension: -On metoprolol and amlodipine  AKI, resolved with IVF.  -   D/c IVF  Diet:  Healthy heart Access:  PIV IVF:  off Proph:  heparing  Code Status: full Family Communication: patient and extended family Disposition Plan: pending working with PT/OT, anticipate to home   Consultants:  Cardiology   Procedures:  CXR  Antibiotics:  none   HPI/Subjective:  Had 15/10 severe pain in shoulders that moved to the mid-chest this morning that resolved with one nitroglycerin.  Currently feeling fine except for his severe back pain which is chronic and the reason he has been attempting injections by Dr. Nelva Bush.    Objective: Filed Vitals:   02/25/15 0943 02/25/15 1021 02/25/15 1025 02/25/15 1326  BP: 126/65 182/87 122/67 93/52  Pulse:    61  Temp:    97.7 F (36.5 C)  TempSrc:    Oral  Resp:    15  Height:      Weight:      SpO2:    96%   No intake or output data in the 24 hours ending 02/25/15 1343 Filed Weights   02/24/15 2115 02/25/15 0047  Weight: 77.111 kg (170 lb) 74.2 kg (163 lb 9.3 oz)   Body mass index is 25.61 kg/(m^2).  Exam:   General:  Adult male, No acute distress  HEENT:  NCAT, MMM  Cardiovascular:  RRR, nl S1, S2 no mrg, 2+ pulses, warm extremities  Respiratory:  CTAB, no increased WOB  Abdomen:   NABS, soft, NT/ND  MSK:   Normal tone and bulk, no LEE  Neuro:  Grossly intact  Data Reviewed:  Basic Metabolic Panel:  Recent Labs Lab 02/24/15 0017 02/24/15 2317 02/25/15 0150 02/25/15 0600  NA 142 140  --  143  K 4.1 6.8* 4.0 4.0  CL 109 109  --  113*  CO2 23  --   --  24  GLUCOSE 159* 189*  --  119*  BUN 20 33*  --  17  CREATININE 1.37* 1.20  --  1.14  CALCIUM 8.9  --   --  8.7*   Liver Function Tests:  Recent Labs Lab 02/25/15 0600  AST 16  ALT 15*  ALKPHOS 61  BILITOT 0.6  PROT 6.1*  ALBUMIN 3.4*   No results for input(s): LIPASE, AMYLASE in the last 168 hours. No results for input(s): AMMONIA in the last 168 hours. CBC:  Recent Labs Lab 02/24/15 2203 02/24/15 2317 02/25/15 0600   WBC 8.6  --  10.6*  HGB 12.7* 13.3 12.6*  HCT 38.4* 39.0 37.7*  MCV 85.1  --  85.5  PLT 134*  --  112*    No results found for this or any previous visit (from the past 240 hour(s)).   Studies: Dg Chest 2 View  02/24/2015   CLINICAL DATA:  Chest, back and bilateral shoulder pain.  EXAM: CHEST  2 VIEW  COMPARISON:  None.  FINDINGS: Previous median sternotomy and CABG procedure. The heart size and mediastinal contours are within normal limits. Both lungs are clear. Scoliosis and degenerative disc disease noted within the thoracic spine.  IMPRESSION: No active cardiopulmonary disease.   Electronically Signed   By: Kerby Moors M.D.   On: 02/24/2015 21:45    Scheduled Meds: . amLODipine  5 mg Oral q morning - 10a  . aspirin EC  325 mg Oral Daily  . cholecalciferol  1,000 Units Oral q morning - 10a  . clopidogrel  75 mg Oral q morning - 10a  . heparin  5,000 Units Subcutaneous 3 times per day  . isosorbide mononitrate  120 mg Oral Q lunch  . latanoprost  1 drop Both Eyes QHS  . memantine  21 mg Oral Daily  . metoprolol  75 mg Oral BID  . ranolazine  500 mg Oral BID  . sodium chloride  3 mL Intravenous Q12H  . timolol  1 drop Both Eyes Daily   Continuous Infusions: . sodium chloride 75 mL/hr at 02/25/15 0100    Principal Problem:   Chest pain Active Problems:   CAD (coronary artery disease)   S/P CABG (coronary artery bypass graft)   Hyperlipidemia   History of prostate cancer   Spinal stenosis of lumbar region at multiple levels   Osteoarthritis of left hip   Essential hypertension    Time spent: 30 min    Koleson Reifsteck, [facility] Pager [phone]. If 7PM-7AM, please contact night-coverage at [email or web address], password [facility] 02/25/2015, 1:43 PM  LOS: 0 days

## 2015-02-25 NOTE — Consult Note (Signed)
Cardiology Consult Note  Admit date: 02/24/2015 Name: [name] 81 y.o.  male DOB:  [birth year] MRN:  [id number]  Today's date:  02/25/2015  Referring Physician:    [facility]  Primary Physician:    Dr. Shon Baton  Reason for Consultation:    Prolonged chest pain  IMPRESSIONS: 1.  Isolated prolonged episode of chest pain that could've been angina in a patient with multiple sites of ischemia but not good candidate for PCI.  This prolonged episode of chest discomfort occurred at rest and in the setting of having stopped his antiplatelets therapy for spinal injection. 2.  Coronary artery disease with previous bypass grafting and previous multiple PCI's of the left main and native vessels with stenting.  According to Dr. Tamala Julian he was not a candidate for repeat PCI of these segments because of diffuse disease and multiple areas of ischemia 3.  Severe spinal stenosis and low back pain 4.  Hyperlipidemia with statin intolerance  RECOMMENDATION:  He is currently pain-free now.  His enzymes are negative and his EKG shows no ischemic changes.  During chest pain he does not have significant ST depression.  I think he can go ahead and eat.  There is no role for stress testing here.  I would resume his antiplatelets and his other therapy let him ambulate today.  If he has no recurrent pain today I think he can go home tomorrow.  One would need to question whether the spinal injections or any benefit as he has not had symptomatic relief from them and stopping then platelets may provoke ischemia in him.  HISTORY: This 81 year old male has a long history of coronary artery disease.  He had bypass grafting in 2000 following a previous PCI in 1994.  Beginning in 2005 he began to have a series of stents placed the saphenous vein grafts and had a redo bypass grafting in 2007.  Starting in 2011 he had stenting of the vein graft to the marginal branch and then had stents placed later in 2013 to the distal  left main, marginal graft, and right coronary artery.  He has done relatively well since then and had a recath about 2 years ago showing an occluded vein graft to the diagonal, patent stent of the left main and right coronary artery patent mammary graft and he had multiple sites of ischemia noted to the diagonal, right coronary artery, septum and intermediate branch.  He has had occasional angina since then.  He has had severe arthritis of the hip as well as severe low back pain.  He recently stopped his antiplatelets for series of injections in his lumbar spine.  He had an ejection yesterday and last evening or watching television had the onset of substernal shoulder pain and chest pain and took a series of 3 nitroglycerin.  The pain became severe and he eventually came to the emergency room.  An EKG showed baseline wander.  Troponins were negative and the pain eventually went away last night.  He is pain-free this morning.  He has occasional angina around once per week.  Past Medical History  Diagnosis Date  . H/O prostate cancer     Prior cryoablation   . Hyperlipidemia 06/26/2012  . S/P CABG (coronary artery bypass graft)   . Villous adenoma of rectum   . Anxiety   . CAD (coronary artery disease) 06/26/2012    PTCA RCA 1995 CABG w. LIMA to LAD, SVG to dx 1, PD and PL March 2000 Dr.  Gerhardt for 3VD Cypher stent SVG to RCA  2005 redo CABG 07/2005 with SVG to OM and SVG to PD-PL   DR. Louine Tenpenny IS PT'S CARDIOLOGIST  . Memory difficulties   . Hearing loss     bilateral  . Arthritis   . Angina pectoris      Past Surgical History  Procedure Laterality Date  . Coronary artery bypass graft  2000, 2007    LIMA and SVGs to Diag 1 PDA PLR March 2000,  SVG to OM and SVG to PDA-PL 07/2005  . Partial colectomy  2007  . Prostate cryoablation    . Hernia repair    . Nasal septum surgery    . Lumbar laminectomy/decompression microdiscectomy N/A 02/19/2014    Procedure: MICRO LUMBAR DECOMPRESSION  L4-5/L3-4;  Surgeon: Johnn Hai, MD;  Location: [facility];  Service: Orthopedics;  Laterality: N/A;  . Left heart catheterization with coronary/graft angiogram N/A 06/29/2012    Procedure: LEFT HEART CATHETERIZATION WITH Beatrix Fetters;  Surgeon: Sinclair Grooms, MD;  Location: [facility] CATH LAB;  Service: Cardiovascular;  Laterality: N/A;  . Percutaneous coronary stent intervention (pci-s) N/A 07/03/2012    Procedure: PERCUTANEOUS CORONARY STENT INTERVENTION (PCI-S);  Surgeon: Sinclair Grooms, MD;  Location: [facility] CATH LAB;  Service: Cardiovascular;  Laterality: N/A;  . Left heart catheterization with coronary/graft angiogram N/A 02/28/2013    Procedure: LEFT HEART CATHETERIZATION WITH Beatrix Fetters;  Surgeon: Jacolyn Reedy, MD;  Location: [facility] CATH LAB;  Service: Cardiovascular;  Laterality: N/A;  . Cataract Bilateral     08-13-14  . Total hip arthroplasty Left 09/04/2014    Procedure: LEFT TOTAL HIP ARTHROPLASTY ANTERIOR APPROACH;  Surgeon: Elie Goody, MD;  Location: [facility];  Service: Orthopedics;  Laterality: Left;  . Back surgery    . Hip surgery      Allergies:  is allergic to dilaudid; fenofibrate; gemfibrozil; red yeast rice; statins; and zetia.   Medications: Prior to Admission medications   Medication Sig Start Date End Date Taking? Authorizing Provider  amLODipine (NORVASC) 5 MG tablet Take 5 mg by mouth every morning.    Yes Historical Provider, MD  aspirin EC 325 MG tablet Take 1 tablet (325 mg total) by mouth 2 (two) times daily after a meal. Patient taking differently: Take 325 mg by mouth 2 (two) times daily after a meal. 1 DAILY 09/11/14  Yes Rod Can, MD  bimatoprost (LUMIGAN) 0.03 % ophthalmic solution Place 1 drop into both eyes at bedtime.    Yes Historical Provider, MD  cholecalciferol (VITAMIN D) 1000 UNITS tablet Take 1,000 Units by mouth every morning.    Yes Historical Provider, MD  clopidogrel (PLAVIX) 75 MG tablet Take 75 mg by mouth  every morning.    Yes Historical Provider, MD  GARLIC PO Take 1 tablet by mouth every morning.    Yes Historical Provider, MD  isosorbide mononitrate (IMDUR) 120 MG 24 hr tablet Take 120 mg by mouth daily with lunch.    Yes Historical Provider, MD  Memantine HCl ER 21 MG CP24 Take 1 capsule by mouth daily.   Yes Historical Provider, MD  metoprolol (LOPRESSOR) 50 MG tablet Take 75 mg by mouth 2 (two) times daily.   Yes Historical Provider, MD  nitroGLYCERIN (NITROSTAT) 0.4 MG SL tablet Place 0.4 mg under the tongue every 5 (five) minutes as needed for chest pain.    Yes Historical Provider, MD  OVER THE COUNTER MEDICATION Take 1 capsule by mouth every morning.  Mega Red Omega-3 Krill Oil   Yes Historical Provider, MD  ranolazine (RANEXA) 1000 MG SR tablet Take 500 mg by mouth 2 (two) times daily.   Yes Historical Provider, MD  Timolol Maleate (ISTALOL) 0.5 % (DAILY) SOLN Place 1 drop into both eyes every morning.    Yes Historical Provider, MD  docusate sodium (COLACE) 100 MG capsule Take 1 capsule (100 mg total) by mouth 2 (two) times daily as needed for mild constipation. Patient not taking: Reported on 08/27/2014 02/19/14   Susa Day, MD  HYDROcodone-acetaminophen (NORCO/VICODIN) 5-325 MG per tablet Take 1-2 tablets by mouth every 4 (four) hours as needed (breakthrough pain). Patient not taking: Reported on 02/24/2015 09/11/14   Rod Can, MD  methocarbamol (ROBAXIN) 500 MG tablet Take 1 tablet (500 mg total) by mouth every 6 (six) hours as needed for muscle spasms. Patient not taking: Reported on 02/24/2015 09/11/14   Rod Can, MD  ondansetron (ZOFRAN) 4 MG tablet Take 1 tablet (4 mg total) by mouth every 6 (six) hours as needed for nausea. Patient not taking: Reported on 02/24/2015 09/11/14   Rod Can, MD  senna (SENOKOT) 8.6 MG TABS tablet Take 2 tablets (17.2 mg total) by mouth at bedtime. Patient not taking: Reported on 02/24/2015 09/11/14   Rod Can, MD    Family  History: Family Status  Relation Status Death Age  . Father Deceased 33  . Mother Deceased 70  . Brother Deceased 36  . Sister Alive   . Daughter      history of brain cancer    Social History:   reports that he has quit smoking. He has never used smokeless tobacco. He reports that he drinks about 0.6 oz of alcohol per week. He reports that he does not use illicit drugs.   Social History   Social History Narrative   Retired, worked in Engineer, water    Review of Systems: He has some mild memory issues according to the family.  He has severe hip pain as well as back pain and has difficulty standing on his leg.  He has some malaise and fatigue.  He has had prostate cancer with prior cryoablation and does have some nocturia as well as urgency.  Other than as noted above remainder of the review of systems is unremarkable.    Physical Exam: BP 159/71 mmHg  Pulse 89  Temp(Src) 97.8 F (36.6 C) (Oral)  Resp 13  Ht '5\' 7"'$  (1.702 m)  Wt 74.2 kg (163 lb 9.3 oz)  BMI 25.61 kg/m2  SpO2 98%  General appearance: Pleasant male in no acute distress lying in bed, appears younger than stated age Head: Normocephalic, without obvious abnormality, atraumatic Eyes: negative Neck: no adenopathy, no carotid bruit, no JVD and supple, symmetrical, trachea midline Lungs: clear to auscultation bilaterally, healed median sternotomy scar Heart: regular rate and rhythm, S1, S2 normal, no murmur, click, rub or gallop Abdomen: soft, non-tender; bowel sounds normal; no masses,  no organomegaly Rectal: deferred Extremities: extremities normal, atraumatic, no cyanosis or edema Pulses: 2+ and symmetric Skin: Skin color, texture, turgor normal. No rashes or lesions Neurologic: Grossly normal  Labs: CBC  Recent Labs  02/25/15 0600  WBC 10.6*  RBC 4.41  HGB 12.6*  HCT 37.7*  PLT 112*  MCV 85.5  MCH 28.6  MCHC 33.4  RDW 14.8   CMP   Recent Labs   02/25/15 0600  NA 143  K 4.0  CL 113*  CO2 24  GLUCOSE 119*  BUN 17  CREATININE 1.14  CALCIUM 8.7*  PROT 6.1*  ALBUMIN 3.4*  AST 16  ALT 15*  ALKPHOS 61  BILITOT 0.6  GFRNONAA 56*  GFRAA >60   Cardiac Panel (last 3 results)  Recent Labs  02/25/15 0150 02/25/15 0600  TROPONINI <0.03 <0.03     Radiology: No active cardiopulmonary disease  EKG: EKG on admission shows sinus rhythm, right bundle branch block, baseline wander, repeat EKG this morning shows no ischemic ST change  Signed:  W. Doristine Church MD [facility]   Cardiology Consultant  02/25/2015, 9:03 AM

## 2015-02-25 NOTE — Progress Notes (Signed)
PT Cancellation Note  Patient Details Name: [name] MRN: [id number] DOB: 05-18-1927   Cancelled Treatment:    Reason Eval/Treat Not Completed: Medical issues which prohibited therapy.  Pt is being treated for sudden chest pain that has EKG change.  May be clear to see later today.   Ramond Dial 02/25/2015, 11:13 AM   Mee Hives, PT MS Acute Rehab Dept. Number: [facility] [id number] and [place] [phone]

## 2015-02-26 ENCOUNTER — Ambulatory Visit (HOSPITAL_COMMUNITY): Payer: Medicare Other

## 2015-02-26 DIAGNOSIS — I25708 Atherosclerosis of coronary artery bypass graft(s), unspecified, with other forms of angina pectoris: Secondary | ICD-10-CM

## 2015-02-26 DIAGNOSIS — I10 Essential (primary) hypertension: Secondary | ICD-10-CM | POA: Diagnosis not present

## 2015-02-26 DIAGNOSIS — I209 Angina pectoris, unspecified: Secondary | ICD-10-CM

## 2015-02-26 DIAGNOSIS — E785 Hyperlipidemia, unspecified: Secondary | ICD-10-CM | POA: Diagnosis not present

## 2015-02-26 DIAGNOSIS — M4806 Spinal stenosis, lumbar region: Secondary | ICD-10-CM | POA: Diagnosis not present

## 2015-02-26 DIAGNOSIS — R072 Precordial pain: Secondary | ICD-10-CM | POA: Diagnosis not present

## 2015-02-26 LAB — BASIC METABOLIC PANEL
ANION GAP: 5 (ref 5–15)
BUN: 15 mg/dL (ref 6–20)
CHLORIDE: 112 mmol/L — AB (ref 101–111)
CO2: 27 mmol/L (ref 22–32)
Calcium: 8.2 mg/dL — ABNORMAL LOW (ref 8.9–10.3)
Creatinine, Ser: 1.23 mg/dL (ref 0.61–1.24)
GFR calc non Af Amer: 51 mL/min — ABNORMAL LOW (ref >60)
GFR, EST AFRICAN AMERICAN: 59 mL/min — AB (ref >60)
Glucose, Bld: 87 mg/dL (ref 65–99)
POTASSIUM: 4.2 mmol/L (ref 3.5–5.1)
Sodium: 144 mmol/L (ref 135–145)

## 2015-02-26 LAB — CBC
HCT: 35.2 % — ABNORMAL LOW (ref 39.0–52.0)
HEMOGLOBIN: 11.4 g/dL — AB (ref 13.0–17.0)
MCH: 27.9 pg (ref 26.0–34.0)
MCHC: 32.4 g/dL (ref 30.0–36.0)
MCV: 86.3 fL (ref 78.0–100.0)
Platelets: 101 10*3/uL — ABNORMAL LOW (ref 150–400)
RBC: 4.08 MIL/uL — AB (ref 4.22–5.81)
RDW: 15.1 % (ref 11.5–15.5)
WBC: 6.6 10*3/uL (ref 4.0–10.5)

## 2015-02-26 LAB — GLUCOSE, CAPILLARY: Glucose-Capillary: 81 mg/dL (ref 65–99)

## 2015-02-26 LAB — TROPONIN I
Troponin I: 0.05 ng/mL — ABNORMAL HIGH (ref <0.031)
Troponin I: 0.04 ng/mL — ABNORMAL HIGH (ref <0.031)

## 2015-02-26 LAB — HEMOGLOBIN A1C
HEMOGLOBIN A1C: 5.6 % (ref 4.8–5.6)
MEAN PLASMA GLUCOSE: 114 mg/dL

## 2015-02-26 NOTE — Evaluation (Signed)
Physical Therapy Evaluation Patient Details Name: [name] MRN: [id number] DOB: [birth year] Today's Date: 02/26/2015   History of Present Illness  81 yo male admitted to [facility] on 02/24/15 for chest pain.  He is being followd by cardiology.  Pt with significant PMHx of angina, CABG, CAD, memory difficulties, HOH, lumbar laminectomy, and L THA.    Clinical Impression  Pt is independent with all mobility and VSS throughout.  No reports of chest pain even when doing more strenuous activity (flight of stairs).  Pt has no acute or f/u PT needs at this time.     Follow Up Recommendations No PT follow up    Equipment Recommendations  None recommended by PT    Recommendations for Other Services   NA    Precautions / Restrictions Precautions Precautions: None      Mobility  Bed Mobility Overal bed mobility: Independent                Transfers Overall transfer level: Independent                  Ambulation/Gait Ambulation/Gait assistance: Modified independent (Device/Increase time) Ambulation Distance (Feet): 400 Feet Assistive device: Rolling walker (2 wheeled) Gait Pattern/deviations: Step-through pattern;Trunk flexed   Gait velocity interpretation: at or above normal speed for age/gender General Gait Details: Pt reports if he flexes his trunk slightly (~15 degrees forward) while walking it relieves his hip pain.   Stairs Stairs: Yes Stairs assistance: Modified independent (Device/Increase time) Stair Management: One rail Left;Alternating pattern;Forwards Number of Stairs: 9 General stair comments: Pt reports left hip pain with stairs, but this is normal for him.          Balance Overall balance assessment: No apparent balance deficits (not formally assessed)                                           Pertinent Vitals/Pain Pain Assessment: Faces Faces Pain Scale: Hurts little more Pain Location: left hip while doing stairs. No pain  in chest during activity Pain Intervention(s): Limited activity within patient's tolerance;Monitored during session;Repositioned    Home Living Family/patient expects to be discharged to:: Private residence Living Arrangements: Spouse/significant other Available Help at Discharge: Family Type of Home: House Home Access: Stairs to enter Entrance Stairs-Rails: Right Entrance Stairs-Number of Steps: 3 Home Layout: Two level Home Equipment: Environmental consultant - 2 wheels;Shower seat;Cane - single point;Hand held shower head;Grab bars - toilet;Grab bars - tub/shower Additional Comments: Pt reports level entry into den and 3 steps with R rail up to rest of house.    Prior Function Level of Independence: Independent         Comments: driving, mowing the yard, had to stop playing golf due to back pain ~1 year ago.     Hand Dominance   Dominant Hand: Right    Extremity/Trunk Assessment   Upper Extremity Assessment: Defer to OT evaluation           Lower Extremity Assessment: LLE deficits/detail   LLE Deficits / Details: left leg with chronic pain likely lumbar origin, pt reports he has recently finished PT and has had a series of injections.   Cervical / Trunk Assessment: Other exceptions  Communication   Communication: HOH  Cognition Arousal/Alertness: Awake/alert Behavior During Therapy: WFL for tasks assessed/performed Overall Cognitive Status: History of cognitive impairments - at baseline (self reported memory  deficits)       Memory: Decreased short-term memory              General Comments General comments (skin integrity, edema, etc.): HR stable with activity an no reports of chest pain.           Assessment/Plan    PT Assessment Patent does not need any further PT services  PT Diagnosis Acute pain         PT Goals (Current goals can be found in the Care Plan section) Acute Rehab PT Goals Patient Stated Goal: to go home, and he would really like for his back  to be better so he could get back to playing golf.                End of Session   Activity Tolerance: Patient tolerated treatment well Patient left: in bed;with call bell/phone within reach;with family/visitor present Nurse Communication: Mobility status    Functional Assessment Tool Used: assist level Functional Limitation: Mobility: Walking and moving around Mobility: Walking and Moving Around Current Status [phone]): 0 percent impaired, limited or restricted Mobility: Walking and Moving Around Goal Status [phone]): 0 percent impaired, limited or restricted Mobility: Walking and Moving Around Discharge Status [phone]): 0 percent impaired, limited or restricted    Time: 1015-1041 (no charge for extra chit-chatting) PT Time Calculation (min) (ACUTE ONLY): 26 min   Charges:   PT Evaluation $Initial PT Evaluation Tier I: 1 Procedure     PT G Codes:   PT G-Codes **NOT FOR INPATIENT CLASS** Functional Assessment Tool Used: assist level Functional Limitation: Mobility: Walking and moving around Mobility: Walking and Moving Around Current Status (T3428): 0 percent impaired, limited or restricted Mobility: Walking and Moving Around Goal Status (J6811): 0 percent impaired, limited or restricted Mobility: Walking and Moving Around Discharge Status (X7262): 0 percent impaired, limited or restricted    Deretha Ertle B. Larrisha Babineau, PT, DPT [phone]   02/26/2015, 10:51 AM

## 2015-02-26 NOTE — Discharge Summary (Addendum)
Physician Discharge Summary  [name] VQM:[id number] DOB: Apr 07, 1927 DOA: 02/24/2015  PCP: Precious Reel, MD  Admit date: 02/24/2015 Discharge date: 02/26/2015  Recommendations for Outpatient Follow-up:  1. F/u with cardiology in 1 month or sooner if chest pain becomes recurrent.  F/u ECHO report. 2. Home medications were resumed 3. Ongoing pain management by Dr. Nelva Bush.   4. PCP for work up of thrombocytopenia and anemia if not already complete.    Discharge Diagnoses:  Principal Problem:   Angina pectoris Active Problems:   CAD (coronary artery disease)   S/P CABG (coronary artery bypass graft)   Hyperlipidemia   History of prostate cancer   Thrombocytopenia   Spinal stenosis of lumbar region at multiple levels   Osteoarthritis of left hip   Essential hypertension   Discharge Condition: stable, improved  Diet recommendation: healthy heart  Wt Readings from Last 3 Encounters:  02/26/15 76.3 kg (168 lb 3.4 oz)  09/04/14 78.472 kg (173 lb)  08/28/14 78.472 kg (173 lb)    History of present illness:   [name] is a 81 y.o. male with PMH of hypertension, hyperlipidemia, anxiety, prostate cancer, arthritis, CAD, s/p of stent 2013, and s/p of CABG 2000 and 2007, who presented with bilateral shoulder area chest pain which progressed to  Involve the front chest.  It was constant, severe sharp pain that improved with aspirin and 3 doses of nitroglycerin. Of note, because of back pain, he received local injection by his orthopedic surgeon 2 weeks ago and again two days prior to admission, and he had been advised to stop his heart medications for that time.  He stopped not just his aspirin and plavix but ALL of his medications except for nitroglycerin for the last two weeks. In the ER, he had a negative troponin, chest x-ray for acute abnormalities, and no ischemic changes on ECG.  Hospital Course:    Chest pain in setting of known CAD:  He has had two CABG procedures and  several stents with difficult anatomy according to cardiology.  They are trying to manage his disease medically at this point.  He had no obvious explanation for his pain on CXR.  Because he had held ALL of his antianginal medications including his aspirin, plavix, beta blocker, CCB, nitrate, and ranolazine for two weeks for spinal injections, he probably developed increased symptoms from what was previously well-managed stable angina. He was seen by cardiology.  His troponins trended up slightly from less than threshold to 0.05 where they plateaued.  His elevation of troponin was unlikely to be related to ACS and probably reflected known chronic CAD with strain from not taking his medications.  His home medications were resumed, including his BB, imdur, ranolazine, aspirin, and plavix.  He had an episode of chest pain early in his hospitalization which resolved quickly with one aspirin, however, it did not recur after resuming his medications and he was able to ambulate without problem during the remainder of his stay.  He did have some mild asymptomatic sinus bradycardia and he continued his beta blocker.  He should follow up with his cardiologist in about 1 month or sooner as needed.  ECHO report is pending at the time of discharge.    Spinal stenosis and DDD and arthritis in lumbar region at multiple levels: received local injection by ortho twice without improvement.  He continues to have severe pain and was advised to follow up with Dr. Nelva Bush at his next already scheduled appointment.  Essential  hypertension:  He resumed metoprolol and amlodipine  HLD, He is intolerant to statin and zetia.    Chronic kidney disease stage III, creatinine remained stable between 1.14 and 1.37.  Initially, he was thought to have AKI, but more likely, his creatinine is chronically elevated in this range.    Mild anemia and thrombocytopenia which are chronic.  Defer to primary care doctor for work up if not already  complete.     Consultants:  Cardiology  Procedures:  CXR  Antibiotics:  none  Discharge Exam: Filed Vitals:   02/26/15 1043  BP:   Pulse: 79  Temp:   Resp:    Filed Vitals:   02/25/15 2032 02/26/15 0455 02/26/15 0738 02/26/15 1043  BP: 111/55 129/54 119/63   Pulse: 63 51  79  Temp: 98.2 F (36.8 C) 97.9 F (36.6 C)    TempSrc: Oral Oral    Resp:      Height:      Weight:  76.3 kg (168 lb 3.4 oz)    SpO2: 97% 98% 98%      General: Adult male, No acute distress, walking around halls easily with rolling walker  HEENT: NCAT, MMM  Cardiovascular: RRR, nl S1, S2 no mrg, 2+ pulses, warm extremities  Respiratory: CTAB, no increased WOB  Abdomen: NABS, soft, NT/ND  MSK: Normal tone and bulk, no LEE  Neuro: Grossly intact  Discharge Instructions      Discharge Instructions    Call MD for:  difficulty breathing, headache or visual disturbances    Complete by:  As directed      Call MD for:  extreme fatigue    Complete by:  As directed      Call MD for:  hives    Complete by:  As directed      Call MD for:  persistant dizziness or light-headedness    Complete by:  As directed      Call MD for:  persistant nausea and vomiting    Complete by:  As directed      Call MD for:  severe uncontrolled pain    Complete by:  As directed      Call MD for:  temperature >100.4    Complete by:  As directed      Diet - low sodium heart healthy    Complete by:  As directed      Discharge instructions    Complete by:  As directed   You were hospitalized with chest pain which was probably angina or heart-related pain caused by not being able to take your heart medications for the last few weeks.  Please resume all of your medications as before.  Stay active.  You should follow up with Dr. Wynonia Lawman or Dr. Tamala Julian in about a month to make sure you are still doing okay.  I would try to avoid procedures that require stopping your heart medications.  In particular, for most  procedures, you should be able to continue all of your normal heart medications EXCEPT for aspirin and plavix.  If you continue to have recurrent severe chest pains that are more severe and more frequent than before, please call the cardiology office right away or come to the emergency department.  Most likely, your chest pains will continue to improve over the next few days after restarting your medications.     Increase activity slowly    Complete by:  As directed  Medication List    STOP taking these medications        docusate sodium 100 MG capsule  Commonly known as:  COLACE     HYDROcodone-acetaminophen 5-325 MG per tablet  Commonly known as:  NORCO/VICODIN     methocarbamol 500 MG tablet  Commonly known as:  ROBAXIN     ondansetron 4 MG tablet  Commonly known as:  ZOFRAN     senna 8.6 MG Tabs tablet  Commonly known as:  SENOKOT      TAKE these medications        amLODipine 5 MG tablet  Commonly known as:  NORVASC  Take 5 mg by mouth every morning.     aspirin EC 325 MG tablet  Take 1 tablet (325 mg total) by mouth 2 (two) times daily after a meal.     bimatoprost 0.03 % ophthalmic solution  Commonly known as:  LUMIGAN  Place 1 drop into both eyes at bedtime.     cholecalciferol 1000 UNITS tablet  Commonly known as:  VITAMIN D  Take 1,000 Units by mouth every morning.     clopidogrel 75 MG tablet  Commonly known as:  PLAVIX  Take 75 mg by mouth every morning.     GARLIC PO  Take 1 tablet by mouth every morning.     isosorbide mononitrate 120 MG 24 hr tablet  Commonly known as:  IMDUR  Take 120 mg by mouth daily with lunch.     ISTALOL 0.5 % (DAILY) Soln  Generic drug:  Timolol Maleate  Place 1 drop into both eyes every morning.     Memantine HCl ER 21 MG Cp24  Take 1 capsule by mouth daily.     metoprolol 50 MG tablet  Commonly known as:  LOPRESSOR  Take 75 mg by mouth 2 (two) times daily.     nitroGLYCERIN 0.4 MG SL tablet   Commonly known as:  NITROSTAT  Place 0.4 mg under the tongue every 5 (five) minutes as needed for chest pain.     OVER THE COUNTER MEDICATION  Take 1 capsule by mouth every morning. Mega Red Omega-3 Krill Oil     RANEXA 1000 MG SR tablet  Generic drug:  ranolazine  Take 500 mg by mouth 2 (two) times daily.       Follow-up Information    Follow up with Precious Reel, MD. Schedule an appointment as soon as possible for a visit in 2 weeks.   Specialty:  Internal Medicine   Contact information:   [address] [place] [address] [phone]       Follow up with Ezzard Standing, MD. Schedule an appointment as soon as possible for a visit in 1 month.   Specialty:  Cardiology   Contact information:   [address] [place] [address] [phone]       Schedule an appointment as soon as possible for a visit with RAMOS,RICHARD D, MD.   Specialty:  Physical Medicine and Rehabilitation   Why:  As needed   Contact information:   [address] [place] [address] [place] [address] [phone]        The results of significant diagnostics from this hospitalization (including imaging, microbiology, ancillary and laboratory) are listed below for reference.    Significant Diagnostic Studies: Dg Chest 2 View  02/24/2015   CLINICAL DATA:  Chest, back and bilateral shoulder pain.  EXAM: CHEST  2 VIEW  COMPARISON:  None.  FINDINGS:  Previous median sternotomy and CABG procedure. The heart size and mediastinal contours are within normal limits. Both lungs are clear. Scoliosis and degenerative disc disease noted within the thoracic spine.  IMPRESSION: No active cardiopulmonary disease.   Electronically Signed   By: Kerby Moors M.D.   On: 02/24/2015 21:45    Microbiology: No results found for this or any previous visit (from the past 240 hour(s)).   Labs: Basic Metabolic Panel:  Recent Labs Lab 02/24/15 0017 02/24/15 2317 02/25/15 0150 02/25/15 0600  02/26/15 0540  NA 142 140  --  143 144  K 4.1 6.8* 4.0 4.0 4.2  CL 109 109  --  113* 112*  CO2 23  --   --  24 27  GLUCOSE 159* 189*  --  119* 87  BUN 20 33*  --  17 15  CREATININE 1.37* 1.20  --  1.14 1.23  CALCIUM 8.9  --   --  8.7* 8.2*   Liver Function Tests:  Recent Labs Lab 02/25/15 0600  AST 16  ALT 15*  ALKPHOS 61  BILITOT 0.6  PROT 6.1*  ALBUMIN 3.4*   No results for input(s): LIPASE, AMYLASE in the last 168 hours. No results for input(s): AMMONIA in the last 168 hours. CBC:  Recent Labs Lab 02/24/15 2203 02/24/15 2317 02/25/15 0600 02/26/15 0540  WBC 8.6  --  10.6* 6.6  HGB 12.7* 13.3 12.6* 11.4*  HCT 38.4* 39.0 37.7* 35.2*  MCV 85.1  --  85.5 86.3  PLT 134*  --  112* 101*   Cardiac Enzymes:  Recent Labs Lab 02/25/15 0600 02/25/15 1342 02/25/15 1830 02/26/15 0004 02/26/15 0540  TROPONINI <0.03 0.05* 0.05* 0.05* 0.04*   BNP: BNP (last 3 results) No results for input(s): BNP in the last 8760 hours.  ProBNP (last 3 results) No results for input(s): PROBNP in the last 8760 hours.  CBG:  Recent Labs Lab 02/25/15 0726 02/25/15 1115 02/25/15 1628 02/25/15 2034 02/26/15 0733  GLUCAP 99 154* 156* 115* 81    Time coordinating discharge: 35 minutes  Signed:  Patton Rabinovich  [facility] 02/26/2015, 12:48 PM

## 2015-02-26 NOTE — Progress Notes (Signed)
OT EVALUATION (late entry due to epic down time)   02/25/15 1400  OT Visit Information  Last OT Received On 02/25/15  Assistance Needed +1  History of Present Illness 81 yo male admitted with CP  Precautions  Precautions None  Home Living  Family/patient expects to be discharged to: Private residence  Living Arrangements Spouse/significant other  Available Help at Discharge Family  Type of [place] to enter  Entrance Stairs-Number of Steps 3  Entrance Stairs-Rails Right  Home Layout Two level  Alternate Level Stairs-Number of Steps 3  Alternate Level Stairs-Rails Right  Bathroom Shower/Tub Walk-in shower  [place] - 2 wheels;Shower seat;Cane - single point;Hand held shower head;Grab bars - toilet;Grab bars - tub/shower  Additional Comments Pt reports level entry into den and 3 steps with R rail up to rest of house.  Prior Function  Level of Independence Independent  Comments driving, mowing the yard  Communication  Communication HOH (wears hearing aides but currently not present at [facility])  Pain Assessment  Pain Assessment 0-10  Pain Score 8  Pain Location L LE with weight bearing only  Pain Descriptors / Indicators Shooting  Pain Intervention(s) Repositioned  Cognition  Arousal/Alertness Awake/alert  Behavior During Therapy WFL for tasks assessed/performed  Overall Cognitive Status Within Functional Limits for tasks assessed  Upper Extremity Assessment  Upper Extremity Assessment Overall WFL for tasks assessed  Lower Extremity Assessment  Lower Extremity Assessment LLE deficits/detail  LLE Deficits / Details radiating pain - has received shot 24 hours ago due to pain. Previous back and hip surg  Cervical / Trunk Assessment  Cervical / Trunk Assessment Other exceptions (previous surg)  ADL  Overall ADL's  At baseline  Vision- History  Baseline Vision/History Wears glasses  Wears Glasses At all  times (normally only wears them currently for reading)  Patient Visual Report No change from baseline  Vision- Assessment  Vision Assessment? No apparent visual deficits  Bed Mobility  Overal bed mobility Independent  Transfers  Overall transfer level Independent  OT - End of Session  Activity Tolerance Patient tolerated treatment well  Patient left in bed;with call bell/phone within reach;with family/visitor present  Nurse Communication Mobility status;Precautions  OT Assessment  OT Therapy Diagnosis  Acute pain  OT Recommendation/Assessment Patient does not need any further OT services  OT Recommendation  Follow Up Recommendations No OT follow up  OT Equipment None recommended by OT  OT Time Calculation  OT Start Time (ACUTE ONLY) 1430  OT Stop Time (ACUTE ONLY) 1515  OT Time Calculation (min) 45 min  OT G-codes **NOT FOR INPATIENT CLASS**  Functional Assessment Tool Used clinical judgement  Functional Limitation Self care  Self Care Current Status (Q8250) CI  Self Care Goal Status (I3704) CI  Self Care Discharge Status (U8891) CI  OT General Charges  $OT Visit 1 Procedure  OT Evaluation  $Initial OT Evaluation Tier I 1 Procedure  OT Treatments  $Self Care/Home Management  23-37 mins  Written Expression  Dominant Hand Right    Jeri Modena   OTR/L Pager: [phone] Office: [phone] .

## 2015-02-26 NOTE — Progress Notes (Signed)
  Echocardiogram 2D Echocardiogram has been performed.  Donata Clay 02/26/2015, 12:04 PM

## 2015-02-26 NOTE — Progress Notes (Signed)
Subjective:  After I saw him yesterday morning he had an episode of angina.  There were some delay in responding to it and in giving him nitroglycerin from the floor and by the time he was given he was having severe discomfort but then this went away.  There were no EKG changes noted.  Troponins were drawn and there is very trivial elevation just outside of normal that may not even be significant.  He has felt well since then and has been ambulatory in the hall without recurrent angina.  Objective:  Vital Signs in the last 24 hours: BP 119/63 mmHg  Pulse 51  Temp(Src) 97.9 F (36.6 C) (Oral)  Resp 15  Ht '5\' 7"'$  (1.702 m)  Wt 76.3 kg (168 lb 3.4 oz)  BMI 26.34 kg/m2  SpO2 98%  Physical Exam: Pleasant elderly male in no acute distress Lungs:  Clear Cardiac:  Regular rhythm, normal S1 and S2, no S3 Extremities:  No edema present  Intake/Output from previous day: 08/24 0701 - 08/25 0700 In: -  Out: 975 [Urine:975]  Weight Filed Weights   02/24/15 2115 02/25/15 0047 02/26/15 0455  Weight: 77.111 kg (170 lb) 74.2 kg (163 lb 9.3 oz) 76.3 kg (168 lb 3.4 oz)   Lab Results: Basic Metabolic Panel:  Recent Labs  02/25/15 0600 02/26/15 0540  NA 143 144  K 4.0 4.2  CL 113* 112*  CO2 24 27  GLUCOSE 119* 87  BUN 17 15  CREATININE 1.14 1.23   CBC:  Recent Labs  02/25/15 0600 02/26/15 0540  WBC 10.6* 6.6  HGB 12.6* 11.4*  HCT 37.7* 35.2*  MCV 85.5 86.3  PLT 112* 101*   Cardiac Enzymes: Troponin (Point of Care Test)  Recent Labs  02/24/15 2215  TROPIPOC 0.00   Cardiac Panel (last 3 results)  Recent Labs  02/25/15 1830 02/26/15 0004 02/26/15 0540  TROPONINI 0.05* 0.05* 0.04*    Telemetry: Sinus rhythm  Assessment/Plan:  1.  Angina pectoris in a patient with known coronary artery disease and previous intervention who is not felt to be a good candidate for repeat intervention 2.  Recent lumbar injections with cessation of anti-platelet  agents  Recommendations:  No recurrent angina now for 24 hours.  I would resume his home medications and delay the spinal injection that is scheduled unless he has significant relief from the second one.  If he is ambulatory in the hall this morning without recurrent angina I think that he could go home this afternoon.     Kerry Hough  MD [facility]  02/26/2015, 8:57 AM

## 2015-02-26 NOTE — Progress Notes (Signed)
Pt Hr 52 at 8am. Rechecked at 10am and HR in 50s-60s at rest but HR 70 when ambulating. Cardiology was called to see if metoprolol needed to be held and dosage adjusted before pt going home. Cardiology said to give med and that no changes to home dose of metoprolol was needed. HR check again at 12pm and in HR ranging from 48-61. Pt asymptomatic and walked two laps with RN. MD was notified.

## 2015-05-28 ENCOUNTER — Encounter (HOSPITAL_COMMUNITY): Payer: Self-pay | Admitting: *Deleted

## 2015-05-28 ENCOUNTER — Emergency Department (HOSPITAL_COMMUNITY): Payer: Medicare Other

## 2015-05-28 ENCOUNTER — Observation Stay (HOSPITAL_COMMUNITY)
Admission: EM | Admit: 2015-05-28 | Discharge: 2015-05-29 | Disposition: A | Discharge status: Home / Self Care | Payer: Medicare Other | Attending: Cardiovascular Disease | Admitting: Cardiovascular Disease

## 2015-05-28 DIAGNOSIS — Z79899 Other long term (current) drug therapy: Secondary | ICD-10-CM | POA: Diagnosis not present

## 2015-05-28 DIAGNOSIS — D375 Neoplasm of uncertain behavior of rectum: Secondary | ICD-10-CM | POA: Diagnosis not present

## 2015-05-28 DIAGNOSIS — F419 Anxiety disorder, unspecified: Secondary | ICD-10-CM | POA: Diagnosis not present

## 2015-05-28 DIAGNOSIS — E875 Hyperkalemia: Secondary | ICD-10-CM | POA: Diagnosis present

## 2015-05-28 DIAGNOSIS — E785 Hyperlipidemia, unspecified: Secondary | ICD-10-CM | POA: Diagnosis not present

## 2015-05-28 DIAGNOSIS — Z7902 Long term (current) use of antithrombotics/antiplatelets: Secondary | ICD-10-CM | POA: Insufficient documentation

## 2015-05-28 DIAGNOSIS — Z951 Presence of aortocoronary bypass graft: Secondary | ICD-10-CM | POA: Insufficient documentation

## 2015-05-28 DIAGNOSIS — Z7982 Long term (current) use of aspirin: Secondary | ICD-10-CM | POA: Diagnosis not present

## 2015-05-28 DIAGNOSIS — I209 Angina pectoris, unspecified: Secondary | ICD-10-CM | POA: Diagnosis present

## 2015-05-28 DIAGNOSIS — R079 Chest pain, unspecified: Secondary | ICD-10-CM | POA: Diagnosis present

## 2015-05-28 DIAGNOSIS — I25119 Atherosclerotic heart disease of native coronary artery with unspecified angina pectoris: Secondary | ICD-10-CM | POA: Diagnosis not present

## 2015-05-28 DIAGNOSIS — I10 Essential (primary) hypertension: Secondary | ICD-10-CM | POA: Diagnosis present

## 2015-05-28 DIAGNOSIS — Z87828 Personal history of other (healed) physical injury and trauma: Secondary | ICD-10-CM | POA: Insufficient documentation

## 2015-05-28 DIAGNOSIS — H9190 Unspecified hearing loss, unspecified ear: Secondary | ICD-10-CM | POA: Insufficient documentation

## 2015-05-28 DIAGNOSIS — M1990 Unspecified osteoarthritis, unspecified site: Secondary | ICD-10-CM | POA: Diagnosis not present

## 2015-05-28 DIAGNOSIS — I25110 Atherosclerotic heart disease of native coronary artery with unstable angina pectoris: Secondary | ICD-10-CM | POA: Insufficient documentation

## 2015-05-28 DIAGNOSIS — Z8546 Personal history of malignant neoplasm of prostate: Secondary | ICD-10-CM | POA: Insufficient documentation

## 2015-05-28 DIAGNOSIS — Z955 Presence of coronary angioplasty implant and graft: Secondary | ICD-10-CM | POA: Insufficient documentation

## 2015-05-28 DIAGNOSIS — I200 Unstable angina: Secondary | ICD-10-CM | POA: Diagnosis present

## 2015-05-28 DIAGNOSIS — I25810 Atherosclerosis of coronary artery bypass graft(s) without angina pectoris: Secondary | ICD-10-CM | POA: Insufficient documentation

## 2015-05-28 HISTORY — DX: Other anterior subluxation of left hip, initial encounter: S73.032A

## 2015-05-28 LAB — COMPREHENSIVE METABOLIC PANEL
ALK PHOS: 63 U/L (ref 38–126)
ALT: 14 U/L — AB (ref 17–63)
AST: 36 U/L (ref 15–41)
Albumin: 3.5 g/dL (ref 3.5–5.0)
Anion gap: 8 (ref 5–15)
BUN: 17 mg/dL (ref 6–20)
CALCIUM: 8.8 mg/dL — AB (ref 8.9–10.3)
CO2: 21 mmol/L — AB (ref 22–32)
CREATININE: 1.36 mg/dL — AB (ref 0.61–1.24)
Chloride: 110 mmol/L (ref 101–111)
GFR calc non Af Amer: 45 mL/min — ABNORMAL LOW (ref >60)
GFR, EST AFRICAN AMERICAN: 53 mL/min — AB (ref >60)
Glucose, Bld: 194 mg/dL — ABNORMAL HIGH (ref 65–99)
Potassium: 5.3 mmol/L — ABNORMAL HIGH (ref 3.5–5.1)
SODIUM: 139 mmol/L (ref 135–145)
Total Bilirubin: 0.9 mg/dL (ref 0.3–1.2)
Total Protein: 5.3 g/dL — ABNORMAL LOW (ref 6.5–8.1)

## 2015-05-28 LAB — TROPONIN I: Troponin I: <0.03 ng/mL (ref <0.031)

## 2015-05-28 LAB — CBC
HEMATOCRIT: 35.4 % — AB (ref 39.0–52.0)
HEMOGLOBIN: 11.2 g/dL — AB (ref 13.0–17.0)
MCH: 26.7 pg (ref 26.0–34.0)
MCHC: 31.6 g/dL (ref 30.0–36.0)
MCV: 84.5 fL (ref 78.0–100.0)
Platelets: 110 10*3/uL — ABNORMAL LOW (ref 150–400)
RBC: 4.19 MIL/uL — AB (ref 4.22–5.81)
RDW: 15.6 % — ABNORMAL HIGH (ref 11.5–15.5)
WBC: 11.9 10*3/uL — ABNORMAL HIGH (ref 4.0–10.5)

## 2015-05-28 LAB — APTT: aPTT: 28 seconds (ref 24–37)

## 2015-05-28 LAB — I-STAT TROPONIN, ED: Troponin i, poc: 0 ng/mL (ref 0.00–0.08)

## 2015-05-28 LAB — PROTIME-INR
INR: 1.43 (ref 0.00–1.49)
Prothrombin Time: 17.5 seconds — ABNORMAL HIGH (ref 11.6–15.2)

## 2015-05-28 LAB — PLATELET INHIBITION P2Y12: Platelet Function  P2Y12: 315 [PRU] (ref 194–418)

## 2015-05-28 MED ORDER — TIMOLOL MALEATE 0.5 % OP SOLG
1.0000 [drp] | Freq: Every morning | OPHTHALMIC | Status: DC
  Filled 2015-05-28: qty 5

## 2015-05-28 MED ORDER — FINASTERIDE 5 MG PO TABS
5.0000 mg | ORAL_TABLET | Freq: Every day | ORAL | Status: DC
  Administered 2015-05-29: 5 mg via ORAL
  Filled 2015-05-28: qty 1

## 2015-05-28 MED ORDER — SODIUM CHLORIDE 0.9 % IV SOLN
20.0000 mL | INTRAVENOUS | Status: DC
  Administered 2015-05-28: 20 mL via INTRAVENOUS

## 2015-05-28 MED ORDER — ASPIRIN EC 325 MG PO TBEC
325.0000 mg | DELAYED_RELEASE_TABLET | Freq: Every day | ORAL | Status: DC
  Administered 2015-05-29: 325 mg via ORAL
  Filled 2015-05-28: qty 1

## 2015-05-28 MED ORDER — LATANOPROST 0.005 % OP SOLN
1.0000 [drp] | Freq: Every day | OPHTHALMIC | Status: DC
  Administered 2015-05-28: 1 [drp] via OPHTHALMIC
  Filled 2015-05-28: qty 2.5

## 2015-05-28 MED ORDER — GABAPENTIN 300 MG PO CAPS
300.0000 mg | ORAL_CAPSULE | Freq: Every day | ORAL | Status: DC
  Administered 2015-05-28: 300 mg via ORAL
  Filled 2015-05-28: qty 1

## 2015-05-28 MED ORDER — ENOXAPARIN SODIUM 40 MG/0.4ML ~~LOC~~ SOLN
40.0000 mg | SUBCUTANEOUS | Status: DC
  Administered 2015-05-28: 40 mg via SUBCUTANEOUS
  Filled 2015-05-28: qty 0.4

## 2015-05-28 MED ORDER — ISOSORBIDE MONONITRATE ER 60 MG PO TB24
120.0000 mg | ORAL_TABLET | Freq: Every day | ORAL | Status: DC
  Administered 2015-05-29: 120 mg via ORAL
  Filled 2015-05-28: qty 2

## 2015-05-28 MED ORDER — ALFUZOSIN HCL ER 10 MG PO TB24
10.0000 mg | ORAL_TABLET | Freq: Every day | ORAL | Status: DC
  Administered 2015-05-28: 10 mg via ORAL
  Filled 2015-05-28 (×2): qty 1

## 2015-05-28 MED ORDER — ACETAMINOPHEN 325 MG PO TABS: 650.0000 mg | ORAL_TABLET | ORAL | Status: DC | PRN

## 2015-05-28 MED ORDER — MEMANTINE HCL ER 28 MG PO CP24
28.0000 mg | ORAL_CAPSULE | Freq: Every day | ORAL | Status: DC
  Administered 2015-05-29: 28 mg via ORAL
  Filled 2015-05-28: qty 1

## 2015-05-28 MED ORDER — CLOPIDOGREL BISULFATE 75 MG PO TABS
75.0000 mg | ORAL_TABLET | Freq: Every morning | ORAL | Status: DC
  Administered 2015-05-29: 75 mg via ORAL
  Filled 2015-05-28: qty 1

## 2015-05-28 MED ORDER — RANOLAZINE ER 500 MG PO TB12
500.0000 mg | ORAL_TABLET | Freq: Two times a day (BID) | ORAL | Status: DC
  Administered 2015-05-28 – 2015-05-29 (×2): 500 mg via ORAL
  Filled 2015-05-28 (×2): qty 1

## 2015-05-28 MED ORDER — METOPROLOL TARTRATE 50 MG PO TABS
75.0000 mg | ORAL_TABLET | Freq: Two times a day (BID) | ORAL | Status: DC
  Administered 2015-05-28 – 2015-05-29 (×2): 75 mg via ORAL
  Filled 2015-05-28 (×2): qty 1

## 2015-05-28 MED ORDER — ONDANSETRON HCL 4 MG/2ML IJ SOLN: 4.0000 mg | Freq: Four times a day (QID) | INTRAMUSCULAR | Status: DC | PRN

## 2015-05-28 MED ORDER — VITAMIN D 1000 UNITS PO TABS
1000.0000 [IU] | ORAL_TABLET | Freq: Every morning | ORAL | Status: DC
  Administered 2015-05-29: 1000 [IU] via ORAL
  Filled 2015-05-28: qty 1

## 2015-05-28 MED ORDER — ZOLPIDEM TARTRATE 5 MG PO TABS: 5.0000 mg | ORAL_TABLET | Freq: Every evening | ORAL | Status: DC | PRN

## 2015-05-28 MED ORDER — AMLODIPINE BESYLATE 5 MG PO TABS
5.0000 mg | ORAL_TABLET | Freq: Every morning | ORAL | Status: DC
  Administered 2015-05-29: 5 mg via ORAL
  Filled 2015-05-28: qty 1

## 2015-05-28 MED ORDER — NITROGLYCERIN 0.4 MG SL SUBL
0.4000 mg | SUBLINGUAL_TABLET | SUBLINGUAL | Status: DC | PRN
  Administered 2015-05-28 (×2): 0.4 mg via SUBLINGUAL
  Filled 2015-05-28: qty 1

## 2015-05-28 NOTE — ED Notes (Signed)
Pt presents via [facility] from home with c/o chest pain beginning around 0100AM.  Pt took Nitro at home and pain was relieved.  Pt continued to have CP, EMS responded around 0700AM and pt refused to come to hospital. CP came back and EMS was called again.  Pt took a total of 650 ASA at home and a total of 6 NTG.  En route with EMS pt began having CP again, 1 NTG was given with relief from pain.  Pt reports radiating to BL shoulder.  EKG unremarkable, RBBB.  Chest pain free on arrival.  Hx: CABG, stents.  BP-128/64.  18g LAC.  Pt a x 4, NAD, MD at bedside, family at bedside.

## 2015-05-28 NOTE — H&P (Signed)
History and Physical   Patient ID: [name] MRN: [id number], DOB/AGE: [birth year] 81 y.o. Date of Encounter: 05/28/2015  Primary Physician: Precious Reel, MD Primary Cardiologist: Dr Wynonia Lawman  Chief Complaint:  Chest pain  HPI: [name] is a 81 y.o. male with a history of CABG x 2, L main stent, SVG-D  Pt woke with chest pain about midnight. Symptoms relieved by SL NTG x 1. At 2 am, more chest pain and more NTG. He had recurrent pain and took another SL NTG. The intervals between chest pain were getting shorter, so he called EMS. EMS gave him SL NTG and his symptoms have not returned in the last 6 hours.  Recently, he has had some chest pain, and when he feels it coming on, he will take a nitro. This will relieve the pain and he will not have to take another one for a couple of days. The pain has been his usual angina. He has not had unusual SOB, DOE, no light-headed feeling or palpitations. No recent illness. He got 2 injections recently in his hip and for the first one, he held his cardiac medications. For the second one, he had to hold only Plavix for 5 days. He is compliant with all his medications.    No bleeding issues, no abdominal/GI symptoms.  Past Medical History  Diagnosis Date  . H/O prostate cancer     Prior cryoablation   . Hyperlipidemia 06/26/2012  . S/P CABG (coronary artery bypass graft)   . Villous adenoma of rectum   . Anxiety   . CAD (coronary artery disease) 06/26/2012    PTCA RCA 1995 CABG w. LIMA to LAD, SVG-D1, SVG-PD-PL 09/1998 Dr. Servando Snare for 3VD; Cypher stent SVG-RCA  2005; redo CABG 07/2005 w/ SVG-OM and SVG-PD-PL;  DR. Wynonia Lawman IS PT'S CARDIOLOGIST  . Memory difficulties   . Hearing loss     bilateral  . Arthritis   . Angina pectoris [facility])     Surgical History:  Past Surgical History  Procedure Laterality Date  . Coronary artery bypass graft  2000, 2007    LIMA-LAD, SVG-D1, SVG-PDA-PLR March 2000,  SVG to D1 vs OM and SVG-PDA-PL  07/2005  . Partial colectomy  2007  . Prostate cryoablation    . Hernia repair    . Nasal septum surgery    . Lumbar laminectomy/decompression microdiscectomy N/A 02/19/2014    Procedure: MICRO LUMBAR DECOMPRESSION L4-5/L3-4;  Surgeon: Johnn Hai, MD;  Location: [facility] ORS;  Service: Orthopedics;  Laterality: N/A;  . Left heart catheterization with coronary/graft angiogram N/A 06/29/2012    Procedure: LEFT HEART CATHETERIZATION WITH Beatrix Fetters;  Surgeon: Sinclair Grooms, MD; DES L main  . Percutaneous coronary stent intervention (pci-s) N/A 07/03/2012    Procedure: PERCUTANEOUS CORONARY STENT INTERVENTION (PCI-S);  Surgeon: Sinclair Grooms, MD;  Location: [facility] CATH LAB;  Service: Cardiovascular;  Laterality: N/A;  . Left heart catheterization with coronary/graft angiogram N/A 02/28/2013    Procedure: LEFT HEART CATHETERIZATION WITH Beatrix Fetters;  Surgeon: Jacolyn Reedy, MD;  Location: [facility] CATH LAB;  Service: Cardiovascular;  Laterality: N/A;  . Cataract Bilateral     08-13-14  . Total hip arthroplasty Left 09/04/2014    Procedure: LEFT TOTAL HIP ARTHROPLASTY ANTERIOR APPROACH;  Surgeon: Elie Goody, MD;  Location: [facility] ORS;  Service: Orthopedics;  Laterality: Left;  . Back surgery    . Hip surgery       I have reviewed  the patient's current medications. Medication Sig  alfuzosin (UROXATRAL) 10 MG 24 hr tablet Take 10 mg by mouth at bedtime.  amLODipine (NORVASC) 5 MG tablet Take 5 mg by mouth every morning.   aspirin EC 325 MG tablet Take 1 tablet (325 mg total) by mouth daily after a meal. Took and extra ASA today at EMS direction  bimatoprost (LUMIGAN) 0.03 % ophthalmic solution Place 1 drop into both eyes at bedtime.   cholecalciferol (VITAMIN D) 1000 UNITS tablet Take 1,000 Units by mouth every morning.   clopidogrel (PLAVIX) 75 MG tablet Take 75 mg by mouth every morning.   finasteride (PROSCAR) 5 MG tablet Take 5 mg by mouth daily.  gabapentin  (NEURONTIN) 300 MG capsule Take 300 mg by mouth at bedtime.  GARLIC PO Take 1 tablet by mouth every morning.   isosorbide mononitrate (IMDUR) 120 MG 24 hr tablet Take 120 mg by mouth daily with lunch.   memantine (NAMENDA XR) 28 MG CP24 24 hr capsule Take 28 mg by mouth daily.  metoprolol (LOPRESSOR) 50 MG tablet Take 75 mg by mouth 2 (two) times daily.  nitroGLYCERIN (NITROSTAT) 0.4 MG SL tablet Place 0.4 mg under the tongue every 5 (five) minutes as needed for chest pain.   OVER THE COUNTER MEDICATION Take 1 capsule by mouth every morning. Mega Red Omega-3 Krill Oil  Timolol Maleate (ISTALOL) 0.5 % (DAILY) SOLN Place 1 drop into both eyes every morning.    Scheduled Meds: . ranolazine  500 mg Oral BID   Continuous Infusions: . sodium chloride 20 mL (05/28/15 1131)   Allergies:  Allergies  Allergen Reactions  . Dilaudid [Hydromorphone Hcl]     Prefers not to have"causes paranoia"  . Fenofibrate Other (See Comments)    Reaction unknown  . Gemfibrozil Other (See Comments)    Reaction unknown  . Red Yeast Rice [Cholestin] Other (See Comments)    Reaction unknown   . Statins Other (See Comments)    Muscle pain  . Zetia [Ezetimibe] Other (See Comments)    Reaction unknown     Social History   Social History  . Marital Status: Married    Spouse Name: N/A  . Number of Children: N/A  . Years of Education: N/A   Occupational History  . Retired    Social History Main Topics  . Smoking status: Former Research scientist (life sciences)  . Smokeless tobacco: Never Used     Comment: quit in 1989  . Alcohol Use: 0.6 oz/week    1 Standard drinks or equivalent per week     Comment: rare  . Drug Use: No  . Sexual Activity: Not on file   Other Topics Concern  . Not on file   Social History Narrative   Retired, worked in Engineer, water     Family History  Problem Relation Age of Onset  . Prostate cancer Father   . Lung cancer Mother   . CVA Brother   . Brain  cancer Daughter    Family Status  Relation Status Death Age  . Father Deceased 66  . Mother Deceased 70  . Brother Deceased 48  . Sister Alive   . Daughter      history of brain cancer    Review of Systems:   Full 14-point review of systems otherwise negative except as noted above.  Physical Exam: Blood pressure 133/79, pulse 67, temperature 97.8 F (36.6 C), temperature source Oral, resp. rate 16, height '5\' 7"'$  (1.702 m),  weight 180 lb (81.647 kg), SpO2 97 %. General: Well developed, well nourished,male in no acute distress. Head: Normocephalic, atraumatic, sclera non-icteric, no xanthomas, nares are without discharge. Dentition: good Neck: No carotid bruits. JVD not elevated. No thyromegally Lungs: Good expansion bilaterally. without wheezes or rhonchi.  Heart: Regular rate and rhythm with S1 S2.  No S3 or S4.  No murmur, no rubs, or gallops appreciated. Abdomen: Soft, non-tender, non-distended with normoactive bowel sounds. No hepatomegaly. No rebound/guarding. No obvious abdominal masses. Msk:  Strength and tone appear normal for age. No joint deformities or effusions, no spine or costo-vertebral angle tenderness. Extremities: No clubbing or cyanosis. No edema.  Distal pedal pulses are 2+ in 4 extrem Neuro: Alert and oriented X 3. Moves all extremities spontaneously. No focal deficits noted. Psych:  Responds to questions appropriately with a normal affect. Skin: No rashes or lesions noted  Labs:   Lab Results  Component Value Date   WBC 11.9* 05/28/2015   HGB 11.2* 05/28/2015   HCT 35.4* 05/28/2015   MCV 84.5 05/28/2015   PLT 110* 05/28/2015    Recent Labs  05/28/15 1047  INR 1.43     Recent Labs Lab 05/28/15 1047  NA 139  K 5.3*  CL 110  CO2 21*  BUN 17  CREATININE 1.36*  CALCIUM 8.8*  PROT 5.3*  BILITOT 0.9  ALKPHOS 63  ALT 14*  AST 36  GLUCOSE 194*    Recent Labs  05/28/15 1100  TROPIPOC 0.00   Lab Results  Component Value Date   CHOL 171  02/25/2015   HDL 31* 02/25/2015   LDLCALC 99 02/25/2015   TRIG 205* 02/25/2015   Radiology/Studies: Dg Chest Portable 1 View 05/28/2015  CLINICAL DATA:  Chest pain EXAM: PORTABLE CHEST 1 VIEW COMPARISON:  Chest x-ray dated 02/24/2015. FINDINGS: Patient is status post median sternotomy for CABG. Median sternotomy wires appear intact and stable in alignment. Heart size is upper normal, unchanged. Overall cardiomediastinal silhouette is stable in size and configuration. Lungs are clear. Lung volumes are normal. No pleural effusion seen. No pneumothorax. No acute osseous abnormality. IMPRESSION: Stable chest x-ray. No evidence of acute cardiopulmonary abnormality. Electronically Signed   By: Franki Cabot M.D.   On: 05/28/2015 10:58   Cardiac Cath: 02/2013 CORONARY ARTERIES: Arise and distribute normally. Right dominant. Dense coronary calcification is noted. Left main coronary artery: The previously placed drug-eluting stent is widely patent. The vessel is heavily calcified. Left anterior descending: Severe eccentric stenosis prior to septal perforator branch and is then totally occluded. A diagonal branch arises at a right angle off of this prior to septal perforator and has a segmental 70-80% stenosis. The previous insertion site of the vein graft has retrograde flow and is then occluded. There is collateral filling of the distal right coronary circulation from the left coronary artery. Circumflex coronary artery: Supplies a small marginal branch and is patent Right coronary artery: Stent in the proximal vessel is widely patent with mild irregularity proximal to the stent. There is a stenosis at the acute marginal branch of the vessel is then totally occluded and fills by collaterals through the left coronary system.. Saphenous vein graft to the diagonal branch is occluded in its midportion with evidence of thrombus. There is no antegrade flow. Internal mammary graft to LAD is widely patent. LEFT  VENTRICULOGRAM: Performed in the 30 RAO projection. The aortic and mitral valves are normal. The left ventricle is normal in size with normal wall motion. Estimated ejection fraction  is 60%. IMPRESSIONS: 1. Severe native vessel coronary artery disease with a patent left main stent, severe proximal LAD stenosis following the LAD stent, severe disease involving diagonal branch which is now in revascularize and occlusion of the right coronary artery 2. Occlusion intervally of the vein graft to the diagonal branch 3. Potential sites of ischemia of the distal right coronary circulation, diagonal circulation which is a moderate size branch 4. Normal left ventricle.[name] RECOMMENDATION: Continued medical therapy. The patient is not a candidate for further interventional therapy according to Dr. Tamala Julian.  Echo: 02/26/2015 - Left ventricle: The cavity size was normal. Wall thickness was increased in a pattern of moderate LVH. Systolic function was normal. The estimated ejection fraction was in the range of 55% to 60%. Wall motion was normal; there were no regional wall motion abnormalities. Doppler parameters are consistent with abnormal left ventricular relaxation (grade 1 diastolic dysfunction). - Left atrium: The atrium was moderately to severely dilated.  ECG: 05/28/2015 SR, RBBB is old  ASSESSMENT AND PLAN:  Principal Problem:   Angina pectoris ([place]) - admit, r/o MI - add Ranexa (pt was on 02/2015 admit, but since d/c'd, pt and wife not sure why) - symptoms started after holding Plavix for 5 days, then getting a hip injection (not sure if that is relevant but it may be) - see Dr Thurman Coyer note from 02/2015 admission, Dr Tamala Julian did not see areas for possible PCI at last cath, Dr Wynonia Lawman did not feel stress test needed.  - Therefore, no further workup planned unless enzymes elevate - continue home nitrates - DVT Lovenox unless enzymes elevated - possible d/c in am on Ranexa if  remains pain-free  Active Problems:   Essential hypertension - good control on current rx    Hyperkalemia - spoke w/ lab, specimen was mildly hemolyzed. - will recheck in am, no treatment for now  Signed, Lenoard Aden 05/28/2015 2:57 PM Beeper [phone]   The patient was seen and examined, and I agree with the assessment and plan as documented above. Plan discussed in detail with Rosaria Ferries PA-C. Will add Ranexa 500 mg bid and check serial troponins. As he has been on Imdur for quite some time as per patient and wife, he may have developed nitrate tolerance for which temporary cessation would be recommended.   Kate Sable, MD, [facility]  05/28/2015 3:20 PM

## 2015-05-28 NOTE — Plan of Care (Signed)
Problem: Phase I Progression Outcomes Goal: Anginal pain relieved Outcome: Not Progressing Patient with 2 episodes of chest pain relieved with SL NTG.  Plan to start Ranexa. Goal: Aspirin unless contraindicated Outcome: Completed/Met Date Met:  05/28/15 Taken at home  Problem: Education: Goal: Knowledge of [place] Education information/materials will improve Outcome: Progressing Discussed plan of care with patient.  Plan to start new medication (ranexa) for his chest pain and possible discharge home in the morning if pain free, patient stated his understanding.  Reviewed falls safety plan and need to call for assistance.  Problem: Health Behavior/Discharge Planning: Goal: Ability to manage health-related needs will improve Outcome: Progressing Requested copy of advance directive from wife  Problem: Pain Managment: Goal: General experience of comfort will improve Outcome: Not Progressing Patient with 2 episodes of chest pain relieved with SL NTG.

## 2015-05-28 NOTE — ED Provider Notes (Signed)
CSN: [id number]     Arrival date & time 05/28/15  1015 History   First MD Initiated Contact with Patient 05/28/15 1018     Chief Complaint  Patient presents with  . Chest Pain     (Consider location/radiation/quality/duration/timing/severity/associated sxs/prior Treatment) HPI  81 y.o. Male with history of cad presents today with chest pain very similar to presentation in August. He again has stopped his medications due to a injection for pain. He states he had some painful and that 2 weeks ago. He stopped his medication again and had an injection yesterday. He awoke at around midnight with chest pain with bilateral shoulder pain that he identifies as similar to his previous cardiac pain. He took nitroglycerin 6 times at home prior to coming in. Each time the pain will be relieved but then return. EMS was called they came again nitroglycerin and aspirin. He is currently not having any pain. He denies shortness of breath, diaphoresis, nausea, or vomiting. Dr. Wynonia Lawman is his cardiologist  Past Medical History  Diagnosis Date  . H/O prostate cancer     Prior cryoablation   . Hyperlipidemia 06/26/2012  . S/P CABG (coronary artery bypass graft)   . Villous adenoma of rectum   . Anxiety   . CAD (coronary artery disease) 06/26/2012    PTCA RCA 1995 CABG w. LIMA to LAD, SVG to dx 1, PD and PL March 2000 Dr. Servando Snare for 3VD Cypher stent SVG to RCA  2005 redo CABG 07/2005 with SVG to OM and SVG to PD-PL   DR. TILLEY IS PT'S CARDIOLOGIST  . Memory difficulties   . Hearing loss     bilateral  . Arthritis   . Angina pectoris [facility])    Past Surgical History  Procedure Laterality Date  . Coronary artery bypass graft  2000, 2007    LIMA and SVGs to Diag 1 PDA PLR March 2000,  SVG to OM and SVG to PDA-PL 07/2005  . Partial colectomy  2007  . Prostate cryoablation    . Hernia repair    . Nasal septum surgery    . Lumbar laminectomy/decompression microdiscectomy N/A 02/19/2014    Procedure: MICRO  LUMBAR DECOMPRESSION L4-5/L3-4;  Surgeon: Johnn Hai, MD;  Location: [facility];  Service: Orthopedics;  Laterality: N/A;  . Left heart catheterization with coronary/graft angiogram N/A 06/29/2012    Procedure: LEFT HEART CATHETERIZATION WITH Beatrix Fetters;  Surgeon: Sinclair Grooms, MD;  Location: [facility] CATH LAB;  Service: Cardiovascular;  Laterality: N/A;  . Percutaneous coronary stent intervention (pci-s) N/A 07/03/2012    Procedure: PERCUTANEOUS CORONARY STENT INTERVENTION (PCI-S);  Surgeon: Sinclair Grooms, MD;  Location: [facility] CATH LAB;  Service: Cardiovascular;  Laterality: N/A;  . Left heart catheterization with coronary/graft angiogram N/A 02/28/2013    Procedure: LEFT HEART CATHETERIZATION WITH Beatrix Fetters;  Surgeon: Jacolyn Reedy, MD;  Location: [facility] CATH LAB;  Service: Cardiovascular;  Laterality: N/A;  . Cataract Bilateral     08-13-14  . Total hip arthroplasty Left 09/04/2014    Procedure: LEFT TOTAL HIP ARTHROPLASTY ANTERIOR APPROACH;  Surgeon: Elie Goody, MD;  Location: [facility]S;  Service: Orthopedics;  Laterality: Left;  . Back surgery    . Hip surgery     Family History  Problem Relation Age of Onset  . Prostate cancer Father   . Lung cancer Mother   . CVA Brother   . Brain cancer Daughter    Social History  Substance Use Topics  .  Smoking status: Former Research scientist (life sciences)  . Smokeless tobacco: Never Used     Comment: quit in 1989  . Alcohol Use: 0.6 oz/week    1 Standard drinks or equivalent per week     Comment: rare    Review of Systems  All other systems reviewed and are negative.     Allergies  Dilaudid; Fenofibrate; Gemfibrozil; Red yeast rice; Statins; and Zetia  Home Medications   Prior to Admission medications   Medication Sig Start Date End Date Taking? Authorizing Provider  amLODipine (NORVASC) 5 MG tablet Take 5 mg by mouth every morning.     Historical Provider, MD  aspirin EC 325 MG tablet Take 1 tablet (325 mg total) by  mouth 2 (two) times daily after a meal. Patient taking differently: Take 325 mg by mouth 2 (two) times daily after a meal. 1 DAILY 09/11/14   Rod Can, MD  bimatoprost (LUMIGAN) 0.03 % ophthalmic solution Place 1 drop into both eyes at bedtime.     Historical Provider, MD  cholecalciferol (VITAMIN D) 1000 UNITS tablet Take 1,000 Units by mouth every morning.     Historical Provider, MD  clopidogrel (PLAVIX) 75 MG tablet Take 75 mg by mouth every morning.     Historical Provider, MD  GARLIC PO Take 1 tablet by mouth every morning.     Historical Provider, MD  isosorbide mononitrate (IMDUR) 120 MG 24 hr tablet Take 120 mg by mouth daily with lunch.     Historical Provider, MD  Memantine HCl ER 21 MG CP24 Take 1 capsule by mouth daily.    Historical Provider, MD  metoprolol (LOPRESSOR) 50 MG tablet Take 75 mg by mouth 2 (two) times daily.    Historical Provider, MD  nitroGLYCERIN (NITROSTAT) 0.4 MG SL tablet Place 0.4 mg under the tongue every 5 (five) minutes as needed for chest pain.     Historical Provider, MD  OVER THE COUNTER MEDICATION Take 1 capsule by mouth every morning. [place]    Historical Provider, MD  ranolazine (RANEXA) 1000 MG SR tablet Take 500 mg by mouth 2 (two) times daily.    Historical Provider, MD  Timolol Maleate (ISTALOL) 0.5 % (DAILY) SOLN Place 1 drop into both eyes every morning.     Historical Provider, MD   BP 126/59 mmHg  Pulse 74  Temp(Src) 97.9 F (36.6 C) (Oral)  Resp 17  Ht '5\' 7"'$  (1.702 m)  Wt 81.647 kg  BMI 28.19 kg/m2  SpO2 100% Physical Exam  Constitutional: He is oriented to person, place, and time. He appears well-developed and well-nourished.  HENT:  Head: Normocephalic and atraumatic.  Right Ear: External ear normal.  Left Ear: External ear normal.  Nose: Nose normal.  Mouth/Throat: Oropharynx is clear and moist.  Eyes: Conjunctivae and EOM are normal. Pupils are equal, round, and reactive to light.  Neck: Normal range of  motion. Neck supple.  Cardiovascular: Normal rate, regular rhythm, normal heart sounds and intact distal pulses.   Pulmonary/Chest: Effort normal and breath sounds normal. No respiratory distress. He has no wheezes. He exhibits no tenderness.  Abdominal: Soft. Bowel sounds are normal. He exhibits no distension and no mass. There is no tenderness. There is no guarding.  Musculoskeletal: Normal range of motion.  Neurological: He is alert and oriented to person, place, and time. He has normal reflexes. He exhibits normal muscle tone. Coordination normal.  Skin: Skin is warm and dry.  Psychiatric: He has a normal mood and  affect. His behavior is normal. Judgment and thought content normal.  Nursing note and vitals reviewed.   ED Course  Procedures (including critical care time) Labs Review Labs Reviewed  CBC - Abnormal; Notable for the following:    WBC 11.9 (*)    RBC 4.19 (*)    Hemoglobin 11.2 (*)    HCT 35.4 (*)    RDW 15.6 (*)    All other components within normal limits  APTT  COMPREHENSIVE METABOLIC PANEL  PROTIME-INR  Randolm Idol, ED    Imaging Review Dg Chest Portable 1 View  05/28/2015  CLINICAL DATA:  Chest pain EXAM: PORTABLE CHEST 1 VIEW COMPARISON:  Chest x-Pia Jedlicka dated 02/24/2015. FINDINGS: Patient is status post median sternotomy for CABG. Median sternotomy wires appear intact and stable in alignment. Heart size is upper normal, unchanged. Overall cardiomediastinal silhouette is stable in size and configuration. Lungs are clear. Lung volumes are normal. No pleural effusion seen. No pneumothorax. No acute osseous abnormality. IMPRESSION: Stable chest x-Alexey Rhoads. No evidence of acute cardiopulmonary abnormality. Electronically Signed   By: Franki Cabot M.D.   On: 05/28/2015 10:58   I have personally reviewed and evaluated these images and lab results as part of my medical decision-making.   EKG Interpretation   Date/Time:  Thursday May 28 2015 10:28:21  EST Ventricular Rate:  75 PR Interval:  220 QRS Duration: 152 QT Interval:  434 QTC Calculation: 485 R Axis:   71 Text Interpretation:  Sinus rhythm Prolonged PR interval Right bundle  branch block Confirmed by Jenina Moening MD, Daisy Mcneel ([id number]) on 05/28/2015 11:06:48  AM      MDM   Unstable angine  Patient with known cardiac history who presents today with chest pain has resolved with nitroglycerin but returned multiple times. Initial evaluation here is negative for myocardial infarction. Plan admission for further evaluation and treatment.   Pattricia Boss, MD 06/06/15 [phone]

## 2015-05-28 NOTE — ED Notes (Signed)
Attempted to call report

## 2015-05-28 NOTE — Progress Notes (Signed)
Patient with 2 episodes of shoulder pain radiating to left chest 10/10, requesting SL NTG.  Pain relieved with 1 SL NTG each episode.  EKG obtained without acute changes.  Night nurse to give first dose of Ranexa.  Sanda Linger

## 2015-05-29 LAB — BASIC METABOLIC PANEL
Anion gap: 8 (ref 5–15)
BUN: 15 mg/dL (ref 6–20)
CALCIUM: 8.6 mg/dL — AB (ref 8.9–10.3)
CO2: 23 mmol/L (ref 22–32)
CREATININE: 1.27 mg/dL — AB (ref 0.61–1.24)
Chloride: 113 mmol/L — ABNORMAL HIGH (ref 101–111)
GFR calc Af Amer: 57 mL/min — ABNORMAL LOW (ref >60)
GFR, EST NON AFRICAN AMERICAN: 49 mL/min — AB (ref >60)
GLUCOSE: 95 mg/dL (ref 65–99)
Potassium: 3.6 mmol/L (ref 3.5–5.1)
SODIUM: 144 mmol/L (ref 135–145)

## 2015-05-29 LAB — TROPONIN I
TROPONIN I: 0.03 ng/mL (ref <0.031)
Troponin I: 0.04 ng/mL — ABNORMAL HIGH (ref <0.031)

## 2015-05-29 LAB — LIPID PANEL
Cholesterol: 177 mg/dL (ref 0–200)
HDL: 27 mg/dL — AB (ref >40)
LDL CALC: 105 mg/dL — AB (ref 0–99)
TRIGLYCERIDES: 224 mg/dL — AB (ref <150)
Total CHOL/HDL Ratio: 6.6 RATIO
VLDL: 45 mg/dL — AB (ref 0–40)

## 2015-05-29 MED ORDER — RANOLAZINE ER 500 MG PO TB12: 500.0000 mg | ORAL_TABLET | Freq: Two times a day (BID) | ORAL | Status: DC

## 2015-05-29 MED ORDER — OXYCODONE-ACETAMINOPHEN 5-325 MG PO TABS
1.0000 | ORAL_TABLET | Freq: Four times a day (QID) | ORAL | Status: DC | PRN
  Administered 2015-05-29: 1 via ORAL
  Filled 2015-05-29: qty 1

## 2015-05-29 NOTE — Progress Notes (Signed)
       Patient Name: [name] Date of Encounter: 05/29/2015    SUBJECTIVE:Chest pain has resolved.  TELEMETRY:  NSR: Filed Vitals:   05/28/15 1801 05/28/15 1850 05/28/15 2056 05/29/15 0557  BP: 146/68 142/50 141/60 114/49  Pulse:   74 64  Temp:   97.8 F (36.6 C) 98.3 F (36.8 C)  TempSrc:   Oral Oral  Resp:   18 18  Height:      Weight:    170 lb 9.6 oz (77.384 kg)  SpO2:   94% 97%    Intake/Output Summary (Last 24 hours) at 05/29/15 0809 Last data filed at 05/29/15 0510  Gross per 24 hour  Intake    716 ml  Output   1500 ml  Net   -784 ml   LABS: Basic Metabolic Panel:  Recent Labs  05/28/15 1047 05/29/15 0529  NA 139 144  K 5.3* 3.6  CL 110 113*  CO2 21* 23  GLUCOSE 194* 95  BUN 17 15  CREATININE 1.36* 1.27*  CALCIUM 8.8* 8.6*   CBC:  Recent Labs  05/28/15 1047  WBC 11.9*  HGB 11.2*  HCT 35.4*  MCV 84.5  PLT 110*   Cardiac Enzymes:  Recent Labs  05/28/15 1830 05/28/15 2330 05/29/15 0529  TROPONINI <0.03 0.03 0.04*   BNP: Invalid input(s): POCBNP Hemoglobin A1C: No results for input(s): HGBA1C in the last 72 hours. Fasting Lipid Panel:  Recent Labs  05/29/15 0529  CHOL 177  HDL 27*  LDLCALC 105*  TRIG 224*  CHOLHDL 6.6    Radiology/Studies:  No new data  Physical Exam: Blood pressure 114/49, pulse 64, temperature 98.3 F (36.8 C), temperature source Oral, resp. rate 18, height '5\' 7"'$  (1.702 m), weight 170 lb 9.6 oz (77.384 kg), SpO2 97 %. Weight change:   Wt Readings from Last 3 Encounters:  05/29/15 170 lb 9.6 oz (77.384 kg)  02/26/15 168 lb 3.4 oz (76.3 kg)  09/04/14 173 lb (78.472 kg)    Chest clear No pericardial rub No edema  ASSESSMENT:  1. Chest pain has resolved.No significant enzyme bump. 2. Hip pain 3. Hypertension, controlled.    Plan:  1. Percocet for hip pain 2. Ambulate and possibly home soon.  Valerie Roys W 05/29/2015, 8:09 AM

## 2015-05-29 NOTE — Discharge Summary (Signed)
Discharge Summary   Patient ID: [name],  MRN: [id number], DOB/AGE: 1926/10/13 81 y.o.  Admit date: 05/28/2015 Discharge date: 05/29/2015  Primary Care Provider: Precious Reel Primary Cardiologist: Dr Wynonia Lawman  Discharge Diagnoses Principal Problem:   Angina pectoris [facility]) Active Problems:   Essential hypertension   Hyperkalemia   Unstable angina (HCC)   Coronary artery disease involving native coronary artery of native heart with unstable angina pectoris (HCC)   Allergies Allergies  Allergen Reactions  . Dilaudid [Hydromorphone Hcl]     Prefers not to have"causes paranoia"  . Fenofibrate Other (See Comments)    Reaction unknown  . Gemfibrozil Other (See Comments)    Reaction unknown  . Red Yeast Rice [Cholestin] Other (See Comments)    Reaction unknown   . Statins Other (See Comments)    Muscle pain  . Zetia [Ezetimibe] Other (See Comments)    Reaction unknown     Consultant: None  Procedures: None  History of Present Illness  [name] is a 81 y.o. male with a history of CABG x 2, L main stent, SVG-D  Pt woke with chest pain about midnight 05/27/15. Symptoms relieved by SL NTG x 1. At 2 am, more chest pain and more NTG. He had recurrent pain and took another SL NTG. The intervals between chest pain were getting shorter, so he called EMS. EMS gave him SL NTG and his symptoms have not returned in the last 6 hours.  Recently, he has had some chest pain, and when he feels it coming on, he will take a nitro. This will relieve the pain and he will not have to take another one for a couple of days. The pain has been his usual angina. He has not had unusual SOB, DOE, no light-headed feeling or palpitations. No recent illness. He got 2 injections recently in his hip and for the first one, he held his cardiac medications. For the second one, he had to hold only Plavix for 5 days. He is compliant with all his medications.   No bleeding issues, no abdominal/GI  symptoms.  Hospital Course  The patient was admitted for overnight observation and added Ranexa '500mg'$  BID. His K was elevated at admission, confirmed with lab that specimen was mildly hemolyzed. Resoled on repeat check. Overnight cp resolved. Trop trend was 0.03->0.03->0.04. He was given percocet for hip pain. His BP was well controlled on home regimen. Platelet Function P2Y12 was normal. EKG non acute. Ambulated well without any chest pain.   As he has been on Imdur for quite some time as per patient and wife, he may have developed nitrate tolerance for which temporary cessation would be recommended.   She has been seen by Dr. Tamala Julian today and deemed ready for discharge home. All follow-up appointments have been scheduled. Discharge medications are listed below.   Will need renal function check in 1 weeks given initiation of Renexa. The patient will call Dr. Thurman Coyer office Monday 11/28 to make f/u appointment and schedule renal function check.   Discharge Vitals Blood pressure 114/49, pulse 64, temperature 98.3 F (36.8 C), temperature source Oral, resp. rate 18, height '5\' 7"'$  (1.702 m), weight 170 lb 9.6 oz (77.384 kg), SpO2 97 %.  Filed Weights   05/28/15 1027 05/28/15 1707 05/29/15 0557  Weight: 180 lb (81.647 kg) 173 lb 8 oz (78.699 kg) 170 lb 9.6 oz (77.384 kg)    Labs  CBC  Recent Labs  05/28/15 1047  WBC 11.9*  HGB  11.2*  HCT 35.4*  MCV 84.5  PLT 706*   Basic Metabolic Panel  Recent Labs  05/28/15 1047 05/29/15 0529  NA 139 144  K 5.3* 3.6  CL 110 113*  CO2 21* 23  GLUCOSE 194* 95  BUN 17 15  CREATININE 1.36* 1.27*  CALCIUM 8.8* 8.6*   Liver Function Tests  Recent Labs  05/28/15 1047  AST 36  ALT 14*  ALKPHOS 63  BILITOT 0.9  PROT 5.3*  ALBUMIN 3.5   No results for input(s): LIPASE, AMYLASE in the last 72 hours. Cardiac Enzymes  Recent Labs  05/28/15 1830 05/28/15 2330 05/29/15 0529  TROPONINI <0.03 0.03 0.04*  Fasting Lipid Panel  Recent  Labs  05/29/15 0529  CHOL 177  HDL 27*  LDLCALC 105*  TRIG 224*  CHOLHDL 6.6   Thyroid Function Tests No results for input(s): TSH, T4TOTAL, T3FREE, THYROIDAB in the last 72 hours.  Invalid input(s): FREET3  Disposition  Pt is being discharged home today in good condition.  Follow-up Plans & Appointments  Follow-up Information    Follow up with Ezzard Standing, MD. Call in 1 week.   Specialty:  Cardiology   Contact information:   [place] [address] [phone]           Discharge Instructions    Diet - low sodium heart healthy    Complete by:  As directed      Increase activity slowly    Complete by:  As directed            F/u Labs/Studies: Bmet in one week.   Discharge Medications    Medication List    TAKE these medications        alfuzosin 10 MG 24 hr tablet  Commonly known as:  UROXATRAL  Take 10 mg by mouth at bedtime.     amLODipine 5 MG tablet  Commonly known as:  NORVASC  Take 5 mg by mouth every morning.     aspirin EC 325 MG tablet  Take 1 tablet (325 mg total) by mouth 2 (two) times daily after a meal.     bimatoprost 0.03 % ophthalmic solution  Commonly known as:  LUMIGAN  Place 1 drop into both eyes at bedtime.     cholecalciferol 1000 UNITS tablet  Commonly known as:  VITAMIN D  Take 1,000 Units by mouth every morning.     clopidogrel 75 MG tablet  Commonly known as:  PLAVIX  Take 75 mg by mouth every morning.     finasteride 5 MG tablet  Commonly known as:  PROSCAR  Take 5 mg by mouth daily.     gabapentin 300 MG capsule  Commonly known as:  NEURONTIN  Take 300 mg by mouth at bedtime.     GARLIC PO  Take 1 tablet by mouth every morning.     isosorbide mononitrate 120 MG 24 hr tablet  Commonly known as:  IMDUR  Take 120 mg by mouth daily with lunch.     ISTALOL 0.5 % (DAILY) Soln  Generic drug:  Timolol Maleate  Place 1 drop into both eyes every morning.     metoprolol 50 MG  tablet  Commonly known as:  LOPRESSOR  Take 75 mg by mouth 2 (two) times daily.     NAMENDA XR 28 MG Cp24 24 hr capsule  Generic drug:  memantine  Take 28 mg by mouth daily.     nitroGLYCERIN 0.4 MG  SL tablet  Commonly known as:  NITROSTAT  Place 0.4 mg under the tongue every 5 (five) minutes as needed for chest pain.     OVER THE COUNTER MEDICATION  Take 1 capsule by mouth every morning. Mega Red Omega-3 Krill Oil     ranolazine 500 MG 12 hr tablet  Commonly known as:  RANEXA  Take 1 tablet (500 mg total) by mouth 2 (two) times daily.        Duration of Discharge Encounter   Greater than 30 minutes including physician time.  Signed, Keontre Defino PA-C 05/29/2015, 11:56 AM

## 2015-08-14 ENCOUNTER — Other Ambulatory Visit (HOSPITAL_COMMUNITY): Payer: Self-pay | Admitting: Orthopedic Surgery

## 2015-08-14 DIAGNOSIS — G8929 Other chronic pain: Secondary | ICD-10-CM

## 2015-08-14 DIAGNOSIS — M25552 Pain in left hip: Principal | ICD-10-CM

## 2015-08-24 ENCOUNTER — Ambulatory Visit (HOSPITAL_COMMUNITY)
Admission: RE | Admit: 2015-08-24 | Discharge: 2015-08-24 | Disposition: A | Discharge status: Home / Self Care | Payer: Medicare Other | Source: Ambulatory Visit | Attending: Orthopedic Surgery | Admitting: Orthopedic Surgery

## 2015-08-24 ENCOUNTER — Encounter (HOSPITAL_COMMUNITY)
Admission: RE | Admit: 2015-08-24 | Discharge: 2015-08-24 | Disposition: A | Discharge status: Home / Self Care | Payer: Medicare Other | Source: Ambulatory Visit | Attending: Orthopedic Surgery | Admitting: Orthopedic Surgery

## 2015-08-24 DIAGNOSIS — Z96642 Presence of left artificial hip joint: Secondary | ICD-10-CM | POA: Diagnosis not present

## 2015-08-24 DIAGNOSIS — M25552 Pain in left hip: Secondary | ICD-10-CM | POA: Insufficient documentation

## 2015-08-24 DIAGNOSIS — G8929 Other chronic pain: Secondary | ICD-10-CM | POA: Diagnosis not present

## 2015-08-24 MED ORDER — TECHNETIUM TC 99M MEDRONATE IV KIT
25.7000 | PACK | Freq: Once | INTRAVENOUS | Status: AC | PRN
  Administered 2015-08-24: 25.7 via INTRAVENOUS

## 2015-09-23 ENCOUNTER — Other Ambulatory Visit (HOSPITAL_COMMUNITY): Payer: Self-pay | Admitting: Internal Medicine

## 2015-09-23 ENCOUNTER — Ambulatory Visit (HOSPITAL_COMMUNITY)
Admission: RE | Admit: 2015-09-23 | Discharge: 2015-09-23 | Disposition: A | Discharge status: Home / Self Care | Payer: Medicare Other | Source: Ambulatory Visit | Attending: Vascular Surgery | Admitting: Vascular Surgery

## 2015-09-23 DIAGNOSIS — I2510 Atherosclerotic heart disease of native coronary artery without angina pectoris: Secondary | ICD-10-CM | POA: Diagnosis not present

## 2015-09-23 DIAGNOSIS — I6529 Occlusion and stenosis of unspecified carotid artery: Secondary | ICD-10-CM

## 2015-09-23 DIAGNOSIS — E785 Hyperlipidemia, unspecified: Secondary | ICD-10-CM | POA: Diagnosis not present

## 2015-09-28 ENCOUNTER — Other Ambulatory Visit: Payer: Self-pay | Admitting: Physician Assistant

## 2015-11-04 ENCOUNTER — Other Ambulatory Visit: Payer: Self-pay | Admitting: Orthopedic Surgery

## 2015-11-13 NOTE — Progress Notes (Signed)
Called Dr Tollie Eth, patient's cardiologist for clearance and office note, he personally called me back to say that Mr Linden is not a good candidate for [facility]. Dr Levell July office notified.

## 2015-11-19 ENCOUNTER — Encounter (HOSPITAL_BASED_OUTPATIENT_CLINIC_OR_DEPARTMENT_OTHER): Admission: RE | Payer: Self-pay | Source: Ambulatory Visit

## 2015-11-19 SURGERY — REPAIR, NAIL BED
Anesthesia: Choice | Site: Finger | Laterality: Right

## 2015-12-02 ENCOUNTER — Ambulatory Visit (HOSPITAL_BASED_OUTPATIENT_CLINIC_OR_DEPARTMENT_OTHER): Admission: RE | Admit: 2015-12-02 | Payer: Medicare Other | Source: Ambulatory Visit | Admitting: Orthopedic Surgery

## 2015-12-15 ENCOUNTER — Encounter (HOSPITAL_COMMUNITY)
Admission: RE | Admit: 2015-12-15 | Discharge: 2015-12-15 | Disposition: A | Discharge status: Home / Self Care | Payer: Medicare Other | Source: Ambulatory Visit | Attending: Orthopedic Surgery | Admitting: Orthopedic Surgery

## 2015-12-15 ENCOUNTER — Encounter (HOSPITAL_COMMUNITY): Payer: Self-pay

## 2015-12-15 DIAGNOSIS — I2510 Atherosclerotic heart disease of native coronary artery without angina pectoris: Secondary | ICD-10-CM | POA: Diagnosis not present

## 2015-12-15 DIAGNOSIS — Z85038 Personal history of other malignant neoplasm of large intestine: Secondary | ICD-10-CM | POA: Diagnosis not present

## 2015-12-15 DIAGNOSIS — Z951 Presence of aortocoronary bypass graft: Secondary | ICD-10-CM | POA: Diagnosis not present

## 2015-12-15 DIAGNOSIS — Z96642 Presence of left artificial hip joint: Secondary | ICD-10-CM | POA: Diagnosis not present

## 2015-12-15 DIAGNOSIS — L608 Other nail disorders: Secondary | ICD-10-CM | POA: Diagnosis not present

## 2015-12-15 DIAGNOSIS — Z7902 Long term (current) use of antithrombotics/antiplatelets: Secondary | ICD-10-CM | POA: Diagnosis not present

## 2015-12-15 DIAGNOSIS — I10 Essential (primary) hypertension: Secondary | ICD-10-CM | POA: Diagnosis not present

## 2015-12-15 DIAGNOSIS — Z87891 Personal history of nicotine dependence: Secondary | ICD-10-CM | POA: Diagnosis not present

## 2015-12-15 DIAGNOSIS — Z7982 Long term (current) use of aspirin: Secondary | ICD-10-CM | POA: Diagnosis not present

## 2015-12-15 DIAGNOSIS — Z9049 Acquired absence of other specified parts of digestive tract: Secondary | ICD-10-CM | POA: Diagnosis not present

## 2015-12-15 DIAGNOSIS — Z955 Presence of coronary angioplasty implant and graft: Secondary | ICD-10-CM | POA: Diagnosis not present

## 2015-12-15 DIAGNOSIS — Z79899 Other long term (current) drug therapy: Secondary | ICD-10-CM | POA: Diagnosis not present

## 2015-12-15 DIAGNOSIS — Z8546 Personal history of malignant neoplasm of prostate: Secondary | ICD-10-CM | POA: Diagnosis not present

## 2015-12-15 LAB — CBC
HCT: 40.6 % (ref 39.0–52.0)
Hemoglobin: 12.8 g/dL — ABNORMAL LOW (ref 13.0–17.0)
MCH: 26.4 pg (ref 26.0–34.0)
MCHC: 31.5 g/dL (ref 30.0–36.0)
MCV: 83.7 fL (ref 78.0–100.0)
PLATELETS: 109 10*3/uL — AB (ref 150–400)
RBC: 4.85 MIL/uL (ref 4.22–5.81)
RDW: 15.9 % — AB (ref 11.5–15.5)
WBC: 9 10*3/uL (ref 4.0–10.5)

## 2015-12-15 LAB — BASIC METABOLIC PANEL
Anion gap: 5 (ref 5–15)
BUN: 16 mg/dL (ref 6–20)
CALCIUM: 8.9 mg/dL (ref 8.9–10.3)
CHLORIDE: 109 mmol/L (ref 101–111)
CO2: 26 mmol/L (ref 22–32)
CREATININE: 1.34 mg/dL — AB (ref 0.61–1.24)
GFR calc non Af Amer: 46 mL/min — ABNORMAL LOW (ref >60)
GFR, EST AFRICAN AMERICAN: 53 mL/min — AB (ref >60)
Glucose, Bld: 112 mg/dL — ABNORMAL HIGH (ref 65–99)
Potassium: 4.2 mmol/L (ref 3.5–5.1)
SODIUM: 140 mmol/L (ref 135–145)

## 2015-12-15 NOTE — Pre-Procedure Instructions (Signed)
[name]  12/15/2015      [id number] [place] [place] [address] [place] [address]Phone: [phone]: [phone]   Your procedure is scheduled on Thursday, June 15th, 2017.  [facility]   Call this number if you have problems the morning of surgery:  [phone]   Remember:  Do not eat food or drink liquids after midnight.   Take these medicines the morning of surgery with A SIP OF WATER: Amlodipine (Norvasc), Finasteride (Proscar), Gabapentin (Neurontin), Isosorbide Mononitrate (Imdur), Memantine (Namenda), Metoprolol (Lopressor), Ranolazine (Ranexa), eye drops.  Per your cardiologist, please stop taking Plavix.  Follow MD's instructions on your Aspirin.  Stop taking: Naproxen, Aleve, Ibuprofen, Advil, Motrin, BC's, Goody's, Fish oil, all herbal medications, and all vitamins.     Do not wear jewelry.  Do not wear lotions, powders, or colognes.  You may wear deodorant.  Men may shave face and neck.  Do not bring valuables to the hospital.   [facility] any belongings or [facility]ables.  Contacts, dentures or bridgework may not be worn into surgery.  Leave your suitcase in the car.  After surgery it may be brought to your room.  For patients admitted to the hospital, discharge time will be determined by your treatment team.  Patients discharged the day of surgery will not be allowed to drive home.   Special instructions:  See attached.   Please read over the following fact sheets that you were given.     [facility]- Preparing For Surgery  Before surgery, you can play an important role. Because skin is not sterile, your skin needs to be as free of germs as possible. You can reduce the number of germs on your skin by washing with CHG (chlorahexidine gluconate) Soap before surgery.  CHG is an antiseptic cleaner  which kills germs and bonds with the skin to continue killing germs even after washing.  Please do not use if you have an allergy to CHG or antibacterial soaps. If your skin becomes reddened/irritated stop using the CHG.  Do not shave (including legs and underarms) for at least 48 hours prior to first CHG shower. It is OK to shave your face.  Please follow these instructions carefully.   1. Shower the NIGHT BEFORE SURGERY and the MORNING OF SURGERY with CHG.   2. If you chose to wash your hair, wash your hair first as usual with your normal shampoo.  3. After you shampoo, rinse your hair and body thoroughly to remove the shampoo.  4. Use CHG as you would any other liquid soap. You can apply CHG directly to the skin and wash gently with a scrungie or a clean washcloth.   5. Apply the CHG Soap to your body ONLY FROM THE NECK DOWN.  Do not use on open wounds or open sores. Avoid contact with your eyes, ears, mouth and genitals (private parts). Wash genitals (private parts) with your normal soap.  6. Wash thoroughly, paying special attention to the area where your surgery will be performed.  7. Thoroughly rinse your body with warm water from the neck down.  8. DO NOT shower/wash with your normal soap after using and rinsing off the CHG Soap.  9. Pat yourself dry with a CLEAN TOWEL.   10. Wear CLEAN PAJAMAS   11. Place CLEAN SHEETS  on your bed the night of your first shower and DO NOT SLEEP WITH PETS.    Day of Surgery: Do not apply any deodorants/lotions. Please wear clean clothes to the hospital/surgery center.

## 2015-12-15 NOTE — Progress Notes (Signed)
PCP - Dr. Shon Baton Cardiologist - Dr. Wynonia Lawman  EKG - 05/28/15 CXR - 02/24/15  Echo - 02/2015 Cardiac Cath - 02/2013  Patient denies chest pain and shortness of breath at PAT appointment.    Cardiac clearance note in chart.  Patient's last dose of Plavix 12/15/15.

## 2015-12-15 NOTE — Progress Notes (Signed)
Per Velna Hatchet at Dr. Thurman Coyer office, patient is NOT to stop Aspirin.  Patient notified and verbalized understanding.

## 2015-12-15 NOTE — Progress Notes (Signed)
Called Dr. Thurman Coyer office to request cardiac clearance note as well as when patient needs to stop taking Plavix and Aspirin.  Per Juliann Pulse, Plavix can be stopped one week prior to the procedure but no instructions for Aspirin.  Juliann Pulse stated that she would confirm with Dr. Wynonia Lawman and return the nurse's phone call.

## 2015-12-16 MED ORDER — CEFAZOLIN SODIUM-DEXTROSE 2-4 GM/100ML-% IV SOLN
2.0000 g | INTRAVENOUS | Status: AC
  Administered 2015-12-17: 2 g via INTRAVENOUS
  Filled 2015-12-16: qty 100

## 2015-12-16 NOTE — Progress Notes (Signed)
Anesthesia Chart Review: Patient is a 82 year old male scheduled for nail unit biopsy, right long finger on 12/17/15 by Dr. Leanora Cover. DX: Right long finger malanonychia. Anesthesia type is posted for Choice. 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   PCP is Dr. Shon Baton. Cardiologist is Dr. Wynonia Lawman, last visit 12/11/15. He felt patient could undergo this procedure, but only in the hospital setting due to the severity of his CAD and angina previously. He gave permission to hold Plavix one week prior to surgery, but should remain on ASA. According to PAT RN notes, patient reported last Plavix dose was 12/15/15.   Meds include alfuzosin, amlodipine, ASA '325mg'$  2 daily, Lumigan and timolol ophthalmic, Plavix, finasteride, garlic, Imdur, Namenda XR, Lopressor, Nitro, Ranexa, krill oil.   02/26/15 Echo: Study Conclusions - Left ventricle: The cavity size was normal. Wall thickness was  increased in a pattern of moderate LVH. Systolic function was  normal. The estimated ejection fraction was in the range of 55%  to 60%. Wall motion was normal; there were no regional wall  motion abnormalities. Doppler parameters are consistent with  abnormal left ventricular relaxation (grade 1 diastolic  dysfunction). - Left atrium: The atrium was moderately to severely dilated.  02/24/13 Cardiac cath (Dr.Tilley):  CORONARY ARTERIES:Arise and distribute normally.  Right dominant. Dense coronary calcification is noted. - Left main coronary artery: The previously placed drug-eluting stent is widely patent. The vessel is heavily calcified. - Left anterior descending: Severe eccentric stenosis prior to septal perforator branch and is then totally occluded. A diagonal branch arises at a right angle off of this prior to septal perforator and has a segmental 70-80% stenosis. The previous insertion site of the vein graft has retrograde flow and is then occluded. There is collateral filling of the distal right coronary circulation from the left coronary artery. - Circumflex coronary artery: Supplies a small marginal branch and is patent - Right coronary artery: Stent in the proximal vessel is widely patent with mild irregularity proximal to the stent. There is a stenosis at the acute marginal branch of the vessel is then totally occluded and fills by collaterals through the left coronary system.. - Saphenous vein graft to the diagonal branch is occluded in its midportion with evidence of thrombus. There is no antegrade flow. - Internal mammary graft to LAD is widely patent. LEFT VENTRICULOGRAM: Performed in the 30 RAO projection. The aortic and mitral valves are normal. The left ventricle is normal in size with normal wall motion. Estimated ejection fraction is 60%. IMPRESSIONS: 1. Severe native vessel coronary artery disease with a patent left main stent, severe proximal LAD stenosis following the LAD stent, severe disease involving diagonal branch which is now in revascularize and occlusion of the right coronary artery 2. Occlusion intervally of the vein graft to the diagonal branch 3. Potential sites of ischemia of the distal right coronary circulation, diagonal circulation which is a moderate size branch 4. Normal left ventricle. RECOMMENDATION: Continued medical therapy. The patient  is not a candidate for further interventional therapy according to Dr.  Tamala Julian.  05/28/15 EKG: NSR, first degree AVB, right BBB. By notes, an EKG was repeated at Dr. Thurman Coyer office on 12/11/15 that showed "right bundle branch block and right posterior fascicular block." Will request tracing.  09/23/15 Carotid U/S: No significant stenosis of the bilateral ECAs or CCAs. Doppler velocities suggest 1-39% BICA stenosis.  05/28/15 1V CXR: IMPRESSION: Stable chest x-ray. No evidence of acute cardiopulmonary abnormality.  Preoperative labs noted. Cr 1.34. H/H 12.8/40.6.  PLT 109K, stable since at least 02/2015. Glcuose 112.   I notified Jeani Hawking at Dr. Levell July office that patient is remaining on ASA per Dr. Wynonia Lawman and that Plavix only held since 12/15/15. She will review with Dr. Fredna Dow to ensure that is not an issue from his standpoint. PLT count also reported, although numbers are stable. I updated anesthesiologist Dr. Therisa Doyne. Definitive anesthesia plan on the day of surgery following evaluation.   [facility] Short Stay Center/Anesthesiology Phone [phone] 12/16/2015 1:06 PM

## 2015-12-17 ENCOUNTER — Ambulatory Visit (HOSPITAL_COMMUNITY): Payer: Medicare Other | Admitting: Vascular Surgery

## 2015-12-17 ENCOUNTER — Encounter (HOSPITAL_COMMUNITY): Payer: Self-pay | Admitting: *Deleted

## 2015-12-17 ENCOUNTER — Ambulatory Visit (HOSPITAL_COMMUNITY)
Admission: RE | Admit: 2015-12-17 | Discharge: 2015-12-17 | Disposition: A | Discharge status: Home / Self Care | Payer: Medicare Other | Source: Ambulatory Visit | Attending: Orthopedic Surgery | Admitting: Orthopedic Surgery

## 2015-12-17 ENCOUNTER — Ambulatory Visit (HOSPITAL_COMMUNITY): Payer: Medicare Other | Admitting: Anesthesiology

## 2015-12-17 ENCOUNTER — Encounter (HOSPITAL_COMMUNITY)
Admission: RE | Disposition: A | Discharge status: Home / Self Care | Payer: Self-pay | Source: Ambulatory Visit | Attending: Orthopedic Surgery

## 2015-12-17 DIAGNOSIS — I2510 Atherosclerotic heart disease of native coronary artery without angina pectoris: Secondary | ICD-10-CM | POA: Insufficient documentation

## 2015-12-17 DIAGNOSIS — Z7982 Long term (current) use of aspirin: Secondary | ICD-10-CM | POA: Insufficient documentation

## 2015-12-17 DIAGNOSIS — I10 Essential (primary) hypertension: Secondary | ICD-10-CM | POA: Diagnosis not present

## 2015-12-17 DIAGNOSIS — L608 Other nail disorders: Secondary | ICD-10-CM | POA: Insufficient documentation

## 2015-12-17 DIAGNOSIS — Z955 Presence of coronary angioplasty implant and graft: Secondary | ICD-10-CM | POA: Diagnosis not present

## 2015-12-17 DIAGNOSIS — Z9049 Acquired absence of other specified parts of digestive tract: Secondary | ICD-10-CM | POA: Insufficient documentation

## 2015-12-17 DIAGNOSIS — Z96642 Presence of left artificial hip joint: Secondary | ICD-10-CM | POA: Insufficient documentation

## 2015-12-17 DIAGNOSIS — Z79899 Other long term (current) drug therapy: Secondary | ICD-10-CM | POA: Insufficient documentation

## 2015-12-17 DIAGNOSIS — Z85038 Personal history of other malignant neoplasm of large intestine: Secondary | ICD-10-CM | POA: Insufficient documentation

## 2015-12-17 DIAGNOSIS — Z87891 Personal history of nicotine dependence: Secondary | ICD-10-CM | POA: Insufficient documentation

## 2015-12-17 DIAGNOSIS — Z7902 Long term (current) use of antithrombotics/antiplatelets: Secondary | ICD-10-CM | POA: Insufficient documentation

## 2015-12-17 DIAGNOSIS — Z8546 Personal history of malignant neoplasm of prostate: Secondary | ICD-10-CM | POA: Insufficient documentation

## 2015-12-17 DIAGNOSIS — Z951 Presence of aortocoronary bypass graft: Secondary | ICD-10-CM | POA: Insufficient documentation

## 2015-12-17 HISTORY — PX: BONE BIOPSY: SHX375

## 2015-12-17 SURGERY — BIOPSY, BONE
Anesthesia: Monitor Anesthesia Care | Site: Finger | Laterality: Right

## 2015-12-17 MED ORDER — FENTANYL CITRATE (PF) 100 MCG/2ML IJ SOLN
INTRAMUSCULAR | Status: DC | PRN
  Administered 2015-12-17: 50 ug via INTRAVENOUS

## 2015-12-17 MED ORDER — PROPOFOL 10 MG/ML IV BOLUS
INTRAVENOUS | Status: DC | PRN
  Administered 2015-12-17: 30 mg via INTRAVENOUS

## 2015-12-17 MED ORDER — 0.9 % SODIUM CHLORIDE (POUR BTL) OPTIME
TOPICAL | Status: DC | PRN
  Administered 2015-12-17: 1000 mL

## 2015-12-17 MED ORDER — LIDOCAINE HCL (CARDIAC) 20 MG/ML IV SOLN
INTRAVENOUS | Status: DC | PRN
  Administered 2015-12-17: 50 mg via INTRAVENOUS

## 2015-12-17 MED ORDER — CHLORHEXIDINE GLUCONATE 4 % EX LIQD: 60.0000 mL | Freq: Once | CUTANEOUS | Status: DC

## 2015-12-17 MED ORDER — PROPOFOL 10 MG/ML IV BOLUS
INTRAVENOUS | Status: AC
  Filled 2015-12-17: qty 20

## 2015-12-17 MED ORDER — OXYCODONE-ACETAMINOPHEN 5-325 MG PO TABS: ORAL_TABLET | ORAL | Status: DC

## 2015-12-17 MED ORDER — BUPIVACAINE-EPINEPHRINE (PF) 0.25% -1:200000 IJ SOLN
INTRAMUSCULAR | Status: AC
  Filled 2015-12-17: qty 30

## 2015-12-17 MED ORDER — PROPOFOL 500 MG/50ML IV EMUL
INTRAVENOUS | Status: DC | PRN
  Administered 2015-12-17: 50 ug/kg/min via INTRAVENOUS

## 2015-12-17 MED ORDER — LIDOCAINE 2% (20 MG/ML) 5 ML SYRINGE
INTRAMUSCULAR | Status: AC
  Filled 2015-12-17: qty 5

## 2015-12-17 MED ORDER — FENTANYL CITRATE (PF) 250 MCG/5ML IJ SOLN
INTRAMUSCULAR | Status: AC
  Filled 2015-12-17: qty 5

## 2015-12-17 MED ORDER — LIDOCAINE HCL 1 % IJ SOLN
INTRAMUSCULAR | Status: DC | PRN
  Administered 2015-12-17: 30 mL

## 2015-12-17 MED ORDER — BUPIVACAINE HCL (PF) 0.25 % IJ SOLN
INTRAMUSCULAR | Status: DC | PRN
  Administered 2015-12-17: 30 mL

## 2015-12-17 MED ORDER — BUPIVACAINE HCL (PF) 0.25 % IJ SOLN
INTRAMUSCULAR | Status: AC
Start: 2015-12-17 — End: 2015-12-17
  Filled 2015-12-17: qty 30

## 2015-12-17 MED ORDER — LIDOCAINE HCL (PF) 1 % IJ SOLN
INTRAMUSCULAR | Status: AC
  Filled 2015-12-17: qty 30

## 2015-12-17 MED ORDER — MIDAZOLAM HCL 2 MG/2ML IJ SOLN
INTRAMUSCULAR | Status: AC
  Filled 2015-12-17: qty 2

## 2015-12-17 MED ORDER — LACTATED RINGERS IV SOLN
INTRAVENOUS | Status: DC
  Administered 2015-12-17 (×2): via INTRAVENOUS

## 2015-12-17 SURGICAL SUPPLY — 50 items
BANDAGE COBAN STERILE 2 (GAUZE/BANDAGES/DRESSINGS) IMPLANT
BANDAGE ELASTIC 3 VELCRO ST LF (GAUZE/BANDAGES/DRESSINGS) ×3 IMPLANT
BANDAGE ELASTIC 4 VELCRO ST LF (GAUZE/BANDAGES/DRESSINGS) ×3 IMPLANT
BLADE EAR TYMPAN 2.5 STR BEAV (BLADE) ×3 IMPLANT
BNDG COHESIVE 1X5 TAN STRL LF (GAUZE/BANDAGES/DRESSINGS) ×3 IMPLANT
BNDG CONFORM 2 STRL LF (GAUZE/BANDAGES/DRESSINGS) IMPLANT
BNDG ESMARK 4X9 LF (GAUZE/BANDAGES/DRESSINGS) IMPLANT
BNDG GAUZE ELAST 4 BULKY (GAUZE/BANDAGES/DRESSINGS) ×3 IMPLANT
CONT SPECI 4OZ STER CLIK (MISCELLANEOUS) ×3 IMPLANT
CORDS BIPOLAR (ELECTRODE) ×3 IMPLANT
COVER SURGICAL LIGHT HANDLE (MISCELLANEOUS) ×3 IMPLANT
DECANTER SPIKE VIAL GLASS SM (MISCELLANEOUS) ×3 IMPLANT
DRAIN PENROSE 1/4X12 LTX STRL (WOUND CARE) IMPLANT
DRSG ADAPTIC 3X8 NADH LF (GAUZE/BANDAGES/DRESSINGS) IMPLANT
DRSG EMULSION OIL 3X3 NADH (GAUZE/BANDAGES/DRESSINGS) ×3 IMPLANT
DRSG PAD ABDOMINAL 8X10 ST (GAUZE/BANDAGES/DRESSINGS) ×6 IMPLANT
GAUZE SPONGE 4X4 12PLY STRL (GAUZE/BANDAGES/DRESSINGS) ×3 IMPLANT
GAUZE XEROFORM 1X8 LF (GAUZE/BANDAGES/DRESSINGS) ×3 IMPLANT
GLOVE BIO SURGEON STRL SZ7.5 (GLOVE) ×3 IMPLANT
GLOVE BIOGEL PI IND STRL 8 (GLOVE) ×1 IMPLANT
GLOVE BIOGEL PI INDICATOR 8 (GLOVE) ×2
GOWN STRL REUS W/ TWL LRG LVL3 (GOWN DISPOSABLE) ×1 IMPLANT
GOWN STRL REUS W/TWL LRG LVL3 (GOWN DISPOSABLE) ×2
KIT BASIN OR (CUSTOM PROCEDURE TRAY) ×3 IMPLANT
KIT ROOM TURNOVER OR (KITS) ×3 IMPLANT
LOOP VESSEL MAXI BLUE (MISCELLANEOUS) IMPLANT
MANIFOLD NEPTUNE II (INSTRUMENTS) ×3 IMPLANT
NEEDLE HYPO 25X1 1.5 SAFETY (NEEDLE) IMPLANT
NS IRRIG 1000ML POUR BTL (IV SOLUTION) ×3 IMPLANT
PACK ORTHO EXTREMITY (CUSTOM PROCEDURE TRAY) ×3 IMPLANT
PAD ARMBOARD 7.5X6 YLW CONV (MISCELLANEOUS) ×6 IMPLANT
SCRUB BETADINE 4OZ XXX (MISCELLANEOUS) ×3 IMPLANT
SET CYSTO W/LG BORE CLAMP LF (SET/KITS/TRAYS/PACK) ×3 IMPLANT
SOLUTION BETADINE 4OZ (MISCELLANEOUS) ×3 IMPLANT
SPONGE GAUZE 4X4 12PLY STER LF (GAUZE/BANDAGES/DRESSINGS) ×3 IMPLANT
SPONGE LAP 4X18 X RAY DECT (DISPOSABLE) ×3 IMPLANT
SUT CHROMIC 6 0 PS 4 (SUTURE) ×3 IMPLANT
SUT ETHILON 4 0 P 3 18 (SUTURE) IMPLANT
SUT ETHILON 4 0 PS 2 18 (SUTURE) ×3 IMPLANT
SUT MON AB 5-0 P3 18 (SUTURE) IMPLANT
SYR CONTROL 10ML LL (SYRINGE) IMPLANT
TOWEL OR 17X24 6PK STRL BLUE (TOWEL DISPOSABLE) ×3 IMPLANT
TOWEL OR 17X26 10 PK STRL BLUE (TOWEL DISPOSABLE) ×3 IMPLANT
TUBE ANAEROBIC SPECIMEN COL (MISCELLANEOUS) IMPLANT
TUBE CONNECTING 12'X1/4 (SUCTIONS) ×1
TUBE CONNECTING 12X1/4 (SUCTIONS) ×2 IMPLANT
TUBE FEEDING 5FR 15 INCH (TUBING) IMPLANT
UNDERPAD 30X30 INCONTINENT (UNDERPADS AND DIAPERS) ×3 IMPLANT
WATER STERILE IRR 1000ML POUR (IV SOLUTION) ×3 IMPLANT
YANKAUER SUCT BULB TIP NO VENT (SUCTIONS) ×3 IMPLANT

## 2015-12-17 NOTE — Anesthesia Postprocedure Evaluation (Signed)
Anesthesia Post Note  Patient: [name]  Procedure(s) Performed: Procedure(s) (LRB): NAIL UNIT BIOPSY RIGHT LONG FINGER (Right)  Patient location during evaluation: PACU Anesthesia Type: MAC Level of consciousness: awake and alert Pain management: pain level controlled Vital Signs Assessment: post-procedure vital signs reviewed and stable Respiratory status: spontaneous breathing, nonlabored ventilation, respiratory function stable and patient connected to nasal cannula oxygen Cardiovascular status: stable and blood pressure returned to baseline Anesthetic complications: no    Last Vitals:  Filed Vitals:   12/17/15 1430 12/17/15 1432  BP:  149/85  Pulse:  61  Temp: 36.6 C   Resp:  16    Last Pain:  Filed Vitals:   12/17/15 1437  PainSc: 0-No pain                 Zenaida Deed

## 2015-12-17 NOTE — Anesthesia Preprocedure Evaluation (Addendum)
Anesthesia Evaluation  Patient identified by MRN, date of birth, ID band Patient awake    Reviewed: Allergy & Precautions, NPO status , Patient's Chart, lab work & pertinent test results  Airway Mallampati: I  TM Distance: >3 FB Neck ROM: Full    Dental  (+) Teeth Intact   Pulmonary former smoker,    Pulmonary exam normal breath sounds clear to auscultation       Cardiovascular hypertension, Pt. on medications + CAD, + Cardiac Stents and + CABG   Rhythm:Regular Rate:Normal     Neuro/Psych  Neuromuscular disease    GI/Hepatic   Endo/Other    Renal/GU      Musculoskeletal  (+) Arthritis ,   Abdominal   Peds  Hematology   Anesthesia Other Findings Hx colon CA Mustache and goatee  Reproductive/Obstetrics                         Anesthesia Physical  Anesthesia Plan  ASA: III  Anesthesia Plan: MAC   Post-op Pain Management:    Induction: Intravenous  Airway Management Planned: Nasal Cannula  Additional Equipment:   Intra-op Plan:   Post-operative Plan:   Informed Consent: I have reviewed the patients History and Physical, chart, labs and discussed the procedure including the risks, benefits and alternatives for the proposed anesthesia with the patient or authorized representative who has indicated his/her understanding and acceptance.   Dental advisory given  Plan Discussed with: CRNA, Anesthesiologist and Surgeon  Anesthesia Plan Comments:         Anesthesia Quick Evaluation

## 2015-12-17 NOTE — Brief Op Note (Signed)
12/17/2015  1:47 PM  PATIENT:  Precious Reel  82 y.o. male  PRE-OPERATIVE DIAGNOSIS:  RIGHT LONG FINGER MELANONYCHIA  POST-OPERATIVE DIAGNOSIS:  RIGHT LONG FINGER MELANONYCHIA  PROCEDURE:  Procedure(s): NAIL UNIT BIOPSY RIGHT LONG FINGER (Right)  SURGEON:  Surgeon(s) and Role:    * Leanora Cover, MD - Primary  PHYSICIAN ASSISTANT:   ASSISTANTS: none   ANESTHESIA:   local and MAC  EBL:     BLOOD ADMINISTERED:none  DRAINS: none   LOCAL MEDICATIONS USED:  MARCAINE    and LIDOCAINE   SPECIMEN:  Source of Specimen:  right long finger nail unit  DISPOSITION OF SPECIMEN:  PATHOLOGY  COUNTS:  YES  TOURNIQUET:  * No tourniquets in log *  DICTATION: .Other Dictation: Dictation Number [id number]  PLAN OF CARE: Discharge to home after PACU  PATIENT DISPOSITION:  PACU - hemodynamically stable.

## 2015-12-17 NOTE — H&P (Signed)
[name] is an 82 y.o. male.   Chief Complaint: right long finger melanonychia HPI: 82 yo male with dark streak in right long finger nail for at least a year.  It is not painful.  Allergies:  Allergies  Allergen Reactions  . Dilaudid [Hydromorphone Hcl] Other (See Comments)    Prefers not to have"causes paranoia"  . Statins Other (See Comments)    MYALGIAS  . Fenofibrate Other (See Comments)    Reaction unknown  . Gemfibrozil Other (See Comments)    Reaction unknown  . Red Yeast Rice [Cholestin] Other (See Comments)    Reaction unknown   . Zetia [Ezetimibe] Other (See Comments)    Reaction unknown     Past Medical History  Diagnosis Date  . H/O prostate cancer     Prior cryoablation   . Hyperlipidemia 06/26/2012  . S/P CABG (coronary artery bypass graft)   . Villous adenoma of rectum   . Anxiety   . CAD (coronary artery disease) 06/26/2012    PTCA RCA 1995 CABG w. LIMA to LAD, SVG-D1, SVG-PD-PL 09/1998 Dr. Servando Snare for 3VD; Cypher stent SVG-RCA  2005; redo CABG 07/2005 w/ SVG-OM and SVG-PD-PL;  DR. Wynonia Lawman IS PT'S CARDIOLOGIST  . Memory difficulties   . Hearing loss     bilateral  . Arthritis   . Angina pectoris ([place])   . Other anterior subluxation of left hip, initial encounter [facility])     left hip injections    Past Surgical History  Procedure Laterality Date  . Partial colectomy  2007  . Prostate cryoablation    . Hernia repair    . Nasal septum surgery    . Lumbar laminectomy/decompression microdiscectomy N/A 02/19/2014    Procedure: MICRO LUMBAR DECOMPRESSION L4-5/L3-4;  Surgeon: Johnn Hai, MD;  Location: [facility] ORS;  Service: Orthopedics;  Laterality: N/A;  . Left heart catheterization with coronary/graft angiogram N/A 06/29/2012    Procedure: LEFT HEART CATHETERIZATION WITH Beatrix Fetters;  Surgeon: Sinclair Grooms, MD; DES L main  . Percutaneous coronary stent intervention (pci-s) N/A 07/03/2012    Procedure: PERCUTANEOUS CORONARY STENT  INTERVENTION (PCI-S);  Surgeon: Sinclair Grooms, MD;  Location: [facility] CATH LAB;  Service: Cardiovascular;  Laterality: N/A;  . Left heart catheterization with coronary/graft angiogram N/A 02/28/2013    Procedure: LEFT HEART CATHETERIZATION WITH Beatrix Fetters;  Surgeon: Jacolyn Reedy, MD;  Location: [facility] CATH LAB;  Service: Cardiovascular;  Laterality: N/A;  . Cataract Bilateral     08-13-14  . Total hip arthroplasty Left 09/04/2014    Procedure: LEFT TOTAL HIP ARTHROPLASTY ANTERIOR APPROACH;  Surgeon: Elie Goody, MD;  Location: [facility] ORS;  Service: Orthopedics;  Laterality: Left;  . Back surgery    . Hip surgery    . Coronary artery bypass graft  2000, 2007    LIMA-LAD, SVG-D1, SVG-PDA-PLR March 2000,  SVG to D1 vs OM and SVG-PDA-PL 07/2005  . Colonoscopy      Family History: Family History  Problem Relation Age of Onset  . Prostate cancer Father   . Lung cancer Mother   . CVA Brother   . Brain cancer Daughter     Social History:   reports that he has quit smoking. He has never used smokeless tobacco. He reports that he does not drink alcohol or use illicit drugs.  Medications: Medications Prior to Admission  Medication Sig Dispense Refill  . alfuzosin (UROXATRAL) 10 MG 24 hr tablet Take 10 mg by mouth at bedtime.    [name]  amLODipine (NORVASC) 5 MG tablet Take 5 mg by mouth every morning.     [name] aspirin EC 325 MG tablet Take 1 tablet (325 mg total) by mouth 2 (two) times daily after a meal. (Patient taking differently: Take 325 mg by mouth daily. ) 60 tablet 0  . bimatoprost (LUMIGAN) 0.03 % ophthalmic solution Place 1 drop into both eyes at bedtime.     . cholecalciferol (VITAMIN D) 1000 UNITS tablet Take 1,000 Units by mouth every morning.     . clopidogrel (PLAVIX) 75 MG tablet Take 75 mg by mouth every morning.     . finasteride (PROSCAR) 5 MG tablet Take 5 mg by mouth daily.    [name] GARLIC PO Take 1 tablet by mouth every morning.     . isosorbide mononitrate (IMDUR) 120  MG 24 hr tablet Take 120 mg by mouth daily with lunch.     . memantine (NAMENDA XR) 28 MG CP24 24 hr capsule Take 28 mg by mouth daily.    . metoprolol (LOPRESSOR) 50 MG tablet Take 75 mg by mouth 2 (two) times daily.    . nitroGLYCERIN (NITROSTAT) 0.4 MG SL tablet Place 0.4 mg under the tongue every 5 (five) minutes as needed for chest pain.     [name] OVER THE COUNTER MEDICATION Take 1 capsule by mouth every morning. Mega Red Omega-3 Krill Oil    . ranolazine (RANEXA) 500 MG 12 hr tablet Take 1 tablet (500 mg total) by mouth 2 (two) times daily. 60 tablet 3  . Timolol Maleate (ISTALOL) 0.5 % (DAILY) SOLN Place 1 drop into both eyes every morning.       Results for orders placed or performed during the hospital encounter of 12/15/15 (from the past 48 hour(s))  Basic metabolic panel     Status: Abnormal   Collection Time: 12/15/15 12:39 PM  Result Value Ref Range   Sodium 140 135 - 145 mmol/L   Potassium 4.2 3.5 - 5.1 mmol/L   Chloride 109 101 - 111 mmol/L   CO2 26 22 - 32 mmol/L   Glucose, Bld 112 (H) 65 - 99 mg/dL   BUN 16 6 - 20 mg/dL   Creatinine, Ser 1.34 (H) 0.61 - 1.24 mg/dL   Calcium 8.9 8.9 - 10.3 mg/dL   GFR calc non Af Amer 46 (L) >60 mL/min   GFR calc Af Amer 53 (L) >60 mL/min    Comment: (NOTE) The eGFR has been calculated using the CKD EPI equation. This calculation has not been validated in all clinical situations. eGFR's persistently <60 mL/min signify possible Chronic Kidney Disease.    Anion gap 5 5 - 15  CBC     Status: Abnormal   Collection Time: 12/15/15 12:39 PM  Result Value Ref Range   WBC 9.0 4.0 - 10.5 K/uL   RBC 4.85 4.22 - 5.81 MIL/uL   Hemoglobin 12.8 (L) 13.0 - 17.0 g/dL   HCT 40.6 39.0 - 52.0 %   MCV 83.7 78.0 - 100.0 fL   MCH 26.4 26.0 - 34.0 pg   MCHC 31.5 30.0 - 36.0 g/dL   RDW 15.9 (H) 11.5 - 15.5 %   Platelets 109 (L) 150 - 400 K/uL    Comment: SPECIMEN CHECKED FOR CLOTS PLATELET COUNT CONFIRMED BY SMEAR     No results found.   A  comprehensive review of systems was negative except for: Musculoskeletal: positive for arthralgias  Blood pressure 109/63, pulse 59, temperature 97.8 F (36.6 C), temperature source  Oral, resp. rate 20, weight 74.844 kg (165 lb), SpO2 100 %.  General appearance: alert, cooperative and appears stated age Head: Normocephalic, without obvious abnormality, atraumatic Neck: supple, symmetrical, trachea midline Resp: clear to auscultation bilaterally Cardio: regular rate and rhythm GI: non-tender Extremities: Intact sensation and capillary refill all digits.  +epl/fpl/io.  No wounds.  Pulses: 2+ and symmetric Skin: Skin color, texture, turgor normal. No rashes or lesions Neurologic: Grossly normal Incision/Wound:none  Assessment/Plan Right index finger melanonychia.  Non operative and operative treatment options were discussed with the patient and patient wishes to proceed with operative treatment. Risks, benefits, and alternatives of surgery were discussed and the patient agrees with the plan of care.   Cherilynn Schomburg R 12/17/2015, 11:40 AM

## 2015-12-17 NOTE — Discharge Instructions (Signed)

## 2015-12-17 NOTE — Op Note (Signed)
[id number] 

## 2015-12-17 NOTE — Transfer of Care (Signed)
Immediate Anesthesia Transfer of Care Note  Patient: [name]  Procedure(s) Performed: Procedure(s): NAIL UNIT BIOPSY RIGHT LONG FINGER (Right)  Patient Location: PACU  Anesthesia Type:MAC  Level of Consciousness: awake and patient cooperative  Airway & Oxygen Therapy: Patient Spontanous Breathing and Patient connected to nasal cannula oxygen  Post-op Assessment: Report given to RN, Post -op Vital signs reviewed and stable and Patient moving all extremities  Post vital signs: Reviewed and stable  Last Vitals:  Filed Vitals:   12/17/15 1007 12/17/15 1150  BP: 109/63   Pulse: 59   Temp: 36.6 C 37 C  Resp: 20     Last Pain: There were no vitals filed for this visit.    Patients Stated Pain Goal: 3 (28/36/62 9476)  Complications: No apparent anesthesia complications

## 2015-12-18 ENCOUNTER — Encounter (HOSPITAL_COMMUNITY): Payer: Self-pay | Admitting: Orthopedic Surgery

## 2015-12-18 NOTE — Op Note (Signed)
NAMESORREN, [name] NO.:  [id number]  MEDICAL RECORD NO.:  [id number]  LOCATION:  [facility]                         FACILITY:  [facility]  PHYSICIAN:  Leanora Cover, MD        DATE OF BIRTH:  [birth year]  DATE OF PROCEDURE:  12/17/2015 DATE OF DISCHARGE:  12/17/2015                              OPERATIVE REPORT   PREOPERATIVE DIAGNOSIS:  Right long finger melanonychia  POSTOPERATIVE DIAGNOSIS:  Right long finger melanonychia.  PROCEDURE:  Right long finger nail unit biopsy.  SURGEON:  Leanora Cover, M.D.  ASSISTANTS:  None.  ANESTHESIA:  MAC with local.  IV FLUIDS:  Per anesthesia flow sheet.  ESTIMATED BLOOD LOSS:  Minimal.  COMPLICATIONS:  None.  SPECIMENS:  Right long finger nail unit to Pathology.  TOURNIQUET TIME:  Approximately 25 minutes, Penrose drain to long finger.  DISPOSITION:  Stable to PACU.  INDICATIONS:  Mr. [name] is an 82 year old male who has noted a dark streak in his right long finger.  This is not painful.  We discussed nature of dark streaks in the nails.  I recommended a nail unit biopsy. Risks, benefits, and alternatives of surgery were discussed including the risk of blood loss; infection; damage to nerves, vessels, tendons, ligaments, bone; failure of surgery; need for additional surgery; complications with wound healing; continued pain; need for further procedures if the biopsy shows a malignancy.  He voiced understanding of these risks and elected to proceed.  OPERATIVE COURSE:  After being identified preoperatively by myself, the patient and I agreed upon the procedure and site of the procedure. Surgical site was marked.  The risks, benefits, and alternatives of surgery were reviewed; and he wished to proceed.  Surgical consent had been signed.  He was transferred to the operating room and placed on the operating room table in supine position with the right upper extremity on arm board.  He had been given IV antibiotics as  preoperative antibiotic prophylaxis.  MAC anesthesia was induced by anesthesiologist. The right upper extremity was prepped and draped in normal sterile orthopedic fashion.  Surgical pause was performed between surgeons, Anesthesia, and operating room staff; and all were in agreement as to the patient, procedure, and site of procedure.  A digital block was performed with 10 mL of half and half solution of 1% plain lidocaine and 0.25% plain Marcaine.  This was adequate to give digital anesthesia.  A Penrose drain was used as a tourniquet and was up for approximately 20- 25 minutes.  The fingernail was removed with a Soil scientist.  The dark streak had been marked prior to removing it.  Once the nail plate had been removed, it was noted that significant amount of the dark coloration was in the nail itself.  There was a dark streak in the nail bed still.  A biopsy of the nail unit including the nail fold was taken removing the majority of the dark streak.  At the proximal aspect, there was slight widening of the streaking, not all was able to be removed still allowing for closure of the wound.  The biopsy specimens sent to Pathology for examination.  The wound was copiously irrigated  with sterile saline.  The nail bed remaining was then mobilized with the knife.  It was reapproximated using 6-0 chromic suture in an interrupted fashion.  This provided apposition of all soft tissues.  A piece of Xeroform was placed in nail fold.  The wound was dressed with sterile Xeroform, 4x4s, and wrapped with a Coban dressing lightly.  Alumafoam splint was placed and wrapped lightly with a Coban dressing.  The Penrose drain was removed.  He tolerated the procedure well.  The operative drapes were broken down.  The patient was awoken from anesthesia safely.  He was transferred back to stretcher and taken to PACU in stable condition.  I will see him back in the office in one week for postoperative followup.   I will give him Percocet 5/325, 1-2 p.o. q.6 hours p.r.n. pain, dispensed #20.     Leanora Cover, MD     KK/MEDQ  D:  12/17/2015  T:  12/17/2015  Job:  [id number]

## 2016-05-09 ENCOUNTER — Emergency Department (HOSPITAL_COMMUNITY): Payer: Medicare Other

## 2016-05-09 ENCOUNTER — Observation Stay (HOSPITAL_COMMUNITY)
Admission: EM | Admit: 2016-05-09 | Discharge: 2016-05-11 | Disposition: A | Discharge status: Home / Self Care | Payer: Medicare Other | Attending: General Surgery | Admitting: General Surgery

## 2016-05-09 ENCOUNTER — Encounter (HOSPITAL_COMMUNITY): Payer: Self-pay | Admitting: Emergency Medicine

## 2016-05-09 DIAGNOSIS — D696 Thrombocytopenia, unspecified: Secondary | ICD-10-CM | POA: Diagnosis not present

## 2016-05-09 DIAGNOSIS — S2241XA Multiple fractures of ribs, right side, initial encounter for closed fracture: Principal | ICD-10-CM | POA: Insufficient documentation

## 2016-05-09 DIAGNOSIS — M1612 Unilateral primary osteoarthritis, left hip: Secondary | ICD-10-CM | POA: Diagnosis not present

## 2016-05-09 DIAGNOSIS — E785 Hyperlipidemia, unspecified: Secondary | ICD-10-CM | POA: Insufficient documentation

## 2016-05-09 DIAGNOSIS — D649 Anemia, unspecified: Secondary | ICD-10-CM

## 2016-05-09 DIAGNOSIS — Z885 Allergy status to narcotic agent status: Secondary | ICD-10-CM | POA: Diagnosis not present

## 2016-05-09 DIAGNOSIS — M5135 Other intervertebral disc degeneration, thoracolumbar region: Secondary | ICD-10-CM | POA: Insufficient documentation

## 2016-05-09 DIAGNOSIS — Z951 Presence of aortocoronary bypass graft: Secondary | ICD-10-CM | POA: Insufficient documentation

## 2016-05-09 DIAGNOSIS — Z7982 Long term (current) use of aspirin: Secondary | ICD-10-CM | POA: Insufficient documentation

## 2016-05-09 DIAGNOSIS — Z888 Allergy status to other drugs, medicaments and biological substances status: Secondary | ICD-10-CM | POA: Diagnosis not present

## 2016-05-09 DIAGNOSIS — Z9889 Other specified postprocedural states: Secondary | ICD-10-CM | POA: Insufficient documentation

## 2016-05-09 DIAGNOSIS — Z801 Family history of malignant neoplasm of trachea, bronchus and lung: Secondary | ICD-10-CM | POA: Insufficient documentation

## 2016-05-09 DIAGNOSIS — I25110 Atherosclerotic heart disease of native coronary artery with unstable angina pectoris: Secondary | ICD-10-CM | POA: Insufficient documentation

## 2016-05-09 DIAGNOSIS — Z955 Presence of coronary angioplasty implant and graft: Secondary | ICD-10-CM | POA: Insufficient documentation

## 2016-05-09 DIAGNOSIS — R52 Pain, unspecified: Secondary | ICD-10-CM

## 2016-05-09 DIAGNOSIS — Z8546 Personal history of malignant neoplasm of prostate: Secondary | ICD-10-CM | POA: Diagnosis not present

## 2016-05-09 DIAGNOSIS — M5134 Other intervertebral disc degeneration, thoracic region: Secondary | ICD-10-CM | POA: Insufficient documentation

## 2016-05-09 DIAGNOSIS — Z8042 Family history of malignant neoplasm of prostate: Secondary | ICD-10-CM | POA: Diagnosis not present

## 2016-05-09 DIAGNOSIS — D62 Acute posthemorrhagic anemia: Secondary | ICD-10-CM | POA: Diagnosis not present

## 2016-05-09 DIAGNOSIS — E875 Hyperkalemia: Secondary | ICD-10-CM | POA: Diagnosis not present

## 2016-05-09 DIAGNOSIS — M48061 Spinal stenosis, lumbar region without neurogenic claudication: Secondary | ICD-10-CM | POA: Insufficient documentation

## 2016-05-09 DIAGNOSIS — Z7901 Long term (current) use of anticoagulants: Secondary | ICD-10-CM | POA: Insufficient documentation

## 2016-05-09 DIAGNOSIS — S2249XA Multiple fractures of ribs, unspecified side, initial encounter for closed fracture: Secondary | ICD-10-CM | POA: Diagnosis present

## 2016-05-09 DIAGNOSIS — Z9049 Acquired absence of other specified parts of digestive tract: Secondary | ICD-10-CM | POA: Insufficient documentation

## 2016-05-09 DIAGNOSIS — S2239XA Fracture of one rib, unspecified side, initial encounter for closed fracture: Secondary | ICD-10-CM | POA: Diagnosis present

## 2016-05-09 DIAGNOSIS — I10 Essential (primary) hypertension: Secondary | ICD-10-CM | POA: Insufficient documentation

## 2016-05-09 DIAGNOSIS — Z823 Family history of stroke: Secondary | ICD-10-CM | POA: Insufficient documentation

## 2016-05-09 DIAGNOSIS — Z87891 Personal history of nicotine dependence: Secondary | ICD-10-CM | POA: Insufficient documentation

## 2016-05-09 LAB — I-STAT CG4 LACTIC ACID, ED: LACTIC ACID, VENOUS: 1.81 mmol/L (ref 0.5–1.9)

## 2016-05-09 MED ORDER — TETANUS-DIPHTH-ACELL PERTUSSIS 5-2.5-18.5 LF-MCG/0.5 IM SUSP
0.5000 mL | Freq: Once | INTRAMUSCULAR | Status: AC
  Administered 2016-05-09: 0.5 mL via INTRAMUSCULAR
  Filled 2016-05-09: qty 0.5

## 2016-05-09 MED ORDER — IBUPROFEN 800 MG PO TABS
800.0000 mg | ORAL_TABLET | Freq: Once | ORAL | Status: AC
  Administered 2016-05-09: 800 mg via ORAL
  Filled 2016-05-09: qty 1

## 2016-05-09 MED ORDER — FENTANYL CITRATE (PF) 100 MCG/2ML IJ SOLN
50.0000 ug | Freq: Once | INTRAMUSCULAR | Status: AC
  Administered 2016-05-09: 50 ug via INTRAVENOUS
  Filled 2016-05-09: qty 2
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
## 2016-05-09 NOTE — ED Notes (Signed)
Delay in lab draw edp examining pt.

## 2016-05-09 NOTE — ED Notes (Signed)
Pt taken to xray 

## 2016-05-09 NOTE — ED Notes (Signed)
MD at [place]

## 2016-05-09 NOTE — ED Notes (Signed)
Dr. Wakefield at bedside. 

## 2016-05-10 ENCOUNTER — Emergency Department (HOSPITAL_COMMUNITY): Payer: Medicare Other

## 2016-05-10 ENCOUNTER — Encounter (HOSPITAL_COMMUNITY): Payer: Self-pay | Admitting: General Practice

## 2016-05-10 DIAGNOSIS — S2239XA Fracture of one rib, unspecified side, initial encounter for closed fracture: Secondary | ICD-10-CM | POA: Diagnosis present

## 2016-05-10 DIAGNOSIS — D62 Acute posthemorrhagic anemia: Secondary | ICD-10-CM | POA: Diagnosis not present

## 2016-05-10 DIAGNOSIS — S2249XA Multiple fractures of ribs, unspecified side, initial encounter for closed fracture: Secondary | ICD-10-CM | POA: Diagnosis present

## 2016-05-10 DIAGNOSIS — S2241XA Multiple fractures of ribs, right side, initial encounter for closed fracture: Secondary | ICD-10-CM | POA: Diagnosis not present

## 2016-05-10 LAB — COMPREHENSIVE METABOLIC PANEL
ALK PHOS: 52 U/L (ref 38–126)
ALT: 14 U/L — AB (ref 17–63)
AST: 23 U/L (ref 15–41)
Albumin: 3.1 g/dL — ABNORMAL LOW (ref 3.5–5.0)
Anion gap: 7 (ref 5–15)
BUN: 19 mg/dL (ref 6–20)
CALCIUM: 7.7 mg/dL — AB (ref 8.9–10.3)
CHLORIDE: 117 mmol/L — AB (ref 101–111)
CO2: 19 mmol/L — AB (ref 22–32)
CREATININE: 1.41 mg/dL — AB (ref 0.61–1.24)
GFR calc Af Amer: 50 mL/min — ABNORMAL LOW (ref >60)
GFR calc non Af Amer: 43 mL/min — ABNORMAL LOW (ref >60)
GLUCOSE: 150 mg/dL — AB (ref 65–99)
Potassium: 3.1 mmol/L — ABNORMAL LOW (ref 3.5–5.1)
SODIUM: 143 mmol/L (ref 135–145)
Total Bilirubin: 0.8 mg/dL (ref 0.3–1.2)
Total Protein: 4.8 g/dL — ABNORMAL LOW (ref 6.5–8.1)

## 2016-05-10 LAB — PROTIME-INR
INR: 1.69
PROTHROMBIN TIME: 20.1 s — AB (ref 11.4–15.2)

## 2016-05-10 LAB — CBC WITH DIFFERENTIAL/PLATELET
BASOS PCT: 0 %
Basophils Absolute: 0 10*3/uL (ref 0.0–0.1)
EOS ABS: 0.1 10*3/uL (ref 0.0–0.7)
EOS PCT: 1 %
HCT: 32.9 % — ABNORMAL LOW (ref 39.0–52.0)
HEMOGLOBIN: 10.6 g/dL — AB (ref 13.0–17.0)
LYMPHS ABS: 0.7 10*3/uL (ref 0.7–4.0)
Lymphocytes Relative: 6 %
MCH: 26.6 pg (ref 26.0–34.0)
MCHC: 32.2 g/dL (ref 30.0–36.0)
MCV: 82.5 fL (ref 78.0–100.0)
MONO ABS: 1.9 10*3/uL — AB (ref 0.1–1.0)
MONOS PCT: 16 %
Neutro Abs: 9.6 10*3/uL — ABNORMAL HIGH (ref 1.7–7.7)
Neutrophils Relative %: 78 %
PLATELETS: 94 10*3/uL — AB (ref 150–400)
RBC: 3.99 MIL/uL — ABNORMAL LOW (ref 4.22–5.81)
RDW: 16.7 % — AB (ref 11.5–15.5)
WBC: 12.4 10*3/uL — ABNORMAL HIGH (ref 4.0–10.5)

## 2016-05-10 MED ORDER — ONDANSETRON HCL 4 MG PO TABS: 4.0000 mg | ORAL_TABLET | Freq: Four times a day (QID) | ORAL | Status: DC | PRN

## 2016-05-10 MED ORDER — OXYCODONE HCL 5 MG PO TABS
5.0000 mg | ORAL_TABLET | ORAL | Status: DC | PRN
  Administered 2016-05-10: 5 mg via ORAL
  Filled 2016-05-10: qty 1

## 2016-05-10 MED ORDER — METOPROLOL TARTRATE 25 MG PO TABS
75.0000 mg | ORAL_TABLET | Freq: Two times a day (BID) | ORAL | Status: DC
  Administered 2016-05-10 – 2016-05-11 (×2): 75 mg via ORAL
  Filled 2016-05-10 (×2): qty 1

## 2016-05-10 MED ORDER — NITROGLYCERIN 0.4 MG SL SUBL: 0.4000 mg | SUBLINGUAL_TABLET | SUBLINGUAL | Status: DC | PRN

## 2016-05-10 MED ORDER — RANOLAZINE ER 500 MG PO TB12
500.0000 mg | ORAL_TABLET | Freq: Two times a day (BID) | ORAL | Status: DC
  Administered 2016-05-10 – 2016-05-11 (×2): 500 mg via ORAL
  Filled 2016-05-10 (×2): qty 1

## 2016-05-10 MED ORDER — ISOSORBIDE MONONITRATE ER 60 MG PO TB24
120.0000 mg | ORAL_TABLET | Freq: Every day | ORAL | Status: DC
  Administered 2016-05-10 – 2016-05-11 (×2): 120 mg via ORAL
  Filled 2016-05-10 (×2): qty 2

## 2016-05-10 MED ORDER — TIMOLOL MALEATE 0.5 % OP SOLG
1.0000 [drp] | Freq: Every morning | OPHTHALMIC | Status: DC
  Administered 2016-05-10 – 2016-05-11 (×2): 1 [drp] via OPHTHALMIC
  Filled 2016-05-10: qty 5

## 2016-05-10 MED ORDER — OXYCODONE HCL 5 MG PO TABS: 10.0000 mg | ORAL_TABLET | ORAL | Status: DC | PRN

## 2016-05-10 MED ORDER — DOCUSATE SODIUM 100 MG PO CAPS
100.0000 mg | ORAL_CAPSULE | Freq: Two times a day (BID) | ORAL | Status: DC
  Administered 2016-05-10 – 2016-05-11 (×3): 100 mg via ORAL
  Filled 2016-05-10 (×3): qty 1

## 2016-05-10 MED ORDER — VITAMIN D 1000 UNITS PO TABS
1000.0000 [IU] | ORAL_TABLET | Freq: Every morning | ORAL | Status: DC
  Administered 2016-05-11: 1000 [IU] via ORAL
  Filled 2016-05-10: qty 1

## 2016-05-10 MED ORDER — ALFUZOSIN HCL ER 10 MG PO TB24
10.0000 mg | ORAL_TABLET | Freq: Every day | ORAL | Status: DC
  Administered 2016-05-10: 10 mg via ORAL
  Filled 2016-05-10: qty 1

## 2016-05-10 MED ORDER — ASPIRIN EC 325 MG PO TBEC
325.0000 mg | DELAYED_RELEASE_TABLET | Freq: Every day | ORAL | Status: DC
  Administered 2016-05-10 – 2016-05-11 (×2): 325 mg via ORAL
  Filled 2016-05-10 (×2): qty 1

## 2016-05-10 MED ORDER — FINASTERIDE 5 MG PO TABS
5.0000 mg | ORAL_TABLET | Freq: Every day | ORAL | Status: DC
  Administered 2016-05-10 – 2016-05-11 (×2): 5 mg via ORAL
  Filled 2016-05-10 (×2): qty 1

## 2016-05-10 MED ORDER — AMLODIPINE BESYLATE 5 MG PO TABS
5.0000 mg | ORAL_TABLET | Freq: Every morning | ORAL | Status: DC
  Administered 2016-05-10 – 2016-05-11 (×2): 5 mg via ORAL
  Filled 2016-05-10 (×2): qty 1

## 2016-05-10 MED ORDER — SODIUM CHLORIDE 0.9 % IV SOLN
INTRAVENOUS | Status: DC
  Administered 2016-05-10: 03:00:00 via INTRAVENOUS

## 2016-05-10 MED ORDER — ENOXAPARIN SODIUM 40 MG/0.4ML ~~LOC~~ SOLN
40.0000 mg | SUBCUTANEOUS | Status: DC
  Administered 2016-05-10 – 2016-05-11 (×2): 40 mg via SUBCUTANEOUS
  Filled 2016-05-10 (×2): qty 0.4

## 2016-05-10 MED ORDER — MORPHINE SULFATE (PF) 2 MG/ML IV SOLN: 2.0000 mg | INTRAVENOUS | Status: DC | PRN

## 2016-05-10 MED ORDER — POLYETHYLENE GLYCOL 3350 17 G PO PACK
17.0000 g | PACK | Freq: Every day | ORAL | Status: DC
  Administered 2016-05-10 – 2016-05-11 (×2): 17 g via ORAL
  Filled 2016-05-10 (×2): qty 1

## 2016-05-10 MED ORDER — ONDANSETRON HCL 4 MG/2ML IJ SOLN: 4.0000 mg | Freq: Four times a day (QID) | INTRAMUSCULAR | Status: DC | PRN

## 2016-05-10 MED ORDER — MORPHINE SULFATE (PF) 2 MG/ML IV SOLN
2.0000 mg | INTRAVENOUS | Status: DC | PRN
  Administered 2016-05-10: 2 mg via INTRAVENOUS
  Filled 2016-05-10: qty 1

## 2016-05-10 MED ORDER — MEMANTINE HCL ER 28 MG PO CP24
28.0000 mg | ORAL_CAPSULE | Freq: Every day | ORAL | Status: DC
  Administered 2016-05-10 – 2016-05-11 (×2): 28 mg via ORAL
  Filled 2016-05-10 (×2): qty 1

## 2016-05-10 MED ORDER — ACETAMINOPHEN 325 MG PO TABS: 650.0000 mg | ORAL_TABLET | ORAL | Status: DC | PRN

## 2016-05-10 MED ORDER — CLOPIDOGREL BISULFATE 75 MG PO TABS
75.0000 mg | ORAL_TABLET | Freq: Every morning | ORAL | Status: DC
  Administered 2016-05-10 – 2016-05-11 (×2): 75 mg via ORAL
  Filled 2016-05-10 (×2): qty 1

## 2016-05-10 MED ORDER — LATANOPROST 0.005 % OP SOLN
1.0000 [drp] | Freq: Every day | OPHTHALMIC | Status: DC
Start: 2016-05-10 — End: 2016-05-11
  Administered 2016-05-10: 1 [drp] via OPHTHALMIC
  Filled 2016-05-10: qty 2.5

## 2016-05-10 MED ORDER — TRAMADOL HCL 50 MG PO TABS
50.0000 mg | ORAL_TABLET | Freq: Four times a day (QID) | ORAL | Status: DC | PRN
  Administered 2016-05-10 – 2016-05-11 (×4): 100 mg via ORAL
  Filled 2016-05-10 (×4): qty 2

## 2016-05-10 NOTE — Progress Notes (Signed)
Most meds still not verified by pharmacy, reminded.

## 2016-05-10 NOTE — Progress Notes (Signed)
Patient left unit to visit his wife at 2S, accompanied by his family. MD made aware.
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
## 2016-05-11 DIAGNOSIS — S2249XA Multiple fractures of ribs, unspecified side, initial encounter for closed fracture: Secondary | ICD-10-CM | POA: Diagnosis present

## 2016-05-11 DIAGNOSIS — S2241XA Multiple fractures of ribs, right side, initial encounter for closed fracture: Secondary | ICD-10-CM | POA: Diagnosis not present

## 2016-05-11 LAB — BASIC METABOLIC PANEL
ANION GAP: 7 (ref 5–15)
BUN: 12 mg/dL (ref 6–20)
CHLORIDE: 113 mmol/L — AB (ref 101–111)
CO2: 21 mmol/L — AB (ref 22–32)
Calcium: 8.2 mg/dL — ABNORMAL LOW (ref 8.9–10.3)
Creatinine, Ser: 1.17 mg/dL (ref 0.61–1.24)
GFR calc non Af Amer: 54 mL/min — ABNORMAL LOW (ref >60)
GLUCOSE: 153 mg/dL — AB (ref 65–99)
POTASSIUM: 3.8 mmol/L (ref 3.5–5.1)
Sodium: 141 mmol/L (ref 135–145)

## 2016-05-11 LAB — CBC
HEMATOCRIT: 31.9 % — AB (ref 39.0–52.0)
HEMOGLOBIN: 10 g/dL — AB (ref 13.0–17.0)
MCH: 25.6 pg — ABNORMAL LOW (ref 26.0–34.0)
MCHC: 31.3 g/dL (ref 30.0–36.0)
MCV: 81.8 fL (ref 78.0–100.0)
Platelets: 97 10*3/uL — ABNORMAL LOW (ref 150–400)
RBC: 3.9 MIL/uL — AB (ref 4.22–5.81)
RDW: 16.6 % — ABNORMAL HIGH (ref 11.5–15.5)
WBC: 10 10*3/uL (ref 4.0–10.5)

## 2016-05-11 MED ORDER — TRAMADOL HCL 50 MG PO TABS: 50.0000 mg | ORAL_TABLET | Freq: Four times a day (QID) | ORAL | 0 refills | Status: DC | PRN
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
## 2016-05-11 NOTE — Care Management Obs Status (Signed)
[place] NOTIFICATION   Patient Details  Name: [name] MRN: [id number] Date of Birth: 13-Jun-1927   Medicare Observation Status Notification Given:       Ella Bodo, RN 05/11/2016, 11:36 AM

## 2016-05-11 NOTE — Discharge Instructions (Signed)

## 2016-05-11 NOTE — Progress Notes (Signed)
OT addendum : G Codes    05/11/16 1143  OT G-codes **NOT FOR INPATIENT CLASS**  Functional Assessment Tool Used Clinical Judgement  Functional Limitation Self care  Self Care Current Status (M3536) CI  Self Care Goal Status (R4431) CI  Self Care Discharge Status (V4008) CI   Pt switched from inpatient to observation status.  Hulda Humphrey OTR/L [phone]

## 2016-05-11 NOTE — Care Management CC44 (Signed)
Condition Code 44 Documentation Completed  Patient Details  Name: [name] MRN: [id number] Date of Birth: 12/20/1926   Condition Code 44 given:    Patient signature on Condition Code 44 notice:    Documentation of 2 MD's agreement:    Code 44 added to claim:       Ella Bodo, RN 05/11/2016, 11:36 AM

## 2016-05-11 NOTE — Progress Notes (Signed)
Patient discharged to home with instructions and prescriptions given to family.
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
## 2016-05-11 NOTE — Progress Notes (Signed)
   05/11/16 1300  Clinical Encounter Type  Visited With Patient  Visit Type Initial;Spiritual support  Referral From Chaplain  Consult/Referral To Chaplain  Spiritual Encounters  Spiritual Needs Prayer  Stress Factors  Patient Stress Factors Health changes    Chaplain followed up on referral from colleague. Pt in good spirits offered prayer and emotional support.

## 2016-05-12 NOTE — Progress Notes (Signed)
Late entry for missed g code 05/10/16:   2016/06/02 1500  PT G-Codes **NOT FOR INPATIENT CLASS**  Functional Assessment Tool Used clinical judgement  Functional Limitation Changing and maintaining body position  Changing and Maintaining Body Position Current Status (L0786) CI  Changing and Maintaining Body Position Goal Status [phone]) [name],PT Acute Rehabilitation [phone] (pager)
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
## 2017-08-24 ENCOUNTER — Other Ambulatory Visit: Payer: Self-pay | Admitting: Internal Medicine

## 2017-08-24 DIAGNOSIS — R443 Hallucinations, unspecified: Secondary | ICD-10-CM

## 2017-08-24 DIAGNOSIS — R918 Other nonspecific abnormal finding of lung field: Secondary | ICD-10-CM

## 2017-09-05 ENCOUNTER — Ambulatory Visit
Admission: RE | Admit: 2017-09-05 | Discharge: 2017-09-05 | Disposition: A | Discharge status: Home / Self Care | Payer: Medicare Other | Source: Ambulatory Visit | Attending: Internal Medicine | Admitting: Internal Medicine

## 2017-09-05 DIAGNOSIS — R443 Hallucinations, unspecified: Secondary | ICD-10-CM

## 2017-09-05 DIAGNOSIS — R918 Other nonspecific abnormal finding of lung field: Secondary | ICD-10-CM

## 2017-09-05 MED ORDER — IOPAMIDOL (ISOVUE-300) INJECTION 61%
75.0000 mL | Freq: Once | INTRAVENOUS | Status: AC | PRN
  Administered 2017-09-05: 75 mL via INTRAVENOUS

## 2017-09-11 ENCOUNTER — Other Ambulatory Visit (HOSPITAL_COMMUNITY): Payer: Self-pay | Admitting: Internal Medicine

## 2017-09-11 DIAGNOSIS — R918 Other nonspecific abnormal finding of lung field: Secondary | ICD-10-CM

## 2017-09-19 ENCOUNTER — Ambulatory Visit (HOSPITAL_COMMUNITY): Payer: Medicare Other

## 2017-09-29 ENCOUNTER — Ambulatory Visit (HOSPITAL_COMMUNITY)
Admission: RE | Admit: 2017-09-29 | Discharge: 2017-09-29 | Disposition: A | Discharge status: Home / Self Care | Payer: Medicare Other | Source: Ambulatory Visit | Attending: Internal Medicine | Admitting: Internal Medicine

## 2017-09-29 DIAGNOSIS — Z951 Presence of aortocoronary bypass graft: Secondary | ICD-10-CM | POA: Insufficient documentation

## 2017-09-29 DIAGNOSIS — R918 Other nonspecific abnormal finding of lung field: Secondary | ICD-10-CM | POA: Diagnosis present

## 2017-09-29 DIAGNOSIS — I700 Atherosclerosis of aorta: Secondary | ICD-10-CM | POA: Insufficient documentation

## 2017-09-29 DIAGNOSIS — N200 Calculus of kidney: Secondary | ICD-10-CM | POA: Insufficient documentation

## 2017-09-29 LAB — GLUCOSE, CAPILLARY: Glucose-Capillary: 85 mg/dL (ref 65–99)

## 2017-09-29 MED ORDER — FLUDEOXYGLUCOSE F - 18 (FDG) INJECTION
8.4000 | Freq: Once | INTRAVENOUS | Status: AC | PRN
  Administered 2017-09-29: 8.4 via INTRAVENOUS

## 2018-03-09 ENCOUNTER — Other Ambulatory Visit: Payer: Self-pay | Admitting: Internal Medicine

## 2018-03-09 DIAGNOSIS — C3490 Malignant neoplasm of unspecified part of unspecified bronchus or lung: Secondary | ICD-10-CM

## 2018-03-22 ENCOUNTER — Ambulatory Visit
Admission: RE | Admit: 2018-03-22 | Discharge: 2018-03-22 | Disposition: A | Discharge status: Home / Self Care | Payer: Medicare Other | Source: Ambulatory Visit | Attending: Internal Medicine | Admitting: Internal Medicine

## 2018-03-22 DIAGNOSIS — C3490 Malignant neoplasm of unspecified part of unspecified bronchus or lung: Secondary | ICD-10-CM

## 2018-03-22 MED ORDER — IOPAMIDOL (ISOVUE-300) INJECTION 61%
75.0000 mL | Freq: Once | INTRAVENOUS | Status: AC | PRN
  Administered 2018-03-22: 50 mL via INTRAVENOUS

## 2018-05-09 ENCOUNTER — Telehealth: Payer: Self-pay | Admitting: Cardiology

## 2018-05-09 NOTE — Telephone Encounter (Signed)
done

## 2018-07-13 ENCOUNTER — Telehealth: Payer: Self-pay | Admitting: Cardiology

## 2018-07-13 MED ORDER — METOPROLOL TARTRATE 50 MG PO TABS: 75.0000 mg | ORAL_TABLET | Freq: Two times a day (BID) | ORAL | 0 refills | Status: DC

## 2018-07-13 NOTE — Telephone Encounter (Signed)
30 day supply of metoprolol tartrate sent to [facility] in [place] with no refills. Patient is overdue for a follow up appointment. Will notify our front desk to contact patient to schedule an appointment.

## 2018-07-13 NOTE — Telephone Encounter (Signed)
° °  1. Which medications need to be refilled? (please list name of each medication and dose if known) Metoprolol tartrate 50mg  tablet  2. Which pharmacy/location (including street and city if local pharmacy) is medication to be sent to?[facility] [phone]  3. Do they need a 30 day or 90 day supply? [place]

## 2018-08-10 ENCOUNTER — Other Ambulatory Visit: Payer: Self-pay | Admitting: Cardiology

## 2018-10-27 IMAGING — CT NM PET TUM IMG INITIAL (PI) SKULL BASE T - THIGH
1 of 8 series · 1 of 25 positions shown · non-contrast
Comparison: CT chest 09/05/2017.

CLINICAL DATA: Initial treatment strategy for lung mass.

EXAM:
NUCLEAR MEDICINE PET SKULL BASE TO THIGH
TECHNIQUE: 8.4 mCi F-18 FDG was injected intravenously. Full-ring PET imaging
was performed from the skull base to thigh after the radiotracer. CT
data was obtained and used for attenuation correction and anatomic
localization.
Fasting blood glucose: 85 mg/dl

[Series 4: ct sk_thigh 5.0 b31f · axial · 5.0mm · 0.98mm/px · 1 of 234 slices shown]
[im 234/234  brain]
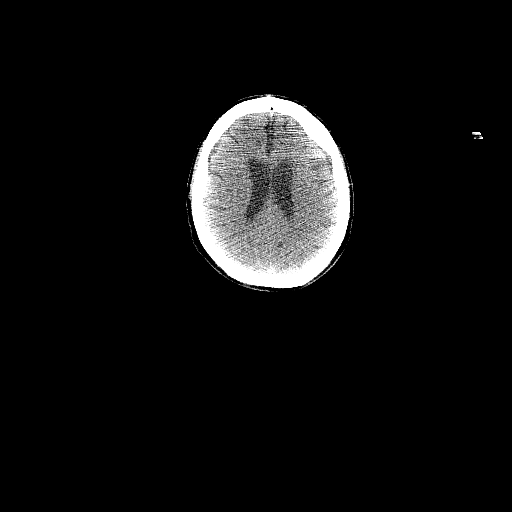

[1 of 25 positions shown; findings below may reference images not displayed]

FINDINGS: Mediastinal blood pool activity: SUV max

NECK: No hypermetabolic lymph nodes in the neck.

Incidental CT findings: none

CHEST: No hypermetabolic mediastinal or hilar nodes.

The pulmonary nodule within the posterior left upper lobe measures
1.9 cm, image [DATE]. The SUV max associated with this lesion is equal
to 2.8. No additional pulmonary nodules identified.

Incidental CT findings: Aortic atherosclerosis. Previous CABG
procedure.

ABDOMEN/PELVIS: No abnormal hypermetabolic activity within the
liver, pancreas, adrenal glands, or spleen. No hypermetabolic lymph
nodes in the abdomen or pelvis.

Incidental CT findings: Small stone within the inferior pole of the
right kidney noted. Aortic atherosclerosis identified. No aneurysm.

SKELETON: There is heterogeneous radiotracer uptake identified
throughout the axial and appendicular skeleton. No focal areas of
increased radiotracer activity highly suspicious for hypermetabolic
bone metastasis.

Incidental CT findings: Previous left hip arthroplasty. Spondylosis
is identified throughout the thoracic and lumbar spine. Previous
median sternotomy.
IMPRESSION: 1. There is mild increased uptake associated with the left upper
lobe pulmonary nodule with an SUV max equal to 2.8. Findings are
suspicious for small pulmonary neoplasm. Consider correlation with
tissue sampling.
2.  Aortic Atherosclerosis (WCG41-LO6.6).  Previous CABG procedure.
3. Right kidney stone.

## 2019-04-04 DEATH — deceased
